# Patient Record
Sex: Male | Born: 1960 | Race: Black or African American | Hispanic: No | Marital: Single | Smoking: Current every day smoker
Health system: Southern US, Community
[De-identification: ages and names within clinical notes are randomized; demographics above are authoritative.]

## PROBLEM LIST (undated history)

## (undated) DIAGNOSIS — G4730 Sleep apnea, unspecified: Secondary | ICD-10-CM

## (undated) DIAGNOSIS — R0601 Orthopnea: Secondary | ICD-10-CM

## (undated) DIAGNOSIS — IMO0001 Reserved for inherently not codable concepts without codable children: Secondary | ICD-10-CM

## (undated) DIAGNOSIS — R609 Edema, unspecified: Secondary | ICD-10-CM

## (undated) DIAGNOSIS — R05 Cough: Secondary | ICD-10-CM

## (undated) DIAGNOSIS — R062 Wheezing: Secondary | ICD-10-CM

## (undated) DIAGNOSIS — M1990 Unspecified osteoarthritis, unspecified site: Secondary | ICD-10-CM

## (undated) DIAGNOSIS — Z972 Presence of dental prosthetic device (complete) (partial): Secondary | ICD-10-CM

## (undated) DIAGNOSIS — M4322 Fusion of spine, cervical region: Secondary | ICD-10-CM

## (undated) DIAGNOSIS — G510 Bell's palsy: Secondary | ICD-10-CM

## (undated) DIAGNOSIS — H9190 Unspecified hearing loss, unspecified ear: Secondary | ICD-10-CM

## (undated) DIAGNOSIS — I10 Essential (primary) hypertension: Secondary | ICD-10-CM

## (undated) DIAGNOSIS — E119 Type 2 diabetes mellitus without complications: Secondary | ICD-10-CM

## (undated) DIAGNOSIS — N400 Enlarged prostate without lower urinary tract symptoms: Secondary | ICD-10-CM

## (undated) DIAGNOSIS — I639 Cerebral infarction, unspecified: Secondary | ICD-10-CM

## (undated) DIAGNOSIS — J45909 Unspecified asthma, uncomplicated: Secondary | ICD-10-CM

## (undated) DIAGNOSIS — R059 Cough, unspecified: Secondary | ICD-10-CM

## (undated) HISTORY — PX: BACK SURGERY: SHX140

## (undated) HISTORY — PX: PILONIDAL CYST EXCISION: SHX744

## (undated) HISTORY — PX: NECK MASS EXCISION: SHX2079

## (undated) HISTORY — PX: COLONOSCOPY: SHX174

---

## 2010-12-10 DIAGNOSIS — R079 Chest pain, unspecified: Secondary | ICD-10-CM | POA: Insufficient documentation

## 2013-07-14 ENCOUNTER — Ambulatory Visit: Payer: Self-pay | Admitting: Family Medicine

## 2014-05-23 DIAGNOSIS — N529 Male erectile dysfunction, unspecified: Secondary | ICD-10-CM | POA: Insufficient documentation

## 2014-05-23 DIAGNOSIS — N138 Other obstructive and reflux uropathy: Secondary | ICD-10-CM | POA: Insufficient documentation

## 2014-05-23 DIAGNOSIS — R972 Elevated prostate specific antigen [PSA]: Secondary | ICD-10-CM | POA: Insufficient documentation

## 2015-11-29 ENCOUNTER — Emergency Department: Payer: Medicaid Other

## 2015-11-29 ENCOUNTER — Encounter: Payer: Self-pay | Admitting: Emergency Medicine

## 2015-11-29 ENCOUNTER — Emergency Department
Admission: EM | Admit: 2015-11-29 | Discharge: 2015-11-29 | Disposition: A | Discharge status: Home / Self Care | Payer: Medicaid Other | Attending: Emergency Medicine | Admitting: Emergency Medicine

## 2015-11-29 DIAGNOSIS — Y9389 Activity, other specified: Secondary | ICD-10-CM | POA: Insufficient documentation

## 2015-11-29 DIAGNOSIS — Y999 Unspecified external cause status: Secondary | ICD-10-CM | POA: Diagnosis not present

## 2015-11-29 DIAGNOSIS — E119 Type 2 diabetes mellitus without complications: Secondary | ICD-10-CM | POA: Insufficient documentation

## 2015-11-29 DIAGNOSIS — T07XXXA Unspecified multiple injuries, initial encounter: Secondary | ICD-10-CM

## 2015-11-29 DIAGNOSIS — S0990XA Unspecified injury of head, initial encounter: Secondary | ICD-10-CM | POA: Diagnosis present

## 2015-11-29 DIAGNOSIS — F17210 Nicotine dependence, cigarettes, uncomplicated: Secondary | ICD-10-CM | POA: Diagnosis not present

## 2015-11-29 DIAGNOSIS — T148 Other injury of unspecified body region: Secondary | ICD-10-CM | POA: Insufficient documentation

## 2015-11-29 DIAGNOSIS — Y929 Unspecified place or not applicable: Secondary | ICD-10-CM | POA: Insufficient documentation

## 2015-11-29 DIAGNOSIS — I10 Essential (primary) hypertension: Secondary | ICD-10-CM | POA: Insufficient documentation

## 2015-11-29 DIAGNOSIS — W07XXXA Fall from chair, initial encounter: Secondary | ICD-10-CM | POA: Insufficient documentation

## 2015-11-29 HISTORY — DX: Type 2 diabetes mellitus without complications: E11.9

## 2015-11-29 HISTORY — DX: Essential (primary) hypertension: I10

## 2015-11-29 LAB — CBC WITH DIFFERENTIAL/PLATELET
BASOS ABS: 0 10*3/uL (ref 0–0.1)
Basophils Relative: 1 %
Eosinophils Absolute: 0.2 10*3/uL (ref 0–0.7)
Eosinophils Relative: 4 %
HEMATOCRIT: 38.4 % — AB (ref 40.0–52.0)
HEMOGLOBIN: 13 g/dL (ref 13.0–18.0)
LYMPHS PCT: 36 %
Lymphs Abs: 2 10*3/uL (ref 1.0–3.6)
MCH: 29.9 pg (ref 26.0–34.0)
MCHC: 33.8 g/dL (ref 32.0–36.0)
MCV: 88.5 fL (ref 80.0–100.0)
MONO ABS: 0.8 10*3/uL (ref 0.2–1.0)
Monocytes Relative: 14 %
NEUTROS ABS: 2.6 10*3/uL (ref 1.4–6.5)
NEUTROS PCT: 45 %
Platelets: 198 10*3/uL (ref 150–440)
RBC: 4.34 MIL/uL — ABNORMAL LOW (ref 4.40–5.90)
RDW: 14.4 % (ref 11.5–14.5)
WBC: 5.6 10*3/uL (ref 3.8–10.6)

## 2015-11-29 LAB — COMPREHENSIVE METABOLIC PANEL
ALT: 28 U/L (ref 17–63)
AST: 24 U/L (ref 15–41)
Albumin: 3.8 g/dL (ref 3.5–5.0)
Alkaline Phosphatase: 59 U/L (ref 38–126)
Anion gap: 6 (ref 5–15)
BILIRUBIN TOTAL: 0.6 mg/dL (ref 0.3–1.2)
BUN: 11 mg/dL (ref 6–20)
CO2: 27 mmol/L (ref 22–32)
CREATININE: 1.08 mg/dL (ref 0.61–1.24)
Calcium: 9.7 mg/dL (ref 8.9–10.3)
Chloride: 105 mmol/L (ref 101–111)
GFR calc Af Amer: >60 mL/min (ref >60)
Glucose, Bld: 79 mg/dL (ref 65–99)
Potassium: 4.7 mmol/L (ref 3.5–5.1)
Sodium: 138 mmol/L (ref 135–145)
TOTAL PROTEIN: 7.5 g/dL (ref 6.5–8.1)

## 2015-11-29 LAB — URINALYSIS COMPLETE WITH MICROSCOPIC (ARMC ONLY)
BILIRUBIN URINE: NEGATIVE
Bacteria, UA: NONE SEEN
GLUCOSE, UA: NEGATIVE mg/dL
Hgb urine dipstick: NEGATIVE
Ketones, ur: NEGATIVE mg/dL
Leukocytes, UA: NEGATIVE
NITRITE: NEGATIVE
Protein, ur: NEGATIVE mg/dL
RBC / HPF: NONE SEEN RBC/hpf (ref 0–5)
SPECIFIC GRAVITY, URINE: 1.014 (ref 1.005–1.030)
Squamous Epithelial / LPF: NONE SEEN
pH: 6 (ref 5.0–8.0)

## 2015-11-29 LAB — LIPASE, BLOOD: Lipase: 19 U/L (ref 11–51)

## 2015-11-29 MED ORDER — OXYCODONE-ACETAMINOPHEN 5-325 MG PO TABS
1.0000 | ORAL_TABLET | Freq: Once | ORAL | Status: AC
  Administered 2015-11-29: 1 via ORAL

## 2015-11-29 MED ORDER — OXYCODONE-ACETAMINOPHEN 5-325 MG PO TABS: 1.0000 | ORAL_TABLET | Freq: Four times a day (QID) | ORAL | Status: DC | PRN

## 2015-11-29 MED ORDER — OXYCODONE-ACETAMINOPHEN 5-325 MG PO TABS
ORAL_TABLET | ORAL | Status: AC
  Administered 2015-11-29: 1 via ORAL
  Filled 2015-11-29: qty 1

## 2015-11-29 NOTE — ED Provider Notes (Signed)
[facility], [facility] Emergency Department Provider Note   ____________________________________________  Time seen: Approximately 6:20 PM  I have reviewed the triage vital signs and the nursing notes.   HISTORY  Chief Complaint Fall and Chest Pain    HPI Juan Harper is a 55 y.o. male who reports this morning about 10 or 11:00 he was sitting in a chair to chair broke and fell apart and he fell down and hit his head on the refrigerator. She complains of a headache she reports she has chronic neck and back pain and patient nerves. This is all somewhat worse. He also complains of pain in the right side of his chest since the fall hurts to push on his chest. He also complains of pain in the right upper quadrant of his abdomen since the fall. He has a history of hypertension diabetes as well. He is not short of breath does not have any chest tightness only pain in the ribs on the right side where he fell and hit his ribs. His low back pain is slightly worse than usual as well.He does not report any new numbness or weakness in his legs and incontinence or any other complaints   Past Medical History  Diagnosis Date  . Diabetes mellitus without complication (HCC)   . Hypertension     There are no active problems to display for this patient.   History reviewed. No pertinent past surgical history.  Current Outpatient Rx  Name  Route  Sig  Dispense  Refill  . oxyCODONE-acetaminophen (ROXICET) 5-325 MG tablet   Oral   Take 1 tablet by mouth every 6 (six) hours as needed.   20 tablet   0     Allergies Review of patient's allergies indicates no known allergies.  No family history on file.  Social History Social History  Substance Use Topics  . Smoking status: Current Every Day Smoker -- 0.50 packs/day    Types: Cigarettes  . Smokeless tobacco: None  . Alcohol Use: No    Review of Systems Constitutional: No fever/chills Eyes: No visual changes. ENT: No  sore throat. See history of present illness Respiratory: Denies shortness of breath. Gastrointestinal: No abdominal pain.  No nausea, no vomiting.  No diarrhea.  No constipation. Genitourinary: Negative for dysuria. Musculoskeletal: See history of present illness Skin: Negative for rash. Neurological: Negative for headaches, focal weakness or numbness.  10-point ROS otherwise negative.  ____________________________________________   PHYSICAL EXAM:  VITAL SIGNS: ED Triage Vitals  Enc Vitals Group     BP 11/29/15 1745 146/73 mmHg     Pulse Rate 11/29/15 1745 97     Resp 11/29/15 1745 18     Temp 11/29/15 1745 98.6 F (37 C)     Temp src --      SpO2 11/29/15 1745 97 %     Weight 11/29/15 1745 230 lb (104.327 kg)     Height 11/29/15 1745 5\' 11"  (1.803 m)     Head Cir --      Peak Flow --      Pain Score 11/29/15 1746 9     Pain Loc --      Pain Edu? --      Excl. in GC? --     Constitutional: Alert and oriented. Well appearing and in no acute distress. Eyes: Conjunctivae are normal. PERRL. EOMI. Head: Atraumatic. Nose: No congestion/rhinnorhea. Mouth/Throat: Mucous membranes are moist.  Oropharynx non-erythematous. Neck: No stridor.   Cardiovascular: Normal rate,  regular rhythm. Grossly normal heart sounds.  Good peripheral circulation. Respiratory: Normal respiratory effort.  No retractions. Lungs CTAB. There is some mild pain on palpation right side of the chest Gastrointestinal: Soft and nontender except for mild tenderness in the right upper quadrant of the abdomen to palpation there is no bruiing. No distention. No abdominal bruits. No CVA tenderness. Musculoskeletal: No lower extremity tenderness nor edema.  No joint effusions. Patient reports bilateral leg numbness from his diabetes Neurologic:  Normal speech and language. No gross focal neurologic deficits are appreciated. No gait instability. Skin:  Skin is warm, dry and intact. No rash noted. Psychiatric: Mood  and affect are normal. Speech and behavior are normal.  ____________________________________________   LABS (all labs ordered are listed, but only abnormal results are displayed)  Labs Reviewed  CBC WITH DIFFERENTIAL/PLATELET - Abnormal; Notable for the following:    RBC 4.34 (*)    HCT 38.4 (*)    All other components within normal limits  URINALYSIS COMPLETEWITH MICROSCOPIC ([facility] ONLY) - Abnormal; Notable for the following:    Color, Urine YELLOW (*)    APPearance CLEAR (*)    All other components within normal limits  COMPREHENSIVE METABOLIC PANEL  LIPASE, BLOOD   ____________________________________________  EKG  EKG read and interpreted by me shows normal sinus rhythm rate of 93 normal axis no acute ST-T wave changes ____________________________________________  RADIOLOGY  CT head and neck show no acute pathology per radiology Lumbar spine shows no acute pathology per radiology Ultrasound of the abdomen shows no acute pathology per radiology Chest x-ray shows no acute pathology per radiology  ____________________________________________   PROCEDURES    Procedures    ____________________________________________   INITIAL IMPRESSION / ASSESSMENT AND PLAN / ED COURSE  Pertinent labs & imaging results that were available during my care of the patient were reviewed by me and considered in my medical decision making (see chart for details).   ____________________________________________   FINAL CLINICAL IMPRESSION(S) / ED DIAGNOSES  Final diagnoses:  Multiple contusions      NEW MEDICATIONS STARTED DURING THIS VISIT:  Discharge Medication List as of 11/29/2015 10:23 PM    START taking these medications   Details  oxyCODONE-acetaminophen (ROXICET) 5-325 MG tablet Take 1 tablet by mouth every 6 (six) hours as needed., Starting 11/29/2015, Until Fri 11/28/16, Print         Note:  This document was prepared using Dragon voice recognition software and  may include unintentional dictation errors.    Arnaldo NatalPaul F Alyra Patty, MD 11/30/15 [phone]

## 2015-11-29 NOTE — ED Notes (Signed)
Pt presents to ED with reports of falling this morning when the chair he was sitting in broke. Pt states he fell to the floor and hit his head on the refrigerator. Pt denies LOC. Pt presents with reports of pain in multiple areas of his body, mainly on the right side. Pt reports right side chest pain with shortness of breath.

## 2015-11-29 NOTE — ED Notes (Signed)
bllod obtained from right ac.

## 2016-02-01 ENCOUNTER — Encounter: Payer: Self-pay | Admitting: *Deleted

## 2016-02-07 ENCOUNTER — Ambulatory Visit: Admit: 2016-02-07 | Payer: Medicaid Other | Admitting: Ophthalmology

## 2016-02-07 DIAGNOSIS — S2242XA Multiple fractures of ribs, left side, initial encounter for closed fracture: Secondary | ICD-10-CM | POA: Insufficient documentation

## 2016-02-07 HISTORY — DX: Reserved for inherently not codable concepts without codable children: IMO0001

## 2016-02-07 HISTORY — DX: Sleep apnea, unspecified: G47.30

## 2016-02-07 HISTORY — DX: Cough, unspecified: R05.9

## 2016-02-07 HISTORY — DX: Bell's palsy: G51.0

## 2016-02-07 HISTORY — DX: Unspecified osteoarthritis, unspecified site: M19.90

## 2016-02-07 HISTORY — DX: Wheezing: R06.2

## 2016-02-07 HISTORY — DX: Edema, unspecified: R60.9

## 2016-02-07 HISTORY — DX: Orthopnea: R06.01

## 2016-02-07 HISTORY — DX: Cough: R05

## 2016-02-07 SURGERY — PHACOEMULSIFICATION, CATARACT, WITH IOL INSERTION
Anesthesia: Choice | Laterality: Right

## 2016-07-16 DIAGNOSIS — J410 Simple chronic bronchitis: Secondary | ICD-10-CM | POA: Insufficient documentation

## 2016-07-16 DIAGNOSIS — Z720 Tobacco use: Secondary | ICD-10-CM | POA: Insufficient documentation

## 2016-07-22 ENCOUNTER — Encounter: Payer: Self-pay | Admitting: *Deleted

## 2016-07-31 ENCOUNTER — Ambulatory Visit
Admission: RE | Admit: 2016-07-31 | Discharge: 2016-07-31 | Disposition: A | Discharge status: Home / Self Care | Payer: Medicaid Other | Source: Ambulatory Visit | Attending: Ophthalmology | Admitting: Ophthalmology

## 2016-07-31 ENCOUNTER — Ambulatory Visit: Payer: Medicaid Other | Admitting: Registered Nurse

## 2016-07-31 ENCOUNTER — Encounter: Payer: Self-pay | Admitting: *Deleted

## 2016-07-31 ENCOUNTER — Encounter
Admission: RE | Disposition: A | Discharge status: Home / Self Care | Payer: Self-pay | Source: Ambulatory Visit | Attending: Ophthalmology

## 2016-07-31 DIAGNOSIS — G4730 Sleep apnea, unspecified: Secondary | ICD-10-CM | POA: Insufficient documentation

## 2016-07-31 DIAGNOSIS — R0601 Orthopnea: Secondary | ICD-10-CM | POA: Diagnosis not present

## 2016-07-31 DIAGNOSIS — E1136 Type 2 diabetes mellitus with diabetic cataract: Secondary | ICD-10-CM | POA: Diagnosis present

## 2016-07-31 DIAGNOSIS — F17200 Nicotine dependence, unspecified, uncomplicated: Secondary | ICD-10-CM | POA: Insufficient documentation

## 2016-07-31 DIAGNOSIS — M1990 Unspecified osteoarthritis, unspecified site: Secondary | ICD-10-CM | POA: Diagnosis not present

## 2016-07-31 DIAGNOSIS — G709 Myoneural disorder, unspecified: Secondary | ICD-10-CM | POA: Insufficient documentation

## 2016-07-31 DIAGNOSIS — I10 Essential (primary) hypertension: Secondary | ICD-10-CM | POA: Insufficient documentation

## 2016-07-31 HISTORY — PX: CATARACT EXTRACTION W/PHACO: SHX586

## 2016-07-31 HISTORY — DX: Benign prostatic hyperplasia without lower urinary tract symptoms: N40.0

## 2016-07-31 HISTORY — DX: Fusion of spine, cervical region: M43.22

## 2016-07-31 LAB — GLUCOSE, CAPILLARY: Glucose-Capillary: 135 mg/dL — ABNORMAL HIGH (ref 65–99)

## 2016-07-31 SURGERY — PHACOEMULSIFICATION, CATARACT, WITH IOL INSERTION
Anesthesia: Monitor Anesthesia Care | Site: Eye | Laterality: Right | Wound class: Clean

## 2016-07-31 MED ORDER — MOXIFLOXACIN HCL 0.5 % OP SOLN
OPHTHALMIC | Status: AC
  Filled 2016-07-31: qty 3

## 2016-07-31 MED ORDER — BSS IO SOLN
INTRAOCULAR | Status: DC | PRN
  Administered 2016-07-31: 200 mL via INTRAOCULAR

## 2016-07-31 MED ORDER — FENTANYL CITRATE (PF) 100 MCG/2ML IJ SOLN
INTRAMUSCULAR | Status: DC | PRN
  Administered 2016-07-31: 50 ug via INTRAVENOUS

## 2016-07-31 MED ORDER — SODIUM HYALURONATE 10 MG/ML IO SOLN
INTRAOCULAR | Status: DC | PRN
  Administered 2016-07-31: 0.55 mL via INTRAOCULAR

## 2016-07-31 MED ORDER — MIDAZOLAM HCL 2 MG/2ML IJ SOLN
INTRAMUSCULAR | Status: DC | PRN
Start: 2016-07-31 — End: 2016-07-31
  Administered 2016-07-31: 1 mg via INTRAVENOUS

## 2016-07-31 MED ORDER — LIDOCAINE HCL (PF) 4 % IJ SOLN
INTRAOCULAR | Status: DC | PRN
  Administered 2016-07-31: 4 mL via OPHTHALMIC

## 2016-07-31 MED ORDER — MOXIFLOXACIN HCL 0.5 % OP SOLN: 1.0000 [drp] | OPHTHALMIC | Status: DC | PRN

## 2016-07-31 MED ORDER — SODIUM HYALURONATE 10 MG/ML IO SOLN
INTRAOCULAR | Status: AC
  Filled 2016-07-31: qty 0.85

## 2016-07-31 MED ORDER — EPINEPHRINE PF 1 MG/ML IJ SOLN
INTRAMUSCULAR | Status: AC
  Filled 2016-07-31: qty 1

## 2016-07-31 MED ORDER — SODIUM HYALURONATE 23 MG/ML IO SOLN
INTRAOCULAR | Status: DC | PRN
  Administered 2016-07-31: 0.6 mL via INTRAOCULAR

## 2016-07-31 MED ORDER — ONDANSETRON HCL 4 MG/2ML IJ SOLN
INTRAMUSCULAR | Status: DC | PRN
  Administered 2016-07-31: 4 mg via INTRAVENOUS

## 2016-07-31 MED ORDER — MIDAZOLAM HCL 2 MG/2ML IJ SOLN
INTRAMUSCULAR | Status: AC
  Filled 2016-07-31: qty 2

## 2016-07-31 MED ORDER — ARMC OPHTHALMIC DILATING DROPS
OPHTHALMIC | Status: AC
  Administered 2016-07-31: 1 via OPHTHALMIC
  Filled 2016-07-31: qty 0.4

## 2016-07-31 MED ORDER — POVIDONE-IODINE 5 % OP SOLN
OPHTHALMIC | Status: AC
  Filled 2016-07-31: qty 30

## 2016-07-31 MED ORDER — FENTANYL CITRATE (PF) 100 MCG/2ML IJ SOLN
INTRAMUSCULAR | Status: AC
  Filled 2016-07-31: qty 2

## 2016-07-31 MED ORDER — SODIUM CHLORIDE 0.9 % IV SOLN
INTRAVENOUS | Status: DC
  Administered 2016-07-31: 08:00:00 via INTRAVENOUS

## 2016-07-31 MED ORDER — ARMC OPHTHALMIC DILATING DROPS
1.0000 "application " | OPHTHALMIC | Status: AC
  Administered 2016-07-31 (×3): 1 via OPHTHALMIC

## 2016-07-31 MED ORDER — SODIUM HYALURONATE 23 MG/ML IO SOLN
INTRAOCULAR | Status: AC
  Filled 2016-07-31: qty 0.6

## 2016-07-31 SURGICAL SUPPLY — 20 items
CANNULA ANT/CHMB 27GA (MISCELLANEOUS) ×6 IMPLANT
CUP MEDICINE 2OZ PLAST GRAD ST (MISCELLANEOUS) ×3 IMPLANT
DISSECTOR HYDRO NUCLEUS 50X22 (MISCELLANEOUS) ×3 IMPLANT
GLOVE BIO SURGEON STRL SZ8 (GLOVE) ×3 IMPLANT
GLOVE BIOGEL M 6.5 STRL (GLOVE) ×3 IMPLANT
GLOVE SURG LX 7.5 STRW (GLOVE) ×2
GLOVE SURG LX STRL 7.5 STRW (GLOVE) ×1 IMPLANT
GOWN STRL REUS W/ TWL LRG LVL3 (GOWN DISPOSABLE) ×2 IMPLANT
GOWN STRL REUS W/TWL LRG LVL3 (GOWN DISPOSABLE) ×4
LENS IOL TECNIS ITEC 20.0 (Intraocular Lens) ×3 IMPLANT
PACK CATARACT (MISCELLANEOUS) ×3 IMPLANT
PACK CATARACT KING (MISCELLANEOUS) ×3 IMPLANT
PACK EYE AFTER SURG (MISCELLANEOUS) ×3 IMPLANT
SOL BSS BAG (MISCELLANEOUS) ×3
SOLUTION BSS BAG (MISCELLANEOUS) ×1 IMPLANT
SYR 3ML LL SCALE MARK (SYRINGE) ×6 IMPLANT
SYR 5ML LL (SYRINGE) ×3 IMPLANT
SYR TB 1ML 27GX1/2 LL (SYRINGE) ×3 IMPLANT
WATER STERILE IRR 250ML POUR (IV SOLUTION) ×3 IMPLANT
WIPE NON LINTING 3.25X3.25 (MISCELLANEOUS) ×3 IMPLANT

## 2016-07-31 NOTE — Transfer of Care (Signed)
Immediate Anesthesia Transfer of Care Note  Patient: [name]  Procedure(s) Performed: Procedure(s) with comments: CATARACT EXTRACTION PHACO AND INTRAOCULAR LENS PLACEMENT (IOC) (Right) - Lot # [id number] H US:00:26.9 AP%:6.8% CDE: 1.83  Patient Location: PHASE II  Anesthesia Type:MAC  Level of Consciousness: Awake, Alert, Oriented  Airway & Oxygen Therapy: Patient Spontanous Breathing and Patient on room air   Post-op Assessment: Report given to RN and Post -op Vital signs reviewed and stable  Post vital signs: Reviewed and stable  Last Vitals:  Vitals:   07/31/16 0944 07/31/16 0945  BP: (!) 142/80 (!) 142/80  Pulse: 72 74  Resp:  18  Temp: 36.7 C 27.7 C    Complications: No apparent anesthesia complications

## 2016-07-31 NOTE — Anesthesia Postprocedure Evaluation (Signed)
Anesthesia Post Note  Patient: Juan Harper  Procedure(s) Performed: Procedure(s) (LRB): CATARACT EXTRACTION PHACO AND INTRAOCULAR LENS PLACEMENT (IOC) (Right)  Patient location during evaluation: PACU Anesthesia Type: MAC Level of consciousness: awake and alert Pain management: pain level controlled Vital Signs Assessment: post-procedure vital signs reviewed and stable Respiratory status: spontaneous breathing, nonlabored ventilation, respiratory function stable and patient connected to nasal cannula oxygen Cardiovascular status: stable and blood pressure returned to baseline Anesthetic complications: no     Last Vitals:  Vitals:   07/31/16 0944 07/31/16 0945  BP: (!) 142/80 (!) 142/80  Pulse: 72 74  Resp:  18  Temp: 36.7 C 36.7 C    Last Pain:  Vitals:   07/31/16 0944  TempSrc: Oral  PainSc:                  Alison Stalling

## 2016-07-31 NOTE — Anesthesia Preprocedure Evaluation (Signed)
Anesthesia Evaluation  Patient identified by MRN, date of birth, ID band Patient awake    Reviewed: Allergy & Precautions, H&P , NPO status , Patient's Chart, lab work & pertinent test results, reviewed documented beta blocker date and time   Airway Mallampati: III  TM Distance: >3 FB Neck ROM: full    Dental no notable dental hx. (+) Teeth Intact   Pulmonary neg pulmonary ROS, shortness of breath, sleep apnea and Continuous Positive Airway Pressure Ventilation , Current Smoker,    Pulmonary exam normal breath sounds clear to auscultation       Cardiovascular Exercise Tolerance: Good hypertension, + Orthopnea  negative cardio ROS   Rhythm:regular Rate:Normal     Neuro/Psych  Neuromuscular disease negative neurological ROS  negative psych ROS   GI/Hepatic negative GI ROS, Neg liver ROS,   Endo/Other  negative endocrine ROSdiabetes  Renal/GU      Musculoskeletal  (+) Arthritis ,   Abdominal   Peds  Hematology negative hematology ROS (+)   Anesthesia Other Findings   Reproductive/Obstetrics negative OB ROS                             Anesthesia Physical Anesthesia Plan  ASA: III  Anesthesia Plan: MAC   Post-op Pain Management:    Induction:   Airway Management Planned:   Additional Equipment:   Intra-op Plan:   Post-operative Plan:   Informed Consent: I have reviewed the patients History and Physical, chart, labs and discussed the procedure including the risks, benefits and alternatives for the proposed anesthesia with the patient or authorized representative who has indicated his/her understanding and acceptance.     Plan Discussed with: CRNA  Anesthesia Plan Comments:         Anesthesia Quick Evaluation

## 2016-07-31 NOTE — H&P (Signed)
The History and Physical notes are on paper, have been signed, and are to be scanned.   I have examined the patient and there are no changes to the H&P.   Willey BladeBradley Shaketta Rill 07/31/2016 9:05 AM

## 2016-07-31 NOTE — Discharge Instructions (Signed)
Eye Surgery Discharge Instructions  Expect mild scratchy sensation or mild soreness. DO NOT RUB YOUR EYE!  The day of surgery:  Minimal physical activity, but bed rest is not required  No reading, computer work, or close hand work  No bending, lifting, or straining.  May watch TV  For 24 hours:  No driving, legal decisions, or alcoholic beverages  Safety precautions  Eat anything you prefer: It is better to start with liquids, then soup then solid foods.  _____ Eye patch should be worn until postoperative exam tomorrow.  ____ Solar shield eyeglasses should be worn for comfort in the sunlight/patch while sleeping  Resume all regular medications including aspirin or Coumadin if these were discontinued prior to surgery. You may shower, bathe, shave, or wash your hair. Tylenol may be taken for mild discomfort.  Call your doctor if you experience significant pain, nausea, or vomiting, fever > 101 or other signs of infection. [phone] or [phone] Specific instructions:  Follow-up Information    Willey BladeBradley King, MD Follow up.   Specialty:  Ophthalmology Why:  March 9 at 10:00am Contact information: [address] [place] [address] [phone]

## 2016-07-31 NOTE — Anesthesia Post-op Follow-up Note (Cosign Needed)
Anesthesia QCDR form completed.        

## 2016-07-31 NOTE — Op Note (Signed)
OPERATIVE NOTE  Juan RouseHenry E Kisling [id number] 07/31/2016   PREOPERATIVE DIAGNOSIS:  Nuclear sclerotic cataract right eye.  H25.11   POSTOPERATIVE DIAGNOSIS:    Nuclear sclerotic cataract right eye.     PROCEDURE:  Phacoemusification with posterior chamber intraocular lens placement of the right eye   LENS:   Implant Name Type Inv. Item Serial No. Manufacturer Lot No. LRB No. Used  LENS IOL DIOP 20.0 - [id number] Intraocular Lens LENS IOL DIOP 20.0 [id number] AMO   Right 1       PCB00 +20.0   ULTRASOUND TIME: 0 minutes 26.9 seconds.  CDE 1.83   SURGEON:  Willey BladeBradley Gedalya Jim, MD, MPH  ANESTHESIOLOGIST: Anesthesiologist: Yevette EdwardsJames G Adams, MD CRNA: Stormy FabianLinda Curtis, CRNA   ANESTHESIA:  Topical with tetracaine drops augmented with 1% preservative-free intracameral lidocaine.  ESTIMATED BLOOD LOSS: less than 1 mL.   COMPLICATIONS:  None.   DESCRIPTION OF PROCEDURE:  The patient was identified in the holding room and transported to the operating room and placed in the supine position under the operating microscope.  The right eye was identified as the operative eye and it was prepped and draped in the usual sterile ophthalmic fashion.   A 1.0 millimeter clear-corneal paracentesis was made at the 10:30 position. 0.5 ml of preservative-free 1% lidocaine with epinephrine was injected into the anterior chamber.  The anterior chamber was filled with Healon 5 viscoelastic.  A 2.4 millimeter keratome was used to make a near-clear corneal incision at the 8:00 position.  A curvilinear capsulorrhexis was made with a cystotome and capsulorrhexis forceps.  Balanced salt solution was used to hydrodissect and hydrodelineate the nucleus.   Phacoemulsification was then used in stop and chop fashion to remove the lens nucleus and epinucleus.  The remaining cortex was then removed using the irrigation and aspiration handpiece. Healon was then placed into the capsular bag to distend it for lens placement.  A lens was then  injected into the capsular bag.  The remaining viscoelastic was aspirated.   Wounds were hydrated with balanced salt solution.  The anterior chamber was inflated to a physiologic pressure with balanced salt solution.   Intracameral vigamox 0.1 mL undiluted was injected into the eye and a drop placed onto the ocular surface.  No wound leaks were noted.  The patient was taken to the recovery room in stable condition without complications of anesthesia or surgery  Willey BladeBradley Keval Nam 07/31/2016, 9:41 AM

## 2016-07-31 NOTE — Anesthesia Procedure Notes (Signed)
Procedure Name: MAC Date/Time: 07/31/2016 9:10 AM Performed by: Doreen Salvage Pre-anesthesia Checklist: Patient identified, Emergency Drugs available, Suction available and Patient being monitored Patient Re-evaluated:Patient Re-evaluated prior to inductionOxygen Delivery Method: Nasal cannula

## 2016-09-05 ENCOUNTER — Emergency Department
Admission: EM | Admit: 2016-09-05 | Discharge: 2016-09-05 | Disposition: A | Discharge status: Home / Self Care | Payer: Medicaid Other | Attending: Student in an Organized Health Care Education/Training Program | Admitting: Student in an Organized Health Care Education/Training Program

## 2016-09-05 ENCOUNTER — Encounter: Payer: Self-pay | Admitting: Emergency Medicine

## 2016-09-05 DIAGNOSIS — E860 Dehydration: Secondary | ICD-10-CM | POA: Diagnosis not present

## 2016-09-05 DIAGNOSIS — F17210 Nicotine dependence, cigarettes, uncomplicated: Secondary | ICD-10-CM | POA: Insufficient documentation

## 2016-09-05 DIAGNOSIS — Z7982 Long term (current) use of aspirin: Secondary | ICD-10-CM | POA: Insufficient documentation

## 2016-09-05 DIAGNOSIS — I10 Essential (primary) hypertension: Secondary | ICD-10-CM | POA: Diagnosis not present

## 2016-09-05 DIAGNOSIS — E1165 Type 2 diabetes mellitus with hyperglycemia: Secondary | ICD-10-CM | POA: Insufficient documentation

## 2016-09-05 DIAGNOSIS — Z7984 Long term (current) use of oral hypoglycemic drugs: Secondary | ICD-10-CM | POA: Insufficient documentation

## 2016-09-05 DIAGNOSIS — Z79899 Other long term (current) drug therapy: Secondary | ICD-10-CM | POA: Diagnosis not present

## 2016-09-05 DIAGNOSIS — R739 Hyperglycemia, unspecified: Secondary | ICD-10-CM

## 2016-09-05 LAB — CBC
HCT: 47.6 % (ref 40.0–52.0)
Hemoglobin: 16 g/dL (ref 13.0–18.0)
MCH: 29.2 pg (ref 26.0–34.0)
MCHC: 33.6 g/dL (ref 32.0–36.0)
MCV: 86.8 fL (ref 80.0–100.0)
PLATELETS: 272 10*3/uL (ref 150–440)
RBC: 5.49 MIL/uL (ref 4.40–5.90)
RDW: 13.7 % (ref 11.5–14.5)
WBC: 7.5 10*3/uL (ref 3.8–10.6)

## 2016-09-05 LAB — URINALYSIS, COMPLETE (UACMP) WITH MICROSCOPIC
BILIRUBIN URINE: NEGATIVE
Bacteria, UA: NONE SEEN
HGB URINE DIPSTICK: NEGATIVE
Ketones, ur: NEGATIVE mg/dL
Leukocytes, UA: NEGATIVE
NITRITE: NEGATIVE
PH: 6 (ref 5.0–8.0)
Protein, ur: NEGATIVE mg/dL
SQUAMOUS EPITHELIAL / LPF: NONE SEEN
Specific Gravity, Urine: 1.027 (ref 1.005–1.030)

## 2016-09-05 LAB — GLUCOSE, CAPILLARY
GLUCOSE-CAPILLARY: 574 mg/dL — AB (ref 65–99)
GLUCOSE-CAPILLARY: 296 mg/dL — AB (ref 65–99)

## 2016-09-05 LAB — BASIC METABOLIC PANEL
Anion gap: 10 (ref 5–15)
BUN: 29 mg/dL — ABNORMAL HIGH (ref 6–20)
CHLORIDE: 88 mmol/L — AB (ref 101–111)
CO2: 25 mmol/L (ref 22–32)
CREATININE: 1.62 mg/dL — AB (ref 0.61–1.24)
Calcium: 9.5 mg/dL (ref 8.9–10.3)
GFR, EST AFRICAN AMERICAN: 53 mL/min — AB (ref >60)
GFR, EST NON AFRICAN AMERICAN: 46 mL/min — AB (ref >60)
Glucose, Bld: 636 mg/dL (ref 65–99)
POTASSIUM: 4.4 mmol/L (ref 3.5–5.1)
SODIUM: 123 mmol/L — AB (ref 135–145)

## 2016-09-05 LAB — OSMOLALITY: OSMOLALITY: 296 mosm/kg — AB (ref 275–295)

## 2016-09-05 MED ORDER — SODIUM CHLORIDE 0.9 % IV BOLUS (SEPSIS)
1000.0000 mL | Freq: Once | INTRAVENOUS | Status: AC
  Administered 2016-09-05: 1000 mL via INTRAVENOUS

## 2016-09-05 MED ORDER — METFORMIN HCL 500 MG PO TABS
1000.0000 mg | ORAL_TABLET | Freq: Once | ORAL | Status: AC
  Administered 2016-09-05: 1000 mg via ORAL
  Filled 2016-09-05: qty 2

## 2016-09-05 MED ORDER — INSULIN ASPART 100 UNIT/ML ~~LOC~~ SOLN: 5.0000 [IU] | Freq: Once | SUBCUTANEOUS | Status: DC

## 2016-09-05 MED ORDER — GLIPIZIDE 5 MG PO TABS
5.0000 mg | ORAL_TABLET | Freq: Every day | ORAL | Status: DC
  Filled 2016-09-05: qty 1

## 2016-09-05 MED ORDER — GLIPIZIDE 5 MG PO TABS: 5.0000 mg | ORAL_TABLET | Freq: Two times a day (BID) | ORAL | 0 refills | Status: DC

## 2016-09-05 MED ORDER — INSULIN ASPART 100 UNIT/ML ~~LOC~~ SOLN
5.0000 [IU] | Freq: Once | SUBCUTANEOUS | Status: AC
  Administered 2016-09-05: 5 [IU] via INTRAVENOUS
  Filled 2016-09-05: qty 5

## 2016-09-05 MED ORDER — METFORMIN HCL 500 MG PO TABS: 1000.0000 mg | ORAL_TABLET | Freq: Two times a day (BID) | ORAL | 0 refills | Status: DC

## 2016-09-05 MED ORDER — SODIUM CHLORIDE 0.9 % IV BOLUS (SEPSIS): 1000.0000 mL | Freq: Once | INTRAVENOUS | Status: DC

## 2016-09-05 NOTE — ED Notes (Signed)
Called in patient's prescription to [facility] in [place], spoke with [place].

## 2016-09-05 NOTE — ED Notes (Signed)
Patient reports he has no money on him, but will be able to go pick up his prescriptions in the morning. MD made aware and states that patient has been given nightly dose of medications and will be okay to begin prescriptions in the morning. Patient verbalized understanding that he will need to begin medication again in the morning.

## 2016-09-05 NOTE — ED Triage Notes (Signed)
Patient from home via [facility]. Reports he has been out of his metformin for several weeks. Reports he has no family to take him to pick up his medication.  States yesterday his CBG was 550, today his monitor read HIGH. Patient reports he is feeling weaker than normal and has some blurred vision. Also complaining of excessive thirst. Patient is A&O x4.

## 2016-09-05 NOTE — ED Provider Notes (Signed)
[facility] Emergency Department Provider Note    First MD Initiated Contact with Patient 09/05/16 1405     (approximate)  I have reviewed the triage vital signs and the nursing notes.   HISTORY  Chief Complaint Hyperglycemia    HPI Juan Harper is a 56 y.o. male history of type 2 diabetes presents with increasing blood glucose generalized weakness, increased thirst and polyuria for the past several weeks. Patient ran out of his metformin several weeks ago.Denies any fevers.  No nausea or vomiting. Patient states that he's not been able to get his metformin as he does not have any transportation to the pharmacy.  States he has chronic back pain unchanged from previous. States [place] has [place] well [place]   Past Medical History:  Diagnosis Date  . Arthritis    NECK/ RIGHT KNEE  . Bell's palsy   . BPH (benign prostatic hyperplasia)   . Cough    chronic  . Diabetes mellitus without complication (HCC)   . Edema    legs/feet  . 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 Hypertension   . Orthopnea   . Shortness of breath dyspnea   . Sleep apnea    cpap  . Wheezing    No family history on file. Past Surgical History:  Procedure Laterality Date  . BACK SURGERY    . CATARACT EXTRACTION W/PHACO Right 07/31/2016   Procedure: CATARACT EXTRACTION PHACO AND INTRAOCULAR LENS PLACEMENT (IOC);  Surgeon: Nevada Crane, MD;  Location: [facility] ORS;  Service: Ophthalmology;  Laterality: Right;  Lot # [id number] H US:00:26.9 AP%:6.8% CDE: 1.83  . COLONOSCOPY    . NECK MASS EXCISION    . PILONIDAL CYST EXCISION     There are no active problems to display for this patient.     Prior to Admission medications   Medication Sig Start Date End Date Taking? Authorizing Provider  acetaminophen (TYLENOL) 325 MG tablet Take 650 mg by mouth 3 (three) times daily.   Yes Historical Provider, MD  albuterol (PROVENTIL  HFA;VENTOLIN HFA) 108 (90 Base) MCG/ACT inhaler Inhale 2 puffs into the lungs every 6 (six) hours as needed for wheezing or shortness of breath.   Yes Historical Provider, MD  aspirin EC 81 MG tablet Take 81 mg by mouth daily.   Yes Historical Provider, MD  gabapentin (NEURONTIN) 300 MG capsule Take 600 mg by mouth 2 (two) times daily.    Yes Historical Provider, MD  ipratropium (ATROVENT HFA) 17 MCG/ACT inhaler Inhale into the lungs 2 (two) times daily.   Yes Historical Provider, MD  lisinopril-hydrochlorothiazide (PRINZIDE,ZESTORETIC) 20-25 MG tablet Take 1 tablet by mouth daily.   Yes Historical Provider, MD  loratadine (CLARITIN) 10 MG tablet Take 10 mg by mouth daily.   Yes Historical Provider, MD  lovastatin (MEVACOR) 20 MG tablet Take 20 mg by mouth at bedtime.   Yes Historical Provider, MD  oxycodone (OXY-IR) 5 MG capsule Take 5 mg by mouth every 6 (six) hours as needed.   Yes Historical Provider, MD  sitaGLIPtin (JANUVIA) 100 MG tablet Take 100 mg by mouth daily.   Yes Historical Provider, MD  tamsulosin (FLOMAX) 0.4 MG CAPS capsule Take 0.4 mg by mouth.   Yes Historical Provider, MD  glipiZIDE (GLUCOTROL) 5 MG tablet Take 1 tablet (5 mg total) by mouth 2 (two) times daily before a meal. 09/05/16   Willy Eddy, MD  metFORMIN (GLUCOPHAGE) 500 MG tablet Take 2 tablets (1,000 mg total) by mouth 2 (two) times daily. 09/05/16   Willy Eddy, MD    Allergies Patient has no known allergies.    Social History Social History  Substance Use Topics  . Smoking status: Current Every Day Smoker    Packs/day: 0.50    Types: Cigarettes  . Smokeless tobacco: Never Used  . Alcohol use No    Review of Systems Patient denies headaches, rhinorrhea, blurry vision, numbness, shortness of breath, chest pain, edema, cough, abdominal pain, nausea, vomiting, diarrhea, dysuria, fevers, rashes or hallucinations unless otherwise stated above in  HPI. ____________________________________________   PHYSICAL EXAM:  VITAL SIGNS: Vitals:   09/05/16 1530 09/05/16 1600  BP: 113/79 (!) 125/93  Pulse: 98 97  Resp: (!) 29 19  Temp:      Constitutional: Alert and oriented. Well appearing and in no acute distress. Eyes: Conjunctivae are normal. PERRL. EOMI. Head: Atraumatic. Nose: No congestion/rhinnorhea. Mouth/Throat: Mucous membranes are moist.  Oropharynx non-erythematous. Neck: No stridor. Painless ROM. No cervical spine tenderness to palpation Hematological/Lymphatic/Immunilogical: No cervical lymphadenopathy. Cardiovascular: Normal rate, regular rhythm. Grossly normal heart sounds.  Good peripheral circulation. Respiratory: Normal respiratory effort.  No retractions. Lungs CTAB. Gastrointestinal: Soft and nontender. No distention. No abdominal bruits. No CVA tenderness. Genitourinary:  Musculoskeletal: No lower extremity tenderness nor edema.  No joint effusions. Neurologic:  Normal speech and language. No gross focal neurologic deficits are appreciated. No gait instability. Skin:  Skin is warm, dry and intact. No rash noted. Psychiatric: Mood and affect are normal. Speech and behavior are normal.  ____________________________________________   LABS (all labs ordered are listed, but only abnormal results are displayed)  Results for orders placed or performed during the hospital encounter of 09/05/16 (from the past 24 hour(s))  Basic metabolic panel     Status: Abnormal   Collection Time: 09/05/16  2:06 PM  Result Value Ref Range   Sodium 123 (L) 135 - 145 mmol/L   Potassium 4.4 3.5 - 5.1 mmol/L   Chloride 88 (L) 101 - 111 mmol/L   CO2 25 22 - 32 mmol/L   Glucose, Bld 636 (HH) 65 - 99 mg/dL   BUN 29 (H) 6 - 20 mg/dL   Creatinine, Ser 1.61 (H) 0.61 - 1.24 mg/dL   Calcium 9.5 8.9 - 09.6 mg/dL   GFR calc non Af Amer 46 (L) >60 mL/min   GFR calc Af Amer 53 (L) >60 mL/min   Anion gap 10 5 - 15  CBC     Status: None    Collection Time: 09/05/16  2:06 PM  Result Value Ref Range   WBC 7.5 3.8 - 10.6 K/uL   RBC 5.49 4.40 - 5.90 MIL/uL   Hemoglobin 16.0 13.0 - 18.0 g/dL   HCT 04.5 40.9 - 81.1 %   MCV 86.8 80.0 - 100.0 fL   MCH 29.2 26.0 - 34.0 pg   MCHC 33.6 32.0 - 36.0 g/dL   RDW 91.4 78.2 - 95.6 %   Platelets 272 150 - 440 K/uL  Osmolality     Status: Abnormal   Collection Time: 09/05/16  2:06 PM  Result Value Ref Range   Osmolality 296 (H) 275 - 295 mOsm/kg  Glucose, capillary     Status: Abnormal   Collection Time: 09/05/16  2:09 PM  Result Value Ref Range   Glucose-Capillary 574 (HH) 65 - 99 mg/dL   Comment 1 Call MD NNP PA CNM   Urinalysis, Complete  w Microscopic     Status: Abnormal   Collection Time: 09/05/16  3:11 PM  Result Value Ref Range   Color, Urine STRAW (A) YELLOW   APPearance CLEAR (A) CLEAR   Specific Gravity, Urine 1.027 1.005 - 1.030   pH 6.0 5.0 - 8.0   Glucose, UA >=500 (A) NEGATIVE mg/dL   Hgb urine dipstick NEGATIVE NEGATIVE   Bilirubin Urine NEGATIVE NEGATIVE   Ketones, ur NEGATIVE NEGATIVE mg/dL   Protein, ur NEGATIVE NEGATIVE mg/dL   Nitrite NEGATIVE NEGATIVE   Leukocytes, UA NEGATIVE NEGATIVE   RBC / HPF 0-5 0 - 5 RBC/hpf   WBC, UA 0-5 0 - 5 WBC/hpf   Bacteria, UA NONE SEEN NONE SEEN   Squamous Epithelial / LPF NONE SEEN NONE SEEN   ____________________________________________  ____________________________________________  RADIOLOGY   ____________________________________________   PROCEDURES  Procedure(s) performed:  Procedures    Critical Care performed: no ____________________________________________   INITIAL IMPRESSION / ASSESSMENT AND PLAN / ED COURSE  Pertinent labs & imaging results that were available during my care of the patient were reviewed by me and considered in my medical decision making (see chart for details).  DDX: hyperglycemia, hhns, dka, dehydration  Juan Harper is a 56 y.o. who presents to the ED with *Elevated blood  glucose not taking his home medications for the past several weeks.  Patient is AFVSS in ED. Exam as above. Given current presentation have considered the above differential. We'll provide fluids. Patient is on NSAIDs insulin therefore less likely DKA but we'll check blood work to evaluate for any evidence of significant acidosis. Patient without any sort of encephalopathy or signs or symptoms to suggest HHnS but his blood sugar is elevated enough that we will evaluate for significant dehydration and hyper osmolality.  The patient will be placed on continuous pulse oximetry and telemetry for monitoring.  Laboratory evaluation will be sent to evaluate for the above complaints.     Clinical Course as of Sep 05 1709  Fri Sep 05, 2016  1633 Nitrite: NEGATIVE [PR]  1647 Blood sugar is declining. Serum also laterality is only 1 point above the upper limit of normal. This is clinically not consistent with age H and S however will give additional dose of insulin and additional fluids to properly hydrate patient.  [PR]    Clinical Course User Index [PR] Willy Eddy, MD   ----------------------------------------- 5:12 PM on 09/05/2016 -----------------------------------------  Repeat glucose shows sugar down to the 200s. Patient is tolerating oral hydration. He remains hemodynamic stable. He is well-appearing. I have arranged for patient to get a ride to pharmacy for refill of his metformin including a ride. Do feel patient is stable for close outpatient follow-up with his PCP.  Have discussed with the patient and available family all diagnostics and treatments performed thus far and all questions were answered to the best of my ability. The patient demonstrates understanding and agreement with plan.   ____________________________________________   FINAL CLINICAL IMPRESSION(S) / ED DIAGNOSES  Final diagnoses:  Hyperglycemia  Dehydration      NEW MEDICATIONS STARTED DURING THIS  VISIT:  Current Discharge Medication List       Note:  This document was prepared using Dragon voice recognition software and may include unintentional dictation errors.    Willy Eddy, MD 09/05/16 [id number]

## 2016-09-05 NOTE — Care Management Note (Signed)
Case Management Note  Patient Details  Name: Juan Harper MRN: [id number] Date of Birth: 07/15/1960  Subjective/Objective:  Asked by RN for resource for medications. Patient has Medicaid and shoild be able to set up home delivery through [facility].  It appears the patient sees all his providers at [facility]. This should make it easy for them to contact regarding refills. Home delivery is set up between 12 and 3 pm Monday through Friday.                Action/Plan:   Expected Discharge Date:                  Expected Discharge Plan:     In-House Referral:     Discharge planning Services     Post Acute Care Choice:    Choice offered to:     DME Arranged:    DME Agency:     HH Arranged:    HH Agency:     Status of Service:     If discussed at [employer] of Stay Meetings, dates discussed:    Additional Comments:  Berna Bue, RN 09/05/2016, 3:45 PM

## 2016-09-07 DIAGNOSIS — M5000 Cervical disc disorder with myelopathy, unspecified cervical region: Secondary | ICD-10-CM | POA: Insufficient documentation

## 2016-09-19 ENCOUNTER — Inpatient Hospital Stay
Admission: EM | Admit: 2016-09-19 | Discharge: 2016-09-21 | DRG: 684 | Disposition: A | Discharge status: Home Health | Payer: Medicaid Other | Attending: Specialist | Admitting: Specialist

## 2016-09-19 DIAGNOSIS — I129 Hypertensive chronic kidney disease with stage 1 through stage 4 chronic kidney disease, or unspecified chronic kidney disease: Secondary | ICD-10-CM | POA: Diagnosis present

## 2016-09-19 DIAGNOSIS — M542 Cervicalgia: Secondary | ICD-10-CM | POA: Diagnosis present

## 2016-09-19 DIAGNOSIS — N179 Acute kidney failure, unspecified: Secondary | ICD-10-CM | POA: Diagnosis present

## 2016-09-19 DIAGNOSIS — E860 Dehydration: Secondary | ICD-10-CM | POA: Diagnosis present

## 2016-09-19 DIAGNOSIS — R55 Syncope and collapse: Secondary | ICD-10-CM | POA: Diagnosis present

## 2016-09-19 DIAGNOSIS — G4733 Obstructive sleep apnea (adult) (pediatric): Secondary | ICD-10-CM | POA: Diagnosis present

## 2016-09-19 DIAGNOSIS — Z9989 Dependence on other enabling machines and devices: Secondary | ICD-10-CM

## 2016-09-19 DIAGNOSIS — F17210 Nicotine dependence, cigarettes, uncomplicated: Secondary | ICD-10-CM | POA: Diagnosis present

## 2016-09-19 DIAGNOSIS — E785 Hyperlipidemia, unspecified: Secondary | ICD-10-CM | POA: Diagnosis present

## 2016-09-19 DIAGNOSIS — Z79899 Other long term (current) drug therapy: Secondary | ICD-10-CM | POA: Diagnosis not present

## 2016-09-19 DIAGNOSIS — M4692 Unspecified inflammatory spondylopathy, cervical region: Secondary | ICD-10-CM | POA: Diagnosis present

## 2016-09-19 DIAGNOSIS — N400 Enlarged prostate without lower urinary tract symptoms: Secondary | ICD-10-CM | POA: Diagnosis present

## 2016-09-19 DIAGNOSIS — N189 Chronic kidney disease, unspecified: Secondary | ICD-10-CM | POA: Diagnosis present

## 2016-09-19 DIAGNOSIS — Z7982 Long term (current) use of aspirin: Secondary | ICD-10-CM

## 2016-09-19 DIAGNOSIS — I959 Hypotension, unspecified: Secondary | ICD-10-CM

## 2016-09-19 DIAGNOSIS — Z981 Arthrodesis status: Secondary | ICD-10-CM | POA: Diagnosis not present

## 2016-09-19 DIAGNOSIS — Z7984 Long term (current) use of oral hypoglycemic drugs: Secondary | ICD-10-CM

## 2016-09-19 DIAGNOSIS — M1711 Unilateral primary osteoarthritis, right knee: Secondary | ICD-10-CM | POA: Diagnosis present

## 2016-09-19 DIAGNOSIS — E1122 Type 2 diabetes mellitus with diabetic chronic kidney disease: Secondary | ICD-10-CM | POA: Diagnosis present

## 2016-09-19 DIAGNOSIS — R262 Difficulty in walking, not elsewhere classified: Secondary | ICD-10-CM | POA: Diagnosis present

## 2016-09-19 DIAGNOSIS — E119 Type 2 diabetes mellitus without complications: Secondary | ICD-10-CM

## 2016-09-19 DIAGNOSIS — R531 Weakness: Secondary | ICD-10-CM

## 2016-09-19 LAB — URINE DRUG SCREEN, QUALITATIVE (ARMC ONLY)
Amphetamines, Ur Screen: NOT DETECTED
BENZODIAZEPINE, UR SCRN: NOT DETECTED
Barbiturates, Ur Screen: NOT DETECTED
Cannabinoid 50 Ng, Ur ~~LOC~~: NOT DETECTED
Cocaine Metabolite,Ur ~~LOC~~: POSITIVE — AB
MDMA (ECSTASY) UR SCREEN: NOT DETECTED
Methadone Scn, Ur: NOT DETECTED
Opiate, Ur Screen: NOT DETECTED
PHENCYCLIDINE (PCP) UR S: NOT DETECTED
Tricyclic, Ur Screen: NOT DETECTED

## 2016-09-19 LAB — BASIC METABOLIC PANEL
Anion gap: 10 (ref 5–15)
BUN: 34 mg/dL — ABNORMAL HIGH (ref 6–20)
CHLORIDE: 96 mmol/L — AB (ref 101–111)
CO2: 27 mmol/L (ref 22–32)
CREATININE: 2.72 mg/dL — AB (ref 0.61–1.24)
Calcium: 9.6 mg/dL (ref 8.9–10.3)
GFR calc non Af Amer: 24 mL/min — ABNORMAL LOW (ref >60)
GFR, EST AFRICAN AMERICAN: 28 mL/min — AB (ref >60)
Glucose, Bld: 174 mg/dL — ABNORMAL HIGH (ref 65–99)
POTASSIUM: 4.3 mmol/L (ref 3.5–5.1)
SODIUM: 133 mmol/L — AB (ref 135–145)

## 2016-09-19 LAB — URINALYSIS, COMPLETE (UACMP) WITH MICROSCOPIC
BILIRUBIN URINE: NEGATIVE
Bacteria, UA: NONE SEEN
HGB URINE DIPSTICK: NEGATIVE
Ketones, ur: NEGATIVE mg/dL
LEUKOCYTES UA: NEGATIVE
NITRITE: NEGATIVE
PH: 5 (ref 5.0–8.0)
Protein, ur: NEGATIVE mg/dL
SPECIFIC GRAVITY, URINE: 1.024 (ref 1.005–1.030)
SQUAMOUS EPITHELIAL / LPF: NONE SEEN

## 2016-09-19 LAB — CBC
HEMATOCRIT: 39.2 % — AB (ref 40.0–52.0)
Hemoglobin: 13 g/dL (ref 13.0–18.0)
MCH: 28.8 pg (ref 26.0–34.0)
MCHC: 33 g/dL (ref 32.0–36.0)
MCV: 87.2 fL (ref 80.0–100.0)
PLATELETS: 271 10*3/uL (ref 150–440)
RBC: 4.5 MIL/uL (ref 4.40–5.90)
RDW: 13.8 % (ref 11.5–14.5)
WBC: 8 10*3/uL (ref 3.8–10.6)

## 2016-09-19 LAB — TROPONIN I

## 2016-09-19 MED ORDER — GABAPENTIN 300 MG PO CAPS
600.0000 mg | ORAL_CAPSULE | Freq: Three times a day (TID) | ORAL | Status: DC
  Administered 2016-09-20 – 2016-09-21 (×5): 600 mg via ORAL
  Filled 2016-09-19 (×5): qty 2

## 2016-09-19 MED ORDER — ENOXAPARIN SODIUM 30 MG/0.3ML ~~LOC~~ SOLN
30.0000 mg | Freq: Two times a day (BID) | SUBCUTANEOUS | Status: DC
  Administered 2016-09-20 – 2016-09-21 (×4): 30 mg via SUBCUTANEOUS
  Filled 2016-09-19 (×4): qty 0.3

## 2016-09-19 MED ORDER — LORATADINE 10 MG PO TABS
10.0000 mg | ORAL_TABLET | Freq: Every day | ORAL | Status: DC
  Administered 2016-09-20 – 2016-09-21 (×2): 10 mg via ORAL
  Filled 2016-09-19 (×2): qty 1

## 2016-09-19 MED ORDER — SODIUM CHLORIDE 0.9 % IV BOLUS (SEPSIS)
1000.0000 mL | Freq: Once | INTRAVENOUS | Status: AC
  Administered 2016-09-19: 1000 mL via INTRAVENOUS

## 2016-09-19 MED ORDER — TAMSULOSIN HCL 0.4 MG PO CAPS
0.4000 mg | ORAL_CAPSULE | Freq: Every day | ORAL | Status: DC
  Administered 2016-09-20 – 2016-09-21 (×2): 0.4 mg via ORAL
  Filled 2016-09-19 (×2): qty 1

## 2016-09-19 MED ORDER — NALOXONE HCL 2 MG/2ML IJ SOSY
0.4000 mg | PREFILLED_SYRINGE | Freq: Once | INTRAMUSCULAR | Status: AC
  Administered 2016-09-19: 0.4 mg via INTRAVENOUS

## 2016-09-19 MED ORDER — PRAVASTATIN SODIUM 20 MG PO TABS
20.0000 mg | ORAL_TABLET | Freq: Every day | ORAL | Status: DC
  Administered 2016-09-20: 20 mg via ORAL
  Filled 2016-09-19: qty 1

## 2016-09-19 MED ORDER — SODIUM CHLORIDE 0.9% FLUSH
3.0000 mL | Freq: Two times a day (BID) | INTRAVENOUS | Status: DC
  Administered 2016-09-20 – 2016-09-21 (×3): 3 mL via INTRAVENOUS

## 2016-09-19 MED ORDER — OXYCODONE HCL 5 MG PO TABS
5.0000 mg | ORAL_TABLET | Freq: Four times a day (QID) | ORAL | Status: DC | PRN
  Administered 2016-09-20 – 2016-09-21 (×4): 5 mg via ORAL
  Filled 2016-09-19 (×4): qty 1

## 2016-09-19 MED ORDER — ASPIRIN EC 81 MG PO TBEC
81.0000 mg | DELAYED_RELEASE_TABLET | Freq: Every day | ORAL | Status: DC
  Administered 2016-09-20 – 2016-09-21 (×2): 81 mg via ORAL
  Filled 2016-09-19 (×2): qty 1

## 2016-09-19 MED ORDER — ALBUTEROL SULFATE (2.5 MG/3ML) 0.083% IN NEBU: 2.5000 mg | INHALATION_SOLUTION | Freq: Four times a day (QID) | RESPIRATORY_TRACT | Status: DC | PRN

## 2016-09-19 MED ORDER — IPRATROPIUM BROMIDE HFA 17 MCG/ACT IN AERS: 2.0000 | INHALATION_SPRAY | Freq: Four times a day (QID) | RESPIRATORY_TRACT | Status: DC | PRN

## 2016-09-19 MED ORDER — GLIPIZIDE 5 MG PO TABS
5.0000 mg | ORAL_TABLET | Freq: Two times a day (BID) | ORAL | Status: DC
  Administered 2016-09-20 – 2016-09-21 (×3): 5 mg via ORAL
  Filled 2016-09-19 (×4): qty 1

## 2016-09-19 MED ORDER — SODIUM CHLORIDE 0.9 % IV SOLN
INTRAVENOUS | Status: AC
  Administered 2016-09-20 (×2): via INTRAVENOUS

## 2016-09-19 MED ORDER — NALOXONE HCL 2 MG/2ML IJ SOSY
PREFILLED_SYRINGE | INTRAMUSCULAR | Status: AC
  Administered 2016-09-19: 0.4 mg via INTRAVENOUS
  Filled 2016-09-19: qty 2

## 2016-09-19 MED ORDER — ONDANSETRON HCL 4 MG PO TABS: 4.0000 mg | ORAL_TABLET | Freq: Four times a day (QID) | ORAL | Status: DC | PRN

## 2016-09-19 MED ORDER — ACETAMINOPHEN 325 MG PO TABS
650.0000 mg | ORAL_TABLET | Freq: Three times a day (TID) | ORAL | Status: DC
  Administered 2016-09-20 – 2016-09-21 (×5): 650 mg via ORAL
  Filled 2016-09-19 (×5): qty 2

## 2016-09-19 MED ORDER — TIOTROPIUM BROMIDE MONOHYDRATE 18 MCG IN CAPS
18.0000 ug | ORAL_CAPSULE | Freq: Every day | RESPIRATORY_TRACT | Status: DC
  Administered 2016-09-20 – 2016-09-21 (×2): 18 ug via RESPIRATORY_TRACT
  Filled 2016-09-19: qty 5

## 2016-09-19 MED ORDER — ONDANSETRON HCL 4 MG/2ML IJ SOLN: 4.0000 mg | Freq: Four times a day (QID) | INTRAMUSCULAR | Status: DC | PRN

## 2016-09-19 NOTE — ED Provider Notes (Signed)
[facility] Emergency Department Provider Note  Time seen: 4:32 PM  I have reviewed the triage vital signs and the nursing notes.   HISTORY  Chief Complaint Loss of Consciousness    HPI Juan Harper is a 56 y.o. male with a past medical history of a recent neck operation 2 weeks ago, diabetes, hypertension, presents to the emergency department with near syncope. According to the patient he has not had any of his home medications for the past 2 weeks but received them in the mail yesterday. He has been taking them since last night. Today he states he has felt extremely weak with difficulty ambulating. States he had 3 near syncopal episodes today where he felt lightheadeddenies any chest pain, shortness of breath, nausea, vomiting, diarrhea, abdominal pain, fever.  Past Medical History:  Diagnosis Date  . Arthritis    NECK/ RIGHT KNEE  . Bell's palsy   . BPH (benign prostatic hyperplasia)   . Cough    chronic  . Diabetes mellitus without complication (HCC)   . Edema    legs/feet  . 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 Hypertension   . Orthopnea   . Shortness of breath dyspnea   . Sleep apnea    cpap  . Wheezing     There are no active problems to display for this patient.   Past Surgical History:  Procedure Laterality Date  . BACK SURGERY    . CATARACT EXTRACTION W/PHACO Right 07/31/2016   Procedure: CATARACT EXTRACTION PHACO AND INTRAOCULAR LENS PLACEMENT (IOC);  Surgeon: Nevada Crane, MD;  Location: [facility] ORS;  Service: Ophthalmology;  Laterality: Right;  Lot # [id number] H US:00:26.9 AP%:6.8% CDE: 1.83  . COLONOSCOPY    . NECK MASS EXCISION    . PILONIDAL CYST EXCISION      Prior to Admission medications   Medication Sig Start Date End Date Taking? Authorizing Provider  acetaminophen (TYLENOL) 325 MG tablet Take 650 mg by mouth 3 (three) times daily.    Historical Provider, MD  albuterol (PROVENTIL HFA;VENTOLIN HFA)  108 (90 Base) MCG/ACT inhaler Inhale 2 puffs into the lungs every 6 (six) hours as needed for wheezing or shortness of breath.    Historical Provider, MD  aspirin EC 81 MG tablet Take 81 mg by mouth daily.    Historical Provider, MD  gabapentin (NEURONTIN) 300 MG capsule Take 600 mg by mouth 2 (two) times daily.     Historical Provider, MD  glipiZIDE (GLUCOTROL) 5 MG tablet Take 1 tablet (5 mg total) by mouth 2 (two) times daily before a meal. 09/05/16   Willy Eddy, MD  ipratropium (ATROVENT HFA) 17 MCG/ACT inhaler Inhale into the lungs 2 (two) times daily.    Historical Provider, MD  lisinopril-hydrochlorothiazide (PRINZIDE,ZESTORETIC) 20-25 MG tablet Take 1 tablet by mouth daily.    Historical Provider, MD  loratadine (CLARITIN) 10 MG tablet Take 10 mg by mouth daily.    Historical Provider, MD  lovastatin (MEVACOR) 20 MG tablet Take 20 mg by mouth at bedtime.    Historical Provider, MD  metFORMIN (GLUCOPHAGE) 500 MG tablet Take 2 tablets (1,000 mg total) by mouth 2 (two) times daily. 09/05/16   Willy Eddy, MD  oxycodone (OXY-IR) 5 MG capsule Take 5 mg by mouth every 6 (six) hours as needed.    Historical Provider, MD  sitaGLIPtin (JANUVIA) 100 MG tablet Take 100 mg by mouth daily.    Historical Provider, MD  tamsulosin (FLOMAX) 0.4 MG CAPS capsule Take 0.4 mg by mouth.    Historical Provider, MD    No Known Allergies  No family history on file.  Social History Social History  Substance Use Topics  . Smoking status: Current Every Day Smoker    Packs/day: 0.50    Types: Cigarettes  . Smokeless tobacco: Never Used  . Alcohol use No    Review of Systems Constitutional: Negative for fever. Eyes: Negative for visual changes. ENT: Negative for congestion Cardiovascular: Negative for chest pain. Respiratory: Negative for shortness of breath. Gastrointestinal: Negative for abdominal pain Genitourinary: Negative for dysuria. Musculoskeletal: Negative for back pain. Skin:  Negative for rash. Neurological: Negative for headache All other ROS negative  ____________________________________________   PHYSICAL EXAM:  VITAL SIGNS: ED Triage Vitals  Enc Vitals Group     BP 09/19/16 1623 (!) 90/58     Pulse Rate 09/19/16 1623 (!) 101     Resp 09/19/16 1623 18     Temp 09/19/16 1623 98.4 F (36.9 C)     Temp Source 09/19/16 1623 Oral     SpO2 09/19/16 1623 99 %     Weight 09/19/16 1624 242 lb (109.8 kg)     Height 09/19/16 1624  (1.803 m)     Head Circumference --      Peak Flow --      Pain Score 09/19/16 1623 0     Pain Loc --      Pain Edu? --      Excl. in GC? --     Constitutional: Alert and oriented. Patient appears fatigue/week. Eyes: Normal exam, 2 mm PERRL. ENT   Head: Normocephalic and atraumatic.   Nose: No congestion/rhinnorhea.   Mouth/Throat: Mucous membranes are moist. Cardiovascular: Normal rate, regular rhythm. No murmur Respiratory: Normal respiratory effort without tachypnea nor retractions. Breath sounds are clear Gastrointestinal: Soft and nontender. No distention. Musculoskeletal: Nontender with normal range of motion in all extremities.  Neurologic:  Normal speech and language. No gross focal neurologic deficits  Skin:  Skin is warm, dry and intact.  Psychiatric: Mood and affect are normal.  ____________________________________________    EKG  EKG reviewed and interpreted by myself shows what appears to be a normal sinus rhythm at 102 bpm, narrow QRS, normal axis, normal intervals, nonspecific ST changes.  ____________________________________________   INITIAL IMPRESSION / ASSESSMENT AND PLAN / ED COURSE  Pertinent labs & imaging results that were available during my care of the patient were reviewed by me and considered in my medical decision making (see chart for details).  Patient presents to the emergency department for generalized weakness, 3 near-syncopal episodes today. Upon arrival the  patient is noted to be hypotensive in the 90s. Patient receiving IV fluids but remains hypotensive in the 90s. On the patient's lab work is noted to have acute on chronic renal insufficiency. I highly suspect the patient's hypotension and weakness could be a result of him taking off his medications last night and today that he has not taken for the past 2 weeks including pain medication and blood pressure medication. Patient given half a milligram of Narcan in the emergency department with no response in his blood pressure. We ambulated the patient in the room, patient is very unsteady but does not appear to have any focal deficits, just generalized weakness. Given the patient's continued weakness with hypotension we will admit.  ____________________________________________   FINAL CLINICAL IMPRESSION(S) / ED DIAGNOSES  Acute on real insufficiency. Hypotension  Weakness   Minna Antis, MD 09/19/16 2007

## 2016-09-19 NOTE — H&P (Signed)
[facility] - [place] at [facility]   PATIENT NAME: Juan Harper    MR#:  [id number]  DATE OF BIRTH:  1961/05/11  DATE OF ADMISSION:  09/19/2016  PRIMARY CARE PHYSICIAN: Pcp Not In System   REQUESTING/REFERRING PHYSICIAN: Minna Antis, MD  CHIEF COMPLAINT:   Dizziness HISTORY OF PRESENT ILLNESS:  Juan Harper  is a 56 y.o. male with a known history of Diabetes mellitus, hypertension, chronic kidney disease and other medical problems just had cervical fusion on April 16 is presenting to the ED with a chief complaint of dizziness. Patient almost passed out but denies any chest pain or shortness of breath. Patient was feeling extremely weak and having difficulty with ambulation. Patient ran out of all his medications to 3 weeks ago and resumed all of them 2 days ago after receiving them via mail. Patient is wearing cervical collar. Denies any complaints during my examination while resting.  PAST MEDICAL HISTORY:   Past Medical History:  Diagnosis Date  . Arthritis    NECK/ RIGHT KNEE  . Bell's palsy   . BPH (benign prostatic hyperplasia)   . Cough    chronic  . Diabetes mellitus without complication (HCC)   . Edema    legs/feet  . 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 Hypertension   . Orthopnea   . Shortness of breath dyspnea   . Sleep apnea    cpap  . Wheezing     PAST SURGICAL HISTOIRY:   Past Surgical History:  Procedure Laterality Date  . BACK SURGERY    . CATARACT EXTRACTION W/PHACO Right 07/31/2016   Procedure: CATARACT EXTRACTION PHACO AND INTRAOCULAR LENS PLACEMENT (IOC);  Surgeon: Nevada Crane, MD;  Location: [facility] ORS;  Service: Ophthalmology;  Laterality: Right;  Lot # [id number] H US:00:26.9 AP%:6.8% CDE: 1.83  . COLONOSCOPY    . NECK MASS EXCISION    . PILONIDAL CYST EXCISION      SOCIAL HISTORY:   Social History  Substance Use Topics  . Smoking status: Current Every Day Smoker    Packs/day: 0.50     Types: Cigarettes  . Smokeless tobacco: Never Used  . Alcohol use No    FAMILY HISTORY:  No family history on file.  DRUG ALLERGIES:  No Known Allergies  REVIEW OF SYSTEMS:  CONSTITUTIONAL: No fever, fatigue . Reporting generalized weakness.  EYES: No blurred or double vision.  EARS, NOSE, AND THROAT: No tinnitus or ear pain.  RESPIRATORY: No cough, shortness of breath, wheezing or hemoptysis.  CARDIOVASCULAR: No chest pain, orthopnea, edema.  GASTROINTESTINAL: No nausea, vomiting, diarrhea or abdominal pain.  GENITOURINARY: No dysuria, hematuria.  ENDOCRINE: No polyuria, nocturia,  HEMATOLOGY: No anemia, easy bruising or bleeding SKIN: No rash or lesion. MUSCULOSKELETAL: No joint pain or arthritis.   NEUROLOGIC: No tingling, numbness, weakness. Reporting dizziness  PSYCHIATRY: No anxiety or depression.   MEDICATIONS AT HOME:   Prior to Admission medications   Medication Sig Start Date End Date Taking? Authorizing Provider  acetaminophen (TYLENOL) 325 MG tablet Take 650 mg by mouth 3 (three) times daily.   Yes Historical Provider, MD  albuterol (PROVENTIL HFA;VENTOLIN HFA) 108 (90 Base) MCG/ACT inhaler Inhale 2 puffs into the lungs every 6 (six) hours as needed for wheezing or shortness of breath.   Yes Historical Provider, MD  aspirin EC 81 MG tablet Take 81 mg by mouth daily.   Yes Historical Provider, MD  gabapentin (NEURONTIN) 300 MG capsule  Take 600 mg by mouth 3 (three) times daily.    Yes Historical Provider, MD  glipiZIDE (GLUCOTROL) 5 MG tablet Take 1 tablet (5 mg total) by mouth 2 (two) times daily before a meal. 09/05/16  Yes Willy Eddy, MD  ipratropium (ATROVENT HFA) 17 MCG/ACT inhaler Inhale into the lungs 2 (two) times daily.   Yes Historical Provider, MD  lisinopril-hydrochlorothiazide (PRINZIDE,ZESTORETIC) 20-25 MG tablet Take 1 tablet by mouth daily.   Yes Historical Provider, MD  loratadine (CLARITIN) 10 MG tablet Take 10 mg by mouth daily.   Yes Historical  Provider, MD  lovastatin (MEVACOR) 20 MG tablet Take 20 mg by mouth at bedtime.   Yes Historical Provider, MD  metFORMIN (GLUCOPHAGE) 500 MG tablet Take 2 tablets (1,000 mg total) by mouth 2 (two) times daily. 09/05/16  Yes Willy Eddy, MD  oxycodone (OXY-IR) 5 MG capsule Take 5 mg by mouth every 6 (six) hours as needed.   Yes Historical Provider, MD  sitaGLIPtin (JANUVIA) 100 MG tablet Take 100 mg by mouth daily.   Yes Historical Provider, MD  tamsulosin (FLOMAX) 0.4 MG CAPS capsule Take 0.4 mg by mouth.   Yes Historical Provider, MD  tiotropium (SPIRIVA) 18 MCG inhalation capsule Place 18 mcg into inhaler and inhale daily. 07/16/16 07/16/17 Yes Historical Provider, MD      VITAL SIGNS:  Blood pressure 95/62, pulse (!) 102, temperature 98.4 F (36.9 C), temperature source Oral, resp. rate (!) 21, height  (1.803 m), weight 109.8 kg (242 lb), SpO2 95 %.  PHYSICAL EXAMINATION:  GENERAL:  56 y.o.-year-old patient lying in the bed with no acute distress.  EYES: Pupils equal, round, reactive to light and accommodation. No scleral icterus. Extraocular muscles intact.  HEENT: Head atraumatic, normocephalic. Oropharynx and nasopharynx clear.  NECK: Status post cervical surgery having cervical collar Supple, no jugular venous distention.  LUNGS: Normal breath sounds bilaterally, no wheezing, rales,rhonchi or crepitation. No use of accessory muscles of respiration.  CARDIOVASCULAR: S1, S2 normal. No murmurs, rubs, or gallops.  ABDOMEN: Soft, nontender, nondistended. Bowel sounds present. No organomegaly or mass.  EXTREMITIES: No pedal edema, cyanosis, or clubbing.  NEUROLOGIC: Cranial nerves II through XII are intact. Muscle strength 5/5 in all extremities. Sensation intact. Gait not checked.  PSYCHIATRIC: The patient is alert and oriented x 3.  SKIN: No obvious rash, lesion, or ulcer.   LABORATORY PANEL:   CBC  Recent Labs Lab 09/19/16 1623  WBC 8.0  HGB 13.0  HCT 39.2*  PLT 271    ------------------------------------------------------------------------------------------------------------------  Chemistries   Recent Labs Lab 09/19/16 1623  NA 133*  K 4.3  CL 96*  CO2 27  GLUCOSE 174*  BUN 34*  CREATININE 2.72*  CALCIUM 9.6   ------------------------------------------------------------------------------------------------------------------  Cardiac Enzymes  Recent Labs Lab 09/19/16 1623  TROPONINI <0.03   ------------------------------------------------------------------------------------------------------------------  RADIOLOGY:  No results found.  EKG:   Orders placed or performed during the hospital encounter of 09/19/16  . ED EKG  . ED EKG  . EKG 12-Lead  . EKG 12-Lead    IMPRESSION AND PLAN:  Juan Harper  is a 56 y.o. male with a known history of Diabetes mellitus, hypertension, chronic kidney disease and other medical problems just had cervical fusion on April 16 is presenting to the ED with a chief complaint of dizziness. Patient almost passed out but denies any chest pain or shortness of breath    #Near-syncope secondary to dehydration from poor by mouth intake/from recent neck surgery Admit to MedSurg  unit monitor patient on telemetry. Check orthostatics provide IV fluids Will order carotid Dopplers and 2-D echocardiogram. PT evaluation  #Acute kidney injury on chronic kidney disease Provide IV fluids and monitor renal function closely. Avoid nephrotoxins. If no improvement will order renal ultrasound and nephrology consult  #Generalized weakness could be from dehydration IV fluids and PT consult  #Chronic history of diabetes mellitus Sliding scale insulin. Holding metformin and Januvia in view of renal insufficiency. Continue glipizide. Check hemoglobin A1c  #Neck pain from recent neck surgery on April 16. Continue neck collar. Patient just had fusion of cervical spine  #Benign prostatic hypertrophy continue  Flomax    All the records are reviewed and case discussed with ED provider. Management plans discussed with the patient, family and they are in agreement.  CODE STATUS: fc, friend Steward Drone is the healthcare power of attorney  TOTAL TIME TAKING CARE OF THIS PATIENT: .   Note: This dictation was prepared with Dragon dictation along with smaller phrase technology. Any transcriptional errors that result from this process are unintentional.  Ramonita Lab M.D on 09/19/2016 at 9:46 PM  Between 7am to 6pm - Pager - [phone]  After 6pm go to [email or web address] - password [email or web address]  [facility]  Office  [phone]  CC: Primary care physician; Pcp Not In System

## 2016-09-19 NOTE — ED Notes (Addendum)
Pt bib EMS from home w/ c/o syncopal episode x 3 . Pt A/OX4 on arrival, denies new pain or n/v/d. Pt sts that he "doesn't feel right". Pt sts that he was out medication x 2 weeks, received medications yesterday and sts that he took all his medications as prescribed today.  CBG 202 per EMS.  Pt answers all questions, and will open eyes on command.  Denies injury to head, sts he "gets' to floor

## 2016-09-20 LAB — CBC
HEMATOCRIT: 33.2 % — AB (ref 40.0–52.0)
Hemoglobin: 11.3 g/dL — ABNORMAL LOW (ref 13.0–18.0)
MCH: 29.9 pg (ref 26.0–34.0)
MCHC: 34 g/dL (ref 32.0–36.0)
MCV: 87.9 fL (ref 80.0–100.0)
PLATELETS: 249 10*3/uL (ref 150–440)
RBC: 3.78 MIL/uL — AB (ref 4.40–5.90)
RDW: 13.8 % (ref 11.5–14.5)
WBC: 6.2 10*3/uL (ref 3.8–10.6)

## 2016-09-20 LAB — COMPREHENSIVE METABOLIC PANEL
ALT: 16 U/L — ABNORMAL LOW (ref 17–63)
AST: 19 U/L (ref 15–41)
Albumin: 2.7 g/dL — ABNORMAL LOW (ref 3.5–5.0)
Alkaline Phosphatase: 68 U/L (ref 38–126)
Anion gap: 7 (ref 5–15)
BILIRUBIN TOTAL: 0.3 mg/dL (ref 0.3–1.2)
BUN: 35 mg/dL — ABNORMAL HIGH (ref 6–20)
CHLORIDE: 98 mmol/L — AB (ref 101–111)
CO2: 25 mmol/L (ref 22–32)
CREATININE: 2.13 mg/dL — AB (ref 0.61–1.24)
Calcium: 8.4 mg/dL — ABNORMAL LOW (ref 8.9–10.3)
GFR calc Af Amer: 38 mL/min — ABNORMAL LOW (ref >60)
GFR, EST NON AFRICAN AMERICAN: 33 mL/min — AB (ref >60)
Glucose, Bld: 352 mg/dL — ABNORMAL HIGH (ref 65–99)
POTASSIUM: 4.6 mmol/L (ref 3.5–5.1)
Sodium: 130 mmol/L — ABNORMAL LOW (ref 135–145)
TOTAL PROTEIN: 6.6 g/dL (ref 6.5–8.1)

## 2016-09-20 MED ORDER — IPRATROPIUM BROMIDE 0.02 % IN SOLN: 0.5000 mg | Freq: Four times a day (QID) | RESPIRATORY_TRACT | Status: DC | PRN

## 2016-09-20 NOTE — Plan of Care (Signed)
Patient 's pain reports to be 10/10. Patient requesting pain meds, but informed they are not due till 1545.  Dr. Informed that patient wants a higher dosage or more often, since was receiving  q4 at prior facility.  Due to patient receiving Narcan yesterday, Dr. does not feel comfortable changing pain meds. Patient informed.

## 2016-09-20 NOTE — Progress Notes (Signed)
[facility] - [place] at [facility]   PATIENT NAME: Juan Harper    MR#:  [id number]  DATE OF BIRTH:  01-Feb-1961  SUBJECTIVE:   Patient here with a presyncopal episode and noted to be in acute on chronic renal failure. Patient is very sleepy this morning when seen. Denies any complaints presently.  REVIEW OF SYSTEMS:    Review of Systems  Constitutional: Negative for chills and fever.  HENT: Negative for congestion and tinnitus.   Eyes: Negative for blurred vision and double vision.  Respiratory: Negative for cough, shortness of breath and wheezing.   Cardiovascular: Negative for chest pain, orthopnea and PND.  Gastrointestinal: Negative for abdominal pain, diarrhea, nausea and vomiting.  Genitourinary: Negative for dysuria and hematuria.  Musculoskeletal: Positive for neck pain.  Neurological: Negative for dizziness, sensory change and focal weakness.  All other systems reviewed and are negative.   Nutrition: Carb modified Tolerating Diet: Yes Tolerating PT: Await PT eval.   DRUG ALLERGIES:  No Known Allergies  VITALS:  Blood pressure 119/68, pulse 87, temperature 97.8 F (36.6 C), temperature source Oral, resp. rate 20, height  (1.803 m), weight 109.8 kg (242 lb), SpO2 100 %.  PHYSICAL EXAMINATION:   Physical Exam  GENERAL:  56 y.o.-year-old patient lying in bed in no acute distress.  EYES: Pupils equal, round, reactive to light and accommodation. No scleral icterus. Extraocular muscles intact.  HEENT: Head atraumatic, normocephalic. Oropharynx and nasopharynx clear. + cervical collar in place.  NECK:  Supple, no jugular venous distention. No thyroid enlargement, no tenderness.  LUNGS: Normal breath sounds bilaterally, no wheezing, rales, rhonchi. No use of accessory muscles of respiration.  CARDIOVASCULAR: S1, S2 normal. No murmurs, rubs, or gallops.  ABDOMEN: Soft, nontender, nondistended. Bowel sounds present. No organomegaly or mass.   EXTREMITIES: No cyanosis, clubbing or edema b/l.    NEUROLOGIC: Cranial nerves II through XII are intact. No focal Motor or sensory deficits b/l.   PSYCHIATRIC: The patient is alert and oriented x 3.  SKIN: No obvious rash, lesion, or ulcer.    LABORATORY PANEL:   CBC  Recent Labs Lab 09/20/16 0504  WBC 6.2  HGB 11.3*  HCT 33.2*  PLT 249   ------------------------------------------------------------------------------------------------------------------  Chemistries   Recent Labs Lab 09/20/16 0504  NA 130*  K 4.6  CL 98*  CO2 25  GLUCOSE 352*  BUN 35*  CREATININE 2.13*  CALCIUM 8.4*  AST 19  ALT 16*  ALKPHOS 68  BILITOT 0.3   ------------------------------------------------------------------------------------------------------------------  Cardiac Enzymes  Recent Labs Lab 09/19/16 1623  TROPONINI <0.03   ------------------------------------------------------------------------------------------------------------------  RADIOLOGY:  No results found.   ASSESSMENT AND PLAN:   56 year old male with past medical history of obstructive sleep apnea, hypertension, recent cervical spine fusion, diabetes, BPH who presents to the hospital with a presyncopal episode.  1. Presyncope-secondary to dehydration and poor by mouth intake. -Continue gentle IV fluids, await orthostatic vital signs.  2. Acute on chronic renal failure - due to dehydration and poor PO intake.  - improving with IV fluid hydration and will follow BUN/Cr.  - Renal dose meds avoid nephrotoxins.   3. s/p Cervical Spinal fusion - cont. Neck Collar.  - cont. Pain control with Oxycodone.   4. Hyperlipidemia - cont. Pravachol.   5. DM - cont. Glipizide.    6. BPH - cont. Flomax.    All the records are reviewed and case discussed with Care Management/Social Worker. Management plans discussed with the patient, family and they  are in agreement.  CODE STATUS: Full code  DVT Prophylaxis:  Lovenox  TOTAL TIME TAKING CARE OF THIS PATIENT: 30 minutes.   POSSIBLE D/C IN 1-2 DAYS, DEPENDING ON CLINICAL CONDITION.   Houston Siren M.D on 09/20/2016 at 2:14 PM  Between 7am to 6pm - Pager - [phone]  After 6pm go to [email or web address] - Social research officer, government  [facility] [place] [facility]  Office  [phone]  CC: Primary care physician; Pcp Not In System

## 2016-09-20 NOTE — Progress Notes (Signed)
Patient is on VTE prophylaxis w/ Lovenox. Patient's TBW > 125% of IBW -- Will increase dose by 25%   Will initiate lovenox 30 mg subcutaneous twice daily for VTE prophylaxis.  Thomasene Ripple, PharmD, BCPS Clinical Pharmacist 09/20/2016

## 2016-09-21 LAB — BASIC METABOLIC PANEL
Anion gap: 6 (ref 5–15)
BUN: 22 mg/dL — AB (ref 6–20)
CALCIUM: 8.6 mg/dL — AB (ref 8.9–10.3)
CO2: 25 mmol/L (ref 22–32)
Chloride: 100 mmol/L — ABNORMAL LOW (ref 101–111)
Creatinine, Ser: 1.13 mg/dL (ref 0.61–1.24)
GFR calc Af Amer: >60 mL/min (ref >60)
Glucose, Bld: 307 mg/dL — ABNORMAL HIGH (ref 65–99)
POTASSIUM: 4.7 mmol/L (ref 3.5–5.1)
SODIUM: 131 mmol/L — AB (ref 135–145)

## 2016-09-21 LAB — GLUCOSE, CAPILLARY: Glucose-Capillary: 303 mg/dL — ABNORMAL HIGH (ref 65–99)

## 2016-09-21 LAB — HIV ANTIBODY (ROUTINE TESTING W REFLEX): HIV SCREEN 4TH GENERATION: NONREACTIVE

## 2016-09-21 MED ORDER — PNEUMOCOCCAL VAC POLYVALENT 25 MCG/0.5ML IJ INJ
0.5000 mL | INJECTION | INTRAMUSCULAR | Status: DC
Start: 2016-09-22 — End: 2016-09-21
  Filled 2016-09-21: qty 0.5

## 2016-09-21 NOTE — Discharge Instructions (Signed)
Follow all MD discharge instructions. Take all medications as prescribed. Keep all follow up appointments. If your symptoms return, call your doctor. If you experience any new symptoms that are of concern to you or that are bothersome to you, call your doctor. For all questions and/or concerns, call your doctor. ° ° If you have a medical emergency, call 911 ° ° ° °

## 2016-09-21 NOTE — Progress Notes (Signed)
[facility] - [place] at [facility]   PATIENT NAME: Juan Harper    MR#:  [id number]  DATE OF BIRTH:  1960/09/05  SUBJECTIVE:   Patient here with a presyncopal episode and noted to be in acute on chronic renal failure. Cr. Back to baseline today.  No other complaints.  Await PT eval.   REVIEW OF SYSTEMS:    Review of Systems  Constitutional: Negative for chills and fever.  HENT: Negative for congestion and tinnitus.   Eyes: Negative for blurred vision and double vision.  Respiratory: Negative for cough, shortness of breath and wheezing.   Cardiovascular: Negative for chest pain, orthopnea and PND.  Gastrointestinal: Negative for abdominal pain, diarrhea, nausea and vomiting.  Genitourinary: Negative for dysuria and hematuria.  Musculoskeletal: Positive for neck pain.  Neurological: Negative for dizziness, sensory change and focal weakness.  All other systems reviewed and are negative.   Nutrition: Carb modified Tolerating Diet: Yes Tolerating PT: Await PT eval.   DRUG ALLERGIES:  No Known Allergies  VITALS:  Blood pressure 122/78, pulse 85, temperature 98.6 F (37 C), temperature source Oral, resp. rate 18, height  (1.803 m), weight 109.8 kg (242 lb), SpO2 99 %.  PHYSICAL EXAMINATION:   Physical Exam  GENERAL:  56 y.o.-year-old patient lying in bed in no acute distress.  EYES: Pupils equal, round, reactive to light and accommodation. No scleral icterus. Extraocular muscles intact.  HEENT: Head atraumatic, normocephalic. Oropharynx and nasopharynx clear. + cervical collar in place.  NECK:  Supple, no jugular venous distention. No thyroid enlargement, no tenderness.  LUNGS: Normal breath sounds bilaterally, no wheezing, rales, rhonchi. No use of accessory muscles of respiration.  CARDIOVASCULAR: S1, S2 normal. No murmurs, rubs, or gallops.  ABDOMEN: Soft, nontender, nondistended. Bowel sounds present. No organomegaly or mass.  EXTREMITIES: No  cyanosis, clubbing or edema b/l.    NEUROLOGIC: Cranial nerves II through XII are intact. No focal Motor or sensory deficits b/l. Globally weak.    PSYCHIATRIC: The patient is alert and oriented x 3.  SKIN: No obvious rash, lesion, or ulcer.    LABORATORY PANEL:   CBC  Recent Labs Lab 09/20/16 0504  WBC 6.2  HGB 11.3*  HCT 33.2*  PLT 249   ------------------------------------------------------------------------------------------------------------------  Chemistries   Recent Labs Lab 09/20/16 0504 09/21/16 0347  NA 130* 131*  K 4.6 4.7  CL 98* 100*  CO2 25 25  GLUCOSE 352* 307*  BUN 35* 22*  CREATININE 2.13* 1.13  CALCIUM 8.4* 8.6*  AST 19  --   ALT 16*  --   ALKPHOS 68  --   BILITOT 0.3  --    ------------------------------------------------------------------------------------------------------------------  Cardiac Enzymes  Recent Labs Lab 09/19/16 1623  TROPONINI <0.03   ------------------------------------------------------------------------------------------------------------------  RADIOLOGY:  No results found.   ASSESSMENT AND PLAN:   56 year old male with past medical history of obstructive sleep apnea, hypertension, recent cervical spine fusion, diabetes, BPH who presents to the hospital with a presyncopal episode.  1. Presyncope-secondary to dehydration and poor by mouth intake. - improved w/ IV fluids.  No further episodes and await PT eval.  - Orthostatics are (-) now.   2. Acute on chronic renal failure - due to dehydration and poor PO intake.  - resolved w/ IV fluid hydration.  - Renal dose meds avoid nephrotoxins.   3. s/p Cervical Spinal fusion - cont. Neck Collar.  - cont. Pain control with Oxycodone.  Await Pt eval.   4. Hyperlipidemia - cont. Pravachol.  5. DM - cont. Glipizide.    6. BPH - cont. Flomax.   Possible d/c home later today pending PT eval.   All the records are reviewed and case discussed with Care  Management/Social Worker. Management plans discussed with the patient, family and they are in agreement.  CODE STATUS: Full code  DVT Prophylaxis: Lovenox  TOTAL TIME TAKING CARE OF THIS PATIENT: 25 minutes.   POSSIBLE D/C IN 1-2 DAYS, DEPENDING ON CLINICAL CONDITION.   Houston Siren M.D on 09/21/2016 at 12:49 PM  Between 7am to 6pm - Pager - [phone]  After 6pm go to [email or web address] - Social research officer, government  [facility] [place] [facility]  Office  [phone]  CC: Primary care physician; Pcp Not In System

## 2016-09-21 NOTE — Care Management Note (Signed)
Case Management Note  Patient Details  Name: [name] MRN: [id number] Date of Birth: [birth year]  Subjective/Objective:      Discharge planning was discussed with Mr Silus Lanzo who is in a neck brace post op cervical spine surgery. A referral for HH-PT was called to Shaune Leeks, [facility] Liaison for [facility]. Vaughan Basta will review the diagnoses to see whether Mr Rosenfield meets Medicaid diagnosis for HH-PT.               Action/Plan:   Expected Discharge Date:  09/21/16               Expected Discharge Plan:     In-House Referral:     Discharge planning Services     Post Acute Care Choice:    Choice offered to:     DME Arranged:    DME Agency:     HH Arranged:    HH Agency:     Status of Service:     If discussed at [employer], dates discussed:    Additional Comments:  Willmer Fellers A, RN 09/21/2016, 1:48 PM

## 2016-09-21 NOTE — Progress Notes (Signed)
Pt d/c home; d/c instructions reviewed w/ pt; pt understanding was verbalized; IV removed, catheter in tact, gauze dressing applied; all pt questions answered; pt verbalized that all pt belongings were accounted for; pt left unit via wheelchair accompanied by staff 

## 2016-09-21 NOTE — Evaluation (Signed)
Physical Therapy Evaluation Patient Details Name: Juan Harper MRN: [id number] DOB: 08-21-1960 Today's Date: 09/21/2016   History of Present Illness  Pt is a 56 y.o. male presenting to hospital with 3 near syncopies, dizziness, and hypotension.  Pt admitted to hospital with near syncope secondary to dehydration from poor mouth intake/from recent neck surgery; also acute on chronic kidney disease.  PMH includes DM, htn, Bell's Palsy, vertebral corpectomy (anterior with decompression) 09/08/16.  Clinical Impression  Prior to hospital admission, pt was ambulating with RW (pt transitioned to using RW d/t feeling safer with it d/t reporting multiple falls since recent neck surgery).  Pt lives alone in 1 level apt.  Currently pt is SBA with transfers and CGA with ambulation around nursing loop with RW.  Pt initially had difficulty walking upon first trial with RW (although still CGA) but upon 2nd trial pt with improvement and able to ambulate around nursing loop with RW.  Pt would benefit from skilled PT to address noted impairments and functional limitations.  Recommend pt discharge to home with HHPT when medically appropriate.    Follow Up Recommendations Home health PT    Equipment Recommendations  Rolling walker with 5" wheels (pt already owns RW)    Recommendations for Other Services       Precautions / Restrictions Precautions Precautions: Fall Required Braces or Orthoses: Cervical Brace Cervical Brace: Hard collar;At all times Restrictions Weight Bearing Restrictions: No      Mobility  Bed Mobility Overal bed mobility: Needs Assistance Bed Mobility: Supine to Sit;Sit to Supine     Supine to sit: Supervision;HOB elevated Sit to supine: Supervision;HOB elevated   General bed mobility comments: Use of bed rail.  Pt reports he usually crawls in and out of bed at home and that seems to work well for him since surgery; (not replicated in hospital).  Transfers Overall transfer level:  Needs assistance Equipment used: Rolling walker (2 wheeled) Transfers: Sit to/from Stand Sit to Stand: Min guard;Supervision         General transfer comment: Pt initially CGA with standing d/t increased effort but 2nd attempt pt stood well with RW.  Ambulation/Gait Ambulation/Gait assistance: Min guard Ambulation Distance (Feet):  (30 feet; 190 feet) Assistive device: Rolling walker (2 wheeled)   Gait velocity: mildly decreased   General Gait Details: First 30 feet pt initially with decreased B foot clearance, decreased B step length, and 1 episode of LE minor buckling but pt able to self correct with use of walker.  Pt sat on edge of bed to rest and then able to walk around nursing loop with RW and was steady without loss of balance or buckling and with good step through gait pattern.  Stairs            Wheelchair Mobility    Modified Rankin (Stroke Patients Only)       Balance Overall balance assessment: Needs assistance;History of Falls Sitting-balance support: No upper extremity supported;Feet supported Sitting balance-Leahy Scale: Good Sitting balance - Comments: reaching within BOS   Standing balance support: Bilateral upper extremity supported (on RW) Standing balance-Leahy Scale: Good Standing balance comment: with functional mobility                             Pertinent Vitals/Pain Pain Assessment: 0-10 Pain Score: 9  Pain Location: neck pain Pain Descriptors / Indicators: Sore;Tender Pain Intervention(s): Limited activity within patient's tolerance;Monitored during session;Premedicated before session;Repositioned  Vitals (  HR and O2 on room air) stable and WFL throughout treatment session.    Home Living Family/patient expects to be discharged to:: Private residence Living Arrangements: Alone Available Help at Discharge: Home health Type of Home: Apartment (Handicap apt) Home Access: Level entry     Home Layout: One level Home  Equipment: Walker - 2 wheels;Other (comment) (walking stick)      Prior Function Level of Independence: Needs assistance   Gait / Transfers Assistance Needed: Pt recently using RW d/t reporting multiple falls since surgery and feeling safer with RW.  ADL's / Homemaking Assistance Needed: Has aide that comes to his home 01-1129/noon that assists with cooking, cleaning, bathing, and dressing.        Hand Dominance        Extremity/Trunk Assessment   Upper Extremity Assessment Upper Extremity Assessment:  (at least 4/5 B elbow flexion/extension, at least 3/5 AROM B shoulder flexion to 90 degrees; good B hand grip)    Lower Extremity Assessment Lower Extremity Assessment:  (B hip flexion, knee flexion/extension, and DF at least 4/5)    Cervical / Trunk Assessment Cervical / Trunk Assessment: Normal  Communication   Communication: No difficulties  Cognition Arousal/Alertness: Awake/alert Behavior During Therapy: WFL for tasks assessed/performed Overall Cognitive Status: Within Functional Limits for tasks assessed                                        General Comments General comments (skin integrity, edema, etc.): Pt resting in bed with c-collar in place upon PT arrival.  Nursing cleared pt for participation in physical therapy.  Pt agreeable to PT session.    Exercises     Assessment/Plan    PT Assessment Patient needs continued PT services  PT Problem List Decreased strength;Decreased balance;Pain       PT Treatment Interventions DME instruction;Gait training;Functional mobility training;Therapeutic activities;Therapeutic exercise;Balance training;Patient/family education    PT Goals (Current goals can be found in the Care Plan section)  Acute Rehab PT Goals Patient Stated Goal: to go home PT Goal Formulation: With patient Time For Goal Achievement: 10/05/16 Potential to Achieve Goals: Good    Frequency Min 2X/week   Barriers to discharge         Co-evaluation               End of Session Equipment Utilized During Treatment: Gait belt;Cervical collar Activity Tolerance: Patient tolerated treatment well Patient left: in bed;with call bell/phone within reach;with bed alarm set Nurse Communication: Mobility status;Precautions PT Visit Diagnosis: History of falling (Z91.81);Difficulty in walking, not elsewhere classified (R26.2)    Time: 1610-9604 PT Time Calculation (min) (ACUTE ONLY): 23 min   Charges:   PT Evaluation $PT Eval Low Complexity: 1 Procedure PT Treatments $Therapeutic Activity: 8-22 mins   PT G CodesHendricks Limes, PT 09/21/16, 3:02 PM [phone]

## 2016-09-21 NOTE — Care Management Note (Signed)
Case Management Note  Patient Details  Name: Juan Harper MRN: [id number] Date of Birth: December 22, 1960  Subjective/Objective:        Received a call from Shaune Leeks reporting that Juan Harper does not meet Medicaid guidelines for HH-PT. Juan Kirk was updated.             Action/Plan:   Expected Discharge Date:  09/21/16               Expected Discharge Plan:     In-House Referral:     Discharge planning Services     Post Acute Care Choice:    Choice offered to:     DME Arranged:    DME Agency:     HH Arranged:    HH Agency:     Status of Service:     If discussed at [employer], dates discussed:    Additional Comments:  Aspyn Warnke A, RN 09/21/2016, 2:35 PM

## 2016-09-25 NOTE — Discharge Summary (Signed)
[facility] [place] [facility]   PATIENT NAME: Juan Harper    MR#:  [id number]  DATE OF BIRTH:  1961/03/02  DATE OF ADMISSION:  09/19/2016 ADMITTING PHYSICIAN: Ramonita Lab, MD  DATE OF DISCHARGE: 09/21/2016  4:33 PM  PRIMARY CARE PHYSICIAN: Pcp Not In System    ADMISSION DIAGNOSIS:  Weakness [R53.1] Near syncope [R55] Hypotension, unspecified hypotension type [I95.9]  DISCHARGE DIAGNOSIS:  Active Problems:   AKI (acute kidney injury) (HCC)   SECONDARY DIAGNOSIS:   Past Medical History:  Diagnosis Date  . Arthritis    NECK/ RIGHT KNEE  . Bell's palsy   . BPH (benign prostatic hyperplasia)   . Cough    chronic  . Diabetes mellitus without complication (HCC)   . Edema    legs/feet  . 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 Hypertension   . Orthopnea   . Shortness of breath dyspnea   . Sleep apnea    cpap  . Wheezing     HOSPITAL COURSE:   56 year old male with past medical history of obstructive sleep apnea, hypertension, recent cervical spine fusion, diabetes, BPH who presents to the hospital with a presyncopal episode.  1. Presyncope-secondary to dehydration and poor by mouth intake. - pt. Was admitted to the hospital and given IV fluids.  Pt. Was observed on tele and no evidence of arrhythmia's.  His orthostatic vital signs were (-) after IV fluid hydration.  He was ambulated with the help of PT who recommended Home Health services and he was discharged with that.   2. Acute on chronic renal failure - due to dehydration and poor PO intake.  - this improved and resolved with IV hydration and his Cr. Back to baseline now.     3. s/p Cervical Spinal fusion - he will cont. Neck Collar.  - he will cont. Pain control with Oxycodone.  He was seen by PT and they recommended home health services which was arranged for him prior to discharged.    4. Hyperlipidemia - he will cont. Pravachol.   5. DM - he will cont.  Glipizide.    6. BPH - he will cont. Flomax.    DISCHARGE CONDITIONS:   Stable.   CONSULTS OBTAINED:    DRUG ALLERGIES:  No Known Allergies  DISCHARGE MEDICATIONS:   Allergies as of 09/21/2016   No Known Allergies     Medication List    TAKE these medications   acetaminophen 325 MG tablet Commonly known as:  TYLENOL Take 650 mg by mouth 3 (three) times daily.   albuterol 108 (90 Base) MCG/ACT inhaler Commonly known as:  PROVENTIL HFA;VENTOLIN HFA Inhale 2 puffs into the lungs every 6 (six) hours as needed for wheezing or shortness of breath.   aspirin EC 81 MG tablet Take 81 mg by mouth daily.   gabapentin 300 MG capsule Commonly known as:  NEURONTIN Take 600 mg by mouth 3 (three) times daily.   glipiZIDE 5 MG tablet Commonly known as:  GLUCOTROL Take 1 tablet (5 mg total) by mouth 2 (two) times daily before a meal.   ipratropium 17 MCG/ACT inhaler Commonly known as:  ATROVENT HFA Inhale into the lungs 2 (two) times daily.   lisinopril-hydrochlorothiazide 20-25 MG tablet Commonly known as:  PRINZIDE,ZESTORETIC Take 1 tablet by mouth daily.   loratadine 10 MG tablet Commonly known as:  CLARITIN Take 10 mg by mouth daily.   lovastatin 20 MG tablet Commonly  known as:  MEVACOR Take 20 mg by mouth at bedtime.   metFORMIN 500 MG tablet Commonly known as:  GLUCOPHAGE Take 2 tablets (1,000 mg total) by mouth 2 (two) times daily.   oxycodone 5 MG capsule Commonly known as:  OXY-IR Take 5 mg by mouth every 6 (six) hours as needed.   sitaGLIPtin 100 MG tablet Commonly known as:  JANUVIA Take 100 mg by mouth daily.   tamsulosin 0.4 MG Caps capsule Commonly known as:  FLOMAX Take 0.4 mg by mouth.   tiotropium 18 MCG inhalation capsule Commonly known as:  SPIRIVA Place 18 mcg into inhaler and inhale daily.         DISCHARGE INSTRUCTIONS:   DIET:  Cardiac diet and Diabetic diet  DISCHARGE CONDITION:  Stable  ACTIVITY:  Activity as  tolerated  OXYGEN:  Home Oxygen: No.   Oxygen Delivery: room air  DISCHARGE LOCATION:  Home with Home Health PT.    If you experience worsening of your admission symptoms, develop shortness of breath, life threatening emergency, suicidal or homicidal thoughts you must seek medical attention immediately by calling 911 or calling your MD immediately  if symptoms less severe.  You Must read complete instructions/literature along with all the possible adverse reactions/side effects for all the Medicines you take and that have been prescribed to you. Take any new Medicines after you have completely understood and accpet all the possible adverse reactions/side effects.   Please note  You were cared for by a hospitalist during your hospital stay. If you have any questions about your discharge medications or the care you received while you were in the hospital after you are discharged, you can call the unit and asked to speak with the hospitalist on call if the hospitalist that took care of you is not available. Once you are discharged, your primary care physician will handle any further medical issues. Please note that NO REFILLS for any discharge medications will be authorized once you are discharged, as it is imperative that you return to your primary care physician (or establish a relationship with a primary care physician if you do not have one) for your aftercare needs so that they can reassess your need for medications and monitor your lab values.     Today   No further episodes of Pre-syncope overnight. Feels better. No other acute events overnight. Worked well with PT.   VITAL SIGNS:  Blood pressure 140/73, pulse 90, temperature 98.3 F (36.8 C), temperature source Oral, resp. rate 16, height 5\' 11"  (1.803 m), weight 109.8 kg (242 lb), SpO2 100 %.  I/O:  No intake or output data in the 24 hours ending 09/25/16 1644  PHYSICAL EXAMINATION:   GENERAL:  56 y.o.-year-old patient lying  in bed in no acute distress.  EYES: Pupils equal, round, reactive to light and accommodation. No scleral icterus. Extraocular muscles intact.  HEENT: Head atraumatic, normocephalic. Oropharynx and nasopharynx clear. + cervical collar in place.  NECK:  Supple, no jugular venous distention. No thyroid enlargement, no tenderness.  LUNGS: Normal breath sounds bilaterally, no wheezing, rales, rhonchi. No use of accessory muscles of respiration.  CARDIOVASCULAR: S1, S2 normal. No murmurs, rubs, or gallops.  ABDOMEN: Soft, nontender, nondistended. Bowel sounds present. No organomegaly or mass.  EXTREMITIES: No cyanosis, clubbing or edema b/l.    NEUROLOGIC: Cranial nerves II through XII are intact. No focal Motor or sensory deficits b/l. Globally weak.    PSYCHIATRIC: The patient is alert and oriented x 3.  SKIN: No obvious rash, lesion, or ulcer.   DATA REVIEW:   CBC  Recent Labs Lab 09/20/16 0504  WBC 6.2  HGB 11.3*  HCT 33.2*  PLT 249    Chemistries   Recent Labs Lab 09/20/16 0504 09/21/16 0347  NA 130* 131*  K 4.6 4.7  CL 98* 100*  CO2 25 25  GLUCOSE 352* 307*  BUN 35* 22*  CREATININE 2.13* 1.13  CALCIUM 8.4* 8.6*  AST 19  --   ALT 16*  --   ALKPHOS 68  --   BILITOT 0.3  --     Cardiac Enzymes  Recent Labs Lab 09/19/16 1623  TROPONINI <0.03    Microbiology Results  No results found for this or any previous visit.  RADIOLOGY:  No results found.    Management plans discussed with the patient, family and they are in agreement.  CODE STATUS:  Code Status History    Date Active Date Inactive Code Status Order ID Comments User Context   09/19/2016 11:53 PM 09/21/2016  7:38 PM Full Code [id number]  Ramonita Lab, MD Inpatient      TOTAL TIME TAKING CARE OF THIS PATIENT: 40 minutes.    Houston Siren M.D on 09/25/2016 at 4:44 PM  Between 7am to 6pm - Pager - [phone]  After 6pm go to [email or web address] - Social research officer, government  [facility] [place]  [facility]  Office  [phone]  CC: Primary care physician; Pcp Not In System

## 2016-11-24 ENCOUNTER — Emergency Department: Payer: Medicaid Other

## 2016-11-24 ENCOUNTER — Inpatient Hospital Stay
Admission: EM | Admit: 2016-11-24 | Discharge: 2016-11-26 | DRG: 683 | Disposition: A | Discharge status: Home / Self Care | Payer: Medicaid Other | Attending: Internal Medicine | Admitting: Internal Medicine

## 2016-11-24 DIAGNOSIS — Z7984 Long term (current) use of oral hypoglycemic drugs: Secondary | ICD-10-CM | POA: Diagnosis not present

## 2016-11-24 DIAGNOSIS — J449 Chronic obstructive pulmonary disease, unspecified: Secondary | ICD-10-CM | POA: Diagnosis present

## 2016-11-24 DIAGNOSIS — F4325 Adjustment disorder with mixed disturbance of emotions and conduct: Secondary | ICD-10-CM

## 2016-11-24 DIAGNOSIS — R55 Syncope and collapse: Secondary | ICD-10-CM | POA: Diagnosis not present

## 2016-11-24 DIAGNOSIS — G4730 Sleep apnea, unspecified: Secondary | ICD-10-CM | POA: Diagnosis present

## 2016-11-24 DIAGNOSIS — Z981 Arthrodesis status: Secondary | ICD-10-CM | POA: Diagnosis not present

## 2016-11-24 DIAGNOSIS — I129 Hypertensive chronic kidney disease with stage 1 through stage 4 chronic kidney disease, or unspecified chronic kidney disease: Secondary | ICD-10-CM | POA: Diagnosis present

## 2016-11-24 DIAGNOSIS — G4733 Obstructive sleep apnea (adult) (pediatric): Secondary | ICD-10-CM | POA: Diagnosis present

## 2016-11-24 DIAGNOSIS — E860 Dehydration: Secondary | ICD-10-CM | POA: Diagnosis present

## 2016-11-24 DIAGNOSIS — M6282 Rhabdomyolysis: Secondary | ICD-10-CM | POA: Diagnosis present

## 2016-11-24 DIAGNOSIS — N179 Acute kidney failure, unspecified: Principal | ICD-10-CM

## 2016-11-24 DIAGNOSIS — G8929 Other chronic pain: Secondary | ICD-10-CM | POA: Diagnosis present

## 2016-11-24 DIAGNOSIS — Y92002 Bathroom of unspecified non-institutional (private) residence single-family (private) house as the place of occurrence of the external cause: Secondary | ICD-10-CM | POA: Diagnosis not present

## 2016-11-24 DIAGNOSIS — Z961 Presence of intraocular lens: Secondary | ICD-10-CM | POA: Diagnosis present

## 2016-11-24 DIAGNOSIS — E1122 Type 2 diabetes mellitus with diabetic chronic kidney disease: Secondary | ICD-10-CM | POA: Diagnosis present

## 2016-11-24 DIAGNOSIS — N400 Enlarged prostate without lower urinary tract symptoms: Secondary | ICD-10-CM | POA: Diagnosis present

## 2016-11-24 DIAGNOSIS — M542 Cervicalgia: Secondary | ICD-10-CM | POA: Diagnosis present

## 2016-11-24 DIAGNOSIS — I10 Essential (primary) hypertension: Secondary | ICD-10-CM | POA: Diagnosis present

## 2016-11-24 DIAGNOSIS — E118 Type 2 diabetes mellitus with unspecified complications: Secondary | ICD-10-CM

## 2016-11-24 DIAGNOSIS — R42 Dizziness and giddiness: Secondary | ICD-10-CM

## 2016-11-24 DIAGNOSIS — M1990 Unspecified osteoarthritis, unspecified site: Secondary | ICD-10-CM | POA: Diagnosis present

## 2016-11-24 DIAGNOSIS — Z79899 Other long term (current) drug therapy: Secondary | ICD-10-CM | POA: Diagnosis not present

## 2016-11-24 DIAGNOSIS — I950 Idiopathic hypotension: Secondary | ICD-10-CM | POA: Diagnosis not present

## 2016-11-24 DIAGNOSIS — Z7982 Long term (current) use of aspirin: Secondary | ICD-10-CM | POA: Diagnosis not present

## 2016-11-24 DIAGNOSIS — F1410 Cocaine abuse, uncomplicated: Secondary | ICD-10-CM | POA: Diagnosis present

## 2016-11-24 DIAGNOSIS — W19XXXA Unspecified fall, initial encounter: Secondary | ICD-10-CM | POA: Diagnosis present

## 2016-11-24 DIAGNOSIS — N189 Chronic kidney disease, unspecified: Secondary | ICD-10-CM | POA: Diagnosis present

## 2016-11-24 DIAGNOSIS — F22 Delusional disorders: Secondary | ICD-10-CM | POA: Diagnosis present

## 2016-11-24 DIAGNOSIS — F17210 Nicotine dependence, cigarettes, uncomplicated: Secondary | ICD-10-CM | POA: Diagnosis present

## 2016-11-24 DIAGNOSIS — Z9841 Cataract extraction status, right eye: Secondary | ICD-10-CM

## 2016-11-24 DIAGNOSIS — E119 Type 2 diabetes mellitus without complications: Secondary | ICD-10-CM

## 2016-11-24 LAB — LACTIC ACID, PLASMA
LACTIC ACID, VENOUS: 2.6 mmol/L — AB (ref 0.5–1.9)
LACTIC ACID, VENOUS: 2.2 mmol/L — AB (ref 0.5–1.9)

## 2016-11-24 LAB — CBC WITH DIFFERENTIAL/PLATELET
BASOS ABS: 0.1 10*3/uL (ref 0–0.1)
BASOS PCT: 1 %
EOS ABS: 0.1 10*3/uL (ref 0–0.7)
EOS PCT: 1 %
HCT: 40.9 % (ref 40.0–52.0)
Hemoglobin: 13.8 g/dL (ref 13.0–18.0)
Lymphocytes Relative: 22 %
Lymphs Abs: 1.8 10*3/uL (ref 1.0–3.6)
MCH: 29.6 pg (ref 26.0–34.0)
MCHC: 33.8 g/dL (ref 32.0–36.0)
MCV: 87.5 fL (ref 80.0–100.0)
MONO ABS: 1.5 10*3/uL — AB (ref 0.2–1.0)
Monocytes Relative: 18 %
Neutro Abs: 4.7 10*3/uL (ref 1.4–6.5)
Neutrophils Relative %: 58 %
PLATELETS: 275 10*3/uL (ref 150–440)
RBC: 4.67 MIL/uL (ref 4.40–5.90)
RDW: 15.4 % — AB (ref 11.5–14.5)
WBC: 8.2 10*3/uL (ref 3.8–10.6)

## 2016-11-24 LAB — COMPREHENSIVE METABOLIC PANEL
ALT: 24 U/L (ref 17–63)
ANION GAP: 14 (ref 5–15)
AST: 52 U/L — ABNORMAL HIGH (ref 15–41)
Albumin: 3.8 g/dL (ref 3.5–5.0)
Alkaline Phosphatase: 59 U/L (ref 38–126)
BILIRUBIN TOTAL: 0.8 mg/dL (ref 0.3–1.2)
BUN: 38 mg/dL — ABNORMAL HIGH (ref 6–20)
CALCIUM: 9.3 mg/dL (ref 8.9–10.3)
CO2: 22 mmol/L (ref 22–32)
Chloride: 100 mmol/L — ABNORMAL LOW (ref 101–111)
Creatinine, Ser: 4.9 mg/dL — ABNORMAL HIGH (ref 0.61–1.24)
GFR calc non Af Amer: 12 mL/min — ABNORMAL LOW (ref >60)
GFR, EST AFRICAN AMERICAN: 14 mL/min — AB (ref >60)
Glucose, Bld: 126 mg/dL — ABNORMAL HIGH (ref 65–99)
Potassium: 4.3 mmol/L (ref 3.5–5.1)
SODIUM: 136 mmol/L (ref 135–145)
TOTAL PROTEIN: 8 g/dL (ref 6.5–8.1)

## 2016-11-24 LAB — TROPONIN I: Troponin I: <0.03 ng/mL (ref <0.03)

## 2016-11-24 LAB — GLUCOSE, CAPILLARY: Glucose-Capillary: 158 mg/dL — ABNORMAL HIGH (ref 65–99)

## 2016-11-24 MED ORDER — SODIUM CHLORIDE 0.9 % IV SOLN
Freq: Once | INTRAVENOUS | Status: AC
  Administered 2016-11-24: 20:00:00 via INTRAVENOUS

## 2016-11-24 MED ORDER — ONDANSETRON HCL 4 MG/2ML IJ SOLN: 4.0000 mg | Freq: Four times a day (QID) | INTRAMUSCULAR | Status: DC | PRN

## 2016-11-24 MED ORDER — INSULIN ASPART 100 UNIT/ML ~~LOC~~ SOLN
0.0000 [IU] | Freq: Three times a day (TID) | SUBCUTANEOUS | Status: DC
  Administered 2016-11-25: 2 [IU] via SUBCUTANEOUS
  Administered 2016-11-25: 1 [IU] via SUBCUTANEOUS
  Administered 2016-11-25: 2 [IU] via SUBCUTANEOUS
  Administered 2016-11-26: 1 [IU] via SUBCUTANEOUS
  Filled 2016-11-24 (×4): qty 1

## 2016-11-24 MED ORDER — ACETAMINOPHEN 650 MG RE SUPP: 650.0000 mg | Freq: Four times a day (QID) | RECTAL | Status: DC | PRN

## 2016-11-24 MED ORDER — OXYCODONE HCL 5 MG PO TABS
5.0000 mg | ORAL_TABLET | Freq: Four times a day (QID) | ORAL | Status: DC | PRN
  Administered 2016-11-25 (×3): 5 mg via ORAL
  Filled 2016-11-24 (×3): qty 1

## 2016-11-24 MED ORDER — TIOTROPIUM BROMIDE MONOHYDRATE 18 MCG IN CAPS
18.0000 ug | ORAL_CAPSULE | Freq: Every day | RESPIRATORY_TRACT | Status: DC
  Administered 2016-11-25 – 2016-11-26 (×2): 18 ug via RESPIRATORY_TRACT
  Filled 2016-11-24: qty 5

## 2016-11-24 MED ORDER — ACETAMINOPHEN 325 MG PO TABS
650.0000 mg | ORAL_TABLET | Freq: Once | ORAL | Status: AC
  Administered 2016-11-24: 650 mg via ORAL
  Filled 2016-11-24: qty 2

## 2016-11-24 MED ORDER — INSULIN ASPART 100 UNIT/ML ~~LOC~~ SOLN: 0.0000 [IU] | Freq: Every day | SUBCUTANEOUS | Status: DC

## 2016-11-24 MED ORDER — ONDANSETRON HCL 4 MG PO TABS: 4.0000 mg | ORAL_TABLET | Freq: Four times a day (QID) | ORAL | Status: DC | PRN

## 2016-11-24 MED ORDER — ORAL CARE MOUTH RINSE
15.0000 mL | Freq: Two times a day (BID) | OROMUCOSAL | Status: DC
  Administered 2016-11-25 – 2016-11-26 (×3): 15 mL via OROMUCOSAL

## 2016-11-24 MED ORDER — SODIUM CHLORIDE 0.9 % IV BOLUS (SEPSIS)
1000.0000 mL | Freq: Once | INTRAVENOUS | Status: AC
  Administered 2016-11-25: 1000 mL via INTRAVENOUS

## 2016-11-24 MED ORDER — ACETAMINOPHEN 325 MG PO TABS
650.0000 mg | ORAL_TABLET | Freq: Four times a day (QID) | ORAL | Status: DC | PRN
  Filled 2016-11-24: qty 2

## 2016-11-24 MED ORDER — PRAVASTATIN SODIUM 20 MG PO TABS
20.0000 mg | ORAL_TABLET | Freq: Every day | ORAL | Status: DC
  Administered 2016-11-25: 20 mg via ORAL
  Filled 2016-11-24: qty 1

## 2016-11-24 MED ORDER — SODIUM CHLORIDE 0.9 % IV SOLN
INTRAVENOUS | Status: AC
  Administered 2016-11-25: 02:00:00 via INTRAVENOUS

## 2016-11-24 MED ORDER — ASPIRIN EC 81 MG PO TBEC
81.0000 mg | DELAYED_RELEASE_TABLET | Freq: Every day | ORAL | Status: DC
  Administered 2016-11-25 – 2016-11-26 (×2): 81 mg via ORAL
  Filled 2016-11-24 (×2): qty 1

## 2016-11-24 MED ORDER — GABAPENTIN 300 MG PO CAPS
600.0000 mg | ORAL_CAPSULE | Freq: Three times a day (TID) | ORAL | Status: DC
  Administered 2016-11-25 – 2016-11-26 (×4): 600 mg via ORAL
  Filled 2016-11-24 (×4): qty 2

## 2016-11-24 MED ORDER — TAMSULOSIN HCL 0.4 MG PO CAPS
0.4000 mg | ORAL_CAPSULE | Freq: Every day | ORAL | Status: DC
  Administered 2016-11-25 – 2016-11-26 (×2): 0.4 mg via ORAL
  Filled 2016-11-24 (×2): qty 1

## 2016-11-24 MED ORDER — HEPARIN SODIUM (PORCINE) 5000 UNIT/ML IJ SOLN
5000.0000 [IU] | Freq: Three times a day (TID) | INTRAMUSCULAR | Status: DC
  Administered 2016-11-25 (×2): 5000 [IU] via SUBCUTANEOUS
  Filled 2016-11-24 (×3): qty 1

## 2016-11-24 NOTE — H&P (Signed)
[facility] [place] at [facility]   PATIENT NAME: Juan Harper    MR#:  [id number]  DATE OF BIRTH:  10/10/1960  DATE OF ADMISSION:  11/24/2016  PRIMARY CARE PHYSICIAN: System, Pcp Not In   REQUESTING/REFERRING PHYSICIAN: Darnelle CatalanMalinda, MD  CHIEF COMPLAINT:   Chief Complaint  Patient presents with  . Fall    HISTORY OF PRESENT ILLNESS:  Juan Harper  is a 56 y.o. male who presents with A fall in his bathroom. Patient refuses to contribute much information to his history of present illness. He states very simply that he fell in his bathroom last night, and has had significant pain since then. Here in the ED he was found to have acute on chronic renal failure, and low blood pressure. Patient states he did not lose consciousness or have any chest pain or palpitations. Unclear etiology of his fall, though he does have a history of being hospitalized for renal failure and weakness due to poor by mouth intake and hypotension. Hospitalists were called for admission  PAST MEDICAL HISTORY:   Past Medical History:  Diagnosis Date  . Arthritis    NECK/ RIGHT KNEE  . Bell's palsy   . BPH (benign prostatic hyperplasia)   . Cough    chronic  . Diabetes mellitus without complication (HCC)   . Edema    legs/feet  . 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 Hypertension   . Orthopnea   . Shortness of breath dyspnea   . Sleep apnea    cpap  . Wheezing     PAST SURGICAL HISTORY:   Past Surgical History:  Procedure Laterality Date  . BACK SURGERY    . CATARACT EXTRACTION W/PHACO Right 07/31/2016   Procedure: CATARACT EXTRACTION PHACO AND INTRAOCULAR LENS PLACEMENT (IOC);  Surgeon: Nevada CraneBradley Mark King, MD;  Location: [facility] ORS;  Service: Ophthalmology;  Laterality: Right;  Lot # [id number] H US:00:26.9 AP%:6.8% CDE: 1.83  . COLONOSCOPY    . NECK MASS EXCISION    . PILONIDAL CYST EXCISION      SOCIAL HISTORY:   Social History  Substance Use Topics  .  Smoking status: Current Every Day Smoker    Packs/day: 0.50    Types: Cigarettes  . Smokeless tobacco: Never Used  . Alcohol use No    FAMILY HISTORY:  No family history on file.  DRUG ALLERGIES:  No Known Allergies  MEDICATIONS AT HOME:   Prior to Admission medications   Medication Sig Start Date End Date Taking? Authorizing Provider  aspirin EC 81 MG tablet Take 81 mg by mouth daily.   Yes [provider]  gabapentin (NEURONTIN) 300 MG capsule Take 600 mg by mouth 3 (three) times daily.    Yes [provider]  glipiZIDE (GLUCOTROL) 5 MG tablet Take 1 tablet (5 mg total) by mouth 2 (two) times daily before a meal. 09/05/16  Yes Willy Eddyobinson, Patrick, MD  ipratropium (ATROVENT HFA) 17 MCG/ACT inhaler Inhale into the lungs 2 (two) times daily.   Yes [provider]  lisinopril-hydrochlorothiazide (PRINZIDE,ZESTORETIC) 20-25 MG tablet Take 1 tablet by mouth daily.   Yes [provider]  loratadine (CLARITIN) 10 MG tablet Take 10 mg by mouth daily.   Yes [provider]  lovastatin (MEVACOR) 20 MG tablet Take 20 mg by mouth at bedtime.   Yes [provider]  metFORMIN (GLUCOPHAGE) 500 MG tablet Take 2 tablets (1,000 mg total) by mouth 2 (two) times  daily. 09/05/16  Yes Willy Eddy, MD  sitaGLIPtin (JANUVIA) 100 MG tablet Take 100 mg by mouth daily.   Yes [provider]  tamsulosin (FLOMAX) 0.4 MG CAPS capsule Take 0.4 mg by mouth.   Yes [provider]  tiotropium (SPIRIVA) 18 MCG inhalation capsule Place 18 mcg into inhaler and inhale daily. 07/16/16 07/16/17 Yes [provider]  acetaminophen (TYLENOL) 325 MG tablet Take 650 mg by mouth 3 (three) times daily.    [provider]  albuterol (PROVENTIL HFA;VENTOLIN HFA) 108 (90 Base) MCG/ACT inhaler Inhale 2 puffs into the lungs every 6 (six) hours as needed for wheezing or shortness of breath.    [provider]  oxycodone (OXY-IR) 5 MG  capsule Take 5 mg by mouth every 6 (six) hours as needed.    [provider]    REVIEW OF SYSTEMS:  Review of Systems  Constitutional: Negative for chills, fever, malaise/fatigue and weight loss.  HENT: Negative for ear pain, hearing loss and tinnitus.   Eyes: Negative for blurred vision, double vision, pain and redness.  Respiratory: Negative for cough, hemoptysis and shortness of breath.   Cardiovascular: Negative for chest pain, palpitations, orthopnea and leg swelling.  Gastrointestinal: Negative for abdominal pain, constipation, diarrhea, nausea and vomiting.  Genitourinary: Negative for dysuria, frequency and hematuria.  Musculoskeletal: Positive for back pain, falls and neck pain. Negative for joint pain.  Skin:       No acne, rash, or lesions  Neurological: Negative for dizziness, tremors, focal weakness and weakness.  Endo/Heme/Allergies: Negative for polydipsia. Does not bruise/bleed easily.  Psychiatric/Behavioral: Negative for depression. The patient is not nervous/anxious and does not have insomnia.      VITAL SIGNS:   Vitals:   11/24/16 1725 11/24/16 1800 11/24/16 1825 11/24/16 2020  BP:   (!) 163/135 105/64  Pulse: (!) 112 (!) 106  90  Resp: 19 20 (!) 23 15  Temp:      TempSrc:      SpO2: 98% 100%  99%  Weight:      Height:       Wt Readings from Last 3 Encounters:  11/24/16 104.3 kg (230 lb)  09/19/16 109.8 kg (242 lb)  09/05/16 109.8 kg (242 lb)    PHYSICAL EXAMINATION:  Physical Exam  Vitals reviewed. Constitutional: He is oriented to person, place, and time. He appears well-developed and well-nourished. No distress.  HENT:  Head: Normocephalic and atraumatic.  Dry mucous membranes  Eyes: Conjunctivae and EOM are normal. Pupils are equal, round, and reactive to light. No scleral icterus.  Neck: Normal range of motion. Neck supple. No JVD present. No thyromegaly present.  Cardiovascular: Normal rate, regular rhythm and intact distal pulses.   Exam reveals no gallop and no friction rub.   No murmur heard. Respiratory: Effort normal and breath sounds normal. No respiratory distress. He has no wheezes. He has no rales.  GI: Soft. Bowel sounds are normal. He exhibits no distension. There is no tenderness.  Musculoskeletal: Normal range of motion. He exhibits no edema.  No arthritis, no gout  Lymphadenopathy:    He has no cervical adenopathy.  Neurological: He is alert and oriented to person, place, and time. No cranial nerve deficit.  No dysarthria, no aphasia  Skin: Skin is warm and dry. No rash noted. No erythema.  Psychiatric: He has a normal mood and affect. His behavior is normal. Judgment and thought content normal.    LABORATORY PANEL:   CBC  Recent Labs  Lab 11/24/16 1934  WBC 8.2  HGB 13.8  HCT 40.9  PLT 275   ------------------------------------------------------------------------------------------------------------------  Chemistries   Recent Labs Lab 11/24/16 1934  NA 136  K 4.3  CL 100*  CO2 22  GLUCOSE 126*  BUN 38*  CREATININE 4.90*  CALCIUM 9.3  AST 52*  ALT 24  ALKPHOS 59  BILITOT 0.8   ------------------------------------------------------------------------------------------------------------------  Cardiac Enzymes  Recent Labs Lab 11/24/16 1934  TROPONINI <0.03   ------------------------------------------------------------------------------------------------------------------  RADIOLOGY:  Dg Chest 2 View  Result Date: 11/24/2016 CLINICAL DATA:  Body aches following fall. EXAM: CHEST  2 VIEW COMPARISON:  10/31/2015 chest radiograph FINDINGS: Upper limits normal heart size and mild peribronchial thickening again noted. There is no evidence of focal airspace disease, pulmonary edema, suspicious pulmonary nodule/mass, pleural effusion, or pneumothorax. No acute bony abnormalities are identified. Remote left rib fractures are present. IMPRESSION: No evidence of acute cardiopulmonary  disease. Electronically Signed   By: Harmon Pier M.D.   On: 11/24/2016 18:23   Ct Head Wo Contrast  Result Date: 11/24/2016 CLINICAL DATA:  Status post fall last night with head and neck pain. Patient has a history of prior neck surgery at [facility]. EXAM: CT HEAD WITHOUT CONTRAST CT CERVICAL SPINE WITHOUT CONTRAST TECHNIQUE: Multidetector CT imaging of the head and cervical spine was performed following the standard protocol without intravenous contrast. Multiplanar CT image reconstructions of the cervical spine were also generated. COMPARISON:  None. FINDINGS: CT HEAD FINDINGS Brain: No evidence of acute infarction, hemorrhage, hydrocephalus, extra-axial collection or mass lesion/mass effect. Vascular: No hyperdense vessel or unexpected calcification. Skull: Normal. Negative for fracture or focal lesion. Sinuses/Orbits: No acute finding. Other: None. CT CERVICAL SPINE FINDINGS Alignment: There is straightening of cervical spine. Skull base and vertebrae: The patient status post prior anterior fusion of C3 through C5 without malalignment. There are degenerative joint changes of the lower cervical spine with narrowed joint space and osteophyte formation. Soft tissues and spinal canal: Unremarkable on this noncontrast exam. Disc levels: Degenerative joint changes with narrowed disc space and osteophyte formation are identified in the lower cervical spine. Upper chest: Unremarkable. Other: Negative. IMPRESSION: No focal acute intracranial abnormality identified. No acute fracture or dislocation of cervical spine. Postsurgical changes of C3 through C5 without malalignment. Electronically Signed   By: Sherian Rein M.D.   On: 11/24/2016 18:10   Ct Cervical Spine Wo Contrast  Result Date: 11/24/2016 CLINICAL DATA:  Status post fall last night with head and neck pain. Patient has a history of prior neck surgery at [facility]. EXAM: CT HEAD WITHOUT CONTRAST CT CERVICAL SPINE WITHOUT CONTRAST TECHNIQUE: Multidetector CT imaging of  the head and cervical spine was performed following the standard protocol without intravenous contrast. Multiplanar CT image reconstructions of the cervical spine were also generated. COMPARISON:  None. FINDINGS: CT HEAD FINDINGS Brain: No evidence of acute infarction, hemorrhage, hydrocephalus, extra-axial collection or mass lesion/mass effect. Vascular: No hyperdense vessel or unexpected calcification. Skull: Normal. Negative for fracture or focal lesion. Sinuses/Orbits: No acute finding. Other: None. CT CERVICAL SPINE FINDINGS Alignment: There is straightening of cervical spine. Skull base and vertebrae: The patient status post prior anterior fusion of C3 through C5 without malalignment. There are degenerative joint changes of the lower cervical spine with narrowed joint space and osteophyte formation. Soft tissues and spinal canal: Unremarkable on this noncontrast exam. Disc levels: Degenerative joint changes with narrowed disc space and osteophyte formation are identified in the lower cervical spine. Upper chest: Unremarkable. Other: Negative. IMPRESSION:  No focal acute intracranial abnormality identified. No acute fracture or dislocation of cervical spine. Postsurgical changes of C3 through C5 without malalignment. Electronically Signed   By: Sherian Rein M.D.   On: 11/24/2016 18:10    EKG:   Orders placed or performed during the hospital encounter of 11/24/16  . EKG 12-Lead  . EKG 12-Lead  . EKG 12-Lead  . EKG 12-Lead  . ED EKG  . ED EKG  . EKG 12-Lead  . EKG 12-Lead    IMPRESSION AND PLAN:  Principal Problem:   Acute on chronic renal failure (HCC) - patient has significant only dehydrated. We will hydrate him with IV fluids, avoid nephrotoxins, and monitor for improvement Active Problems:   Postural dizziness with presyncope - suspect this is likely due to his low blood pressure from his poor by mouth intake and dehydration. However, we'll keep him on telemetry to monitor for any  potential arrhythmia, and get an echocardiogram in the morning.   Diabetes (HCC) - sliding scale insulin with corresponding glucose checks   HTN (hypertension) - hold home meds for now as the patient is borderline hypotensive   Sleep apnea - CPAP daily at bedtime  All the records are reviewed and case discussed with ED provider. Management plans discussed with the patient and/or family.  DVT PROPHYLAXIS: SubQ heparin  GI PROPHYLAXIS: None  ADMISSION STATUS: Inpatient  CODE STATUS: Full Code Status History    Date Active Date Inactive Code Status Order ID Comments User Context   09/19/2016 11:53 PM 09/21/2016  7:38 PM Full Code [id number]  Ramonita Lab, MD Inpatient      TOTAL TIME TAKING CARE OF THIS PATIENT: 45 minutes.   Juan Harper FIELDING 11/24/2016, 9:38 PM  [facility]  Office  [phone]  CC: Primary care physician; System, Pcp Not In  Note:  This document was prepared using Dragon voice recognition software and may include unintentional dictation errors.

## 2016-11-24 NOTE — ED Notes (Addendum)
Entered room to collect urine; pt asleep and snoring. Pt refused to be cooperative in urine collection. Pt shuts eyes and begin snoring.

## 2016-11-24 NOTE — ED Notes (Signed)
Attempted to get a urine from pt. Pt unable to produce a urine independently. Pt refuses catheter. Pt states he wasn't going to pee until he received water and demands nurse to "get the doctor now." MD verified that pt could receive water; pt given water by [employer].

## 2016-11-24 NOTE — ED Provider Notes (Signed)
[facility] Emergency Department Provider Note   ____________________________________________   First MD Initiated Contact with Patient 11/24/16 1711     (approximate)  I have reviewed the triage vital signs and the nursing notes.   HISTORY  Chief Complaint Fall    HPI Juan Harper is a 56 y.o. male patient reports he fell last night and hit his head in the bathroom. He's had recent surgery on his neck. Has a plate there. He said now he's having a headache and aching all the way down to the middle of his back. He thinks he might of had low blood sugar last night as he was shaking all over when he fell. He did not pass out. Aside from aching all over now he feels okay.   Past Medical History:  Diagnosis Date  . Arthritis    NECK/ RIGHT KNEE  . Bell's palsy   . BPH (benign prostatic hyperplasia)   . Cough    chronic  . Diabetes mellitus without complication (HCC)   . Edema    legs/feet  . 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 Hypertension   . Orthopnea   . Shortness of breath dyspnea   . Sleep apnea    cpap  . Wheezing     Patient Active Problem List   Diagnosis Date Noted  . AKI (acute kidney injury) (HCC) 09/19/2016    Past Surgical History:  Procedure Laterality Date  . BACK SURGERY    . CATARACT EXTRACTION W/PHACO Right 07/31/2016   Procedure: CATARACT EXTRACTION PHACO AND INTRAOCULAR LENS PLACEMENT (IOC);  Surgeon: Nevada Crane, MD;  Location: [facility] ORS;  Service: Ophthalmology;  Laterality: Right;  Lot # [id number] H US:00:26.9 AP%:6.8% CDE: 1.83  . COLONOSCOPY    . NECK MASS EXCISION    . PILONIDAL CYST EXCISION      Prior to Admission medications   Medication Sig Start Date End Date Taking? Authorizing Provider  acetaminophen (TYLENOL) 325 MG tablet Take 650 mg by mouth 3 (three) times daily.    [provider]  albuterol (PROVENTIL HFA;VENTOLIN HFA) 108 (90 Base) MCG/ACT inhaler Inhale 2  puffs into the lungs every 6 (six) hours as needed for wheezing or shortness of breath.    [provider]  aspirin EC 81 MG tablet Take 81 mg by mouth daily.    [provider]  gabapentin (NEURONTIN) 300 MG capsule Take 600 mg by mouth 3 (three) times daily.     [provider]  glipiZIDE (GLUCOTROL) 5 MG tablet Take 1 tablet (5 mg total) by mouth 2 (two) times daily before a meal. 09/05/16   Willy Eddy, MD  ipratropium (ATROVENT HFA) 17 MCG/ACT inhaler Inhale into the lungs 2 (two) times daily.    [provider]  lisinopril-hydrochlorothiazide (PRINZIDE,ZESTORETIC) 20-25 MG tablet Take 1 tablet by mouth daily.    [provider]  loratadine (CLARITIN) 10 MG tablet Take 10 mg by mouth daily.    [provider]  lovastatin (MEVACOR) 20 MG tablet Take 20 mg by mouth at bedtime.    [provider]  metFORMIN (GLUCOPHAGE) 500 MG tablet Take 2 tablets (1,000 mg total) by mouth 2 (two) times daily. 09/05/16   Willy Eddy, MD  oxycodone (OXY-IR) 5 MG capsule Take 5 mg by mouth every 6 (six) hours as needed.    [provider]  sitaGLIPtin (JANUVIA) 100 MG tablet Take 100 mg by mouth daily.  [provider]  tamsulosin (FLOMAX) 0.4 MG CAPS capsule Take 0.4 mg by mouth.    [provider]  tiotropium (SPIRIVA) 18 MCG inhalation capsule Place 18 mcg into inhaler and inhale daily. 07/16/16 07/16/17  [provider]    Allergies Patient has no known allergies.  No family history on file.  Social History Social History  Substance Use Topics  . Smoking status: Current Every Day Smoker    Packs/day: 0.50    Types: Cigarettes  . Smokeless tobacco: Never Used  . Alcohol use No    Review of Systems  Constitutional: No fever/chills Eyes: No visual changes. ENT: No sore throat. Cardiovascular: Denies chest pain. Respiratory: Denies shortness of breath. Gastrointestinal: No abdominal pain.   No nausea, no vomiting.  No diarrhea.  No constipation. Genitourinary: Negative for dysuria. Musculoskeletal: See history of present illness Skin: Negative for rash. Neurological: Negative for new focal weakness or numbness.   ____________________________________________   PHYSICAL EXAM:  VITAL SIGNS: ED Triage Vitals  Enc Vitals Group     BP 11/24/16 1701 104/67     Pulse Rate 11/24/16 1703 (!) 118     Resp 11/24/16 1703 14     Temp 11/24/16 1703 99.6 F (37.6 C)     Temp Source 11/24/16 1703 Oral     SpO2 11/24/16 1704 94 %     Weight 11/24/16 1703 230 lb (104.3 kg)     Height 11/24/16 1703 5\' 8"  (1.727 m)     Head Circumference --      Peak Flow --      Pain Score 11/24/16 1701 10     Pain Loc --      Pain Edu? --      Excl. in GC? --     Constitutional: Alert and oriented. Well appearing and in no acute distress. Eyes: Conjunctivae are normal.  Head: Atraumatic. Nose: No congestion/rhinnorhea. Mouth/Throat: Mucous membranes are moist.  Oropharynx non-erythematous. Neck: No stridor.   Cardiovascular: Normal rate, regular rhythm. Grossly normal heart sounds.  Good peripheral circulation. Respiratory: Normal respiratory effort.  No retractions. Lungs CTAB. Gastrointestinal: Soft and nontender. No distention. No abdominal bruits. No CVA tenderness. Musculoskeletal: No lower extremity tenderness nor edema.  No joint effusions. Neurologic:  Normal speech and language. No gross focal neurologic deficits are appreciated.Patient does not admit to any numbness on testing and I cannot find any motor weakness in his arms and legs Skin:  Skin is warm, dry and intact. No rash noted. Psychiatric: Mood and affect are normal. Speech and behavior are normal.  ____________________________________________   LABS (all labs ordered are listed, but only abnormal results are displayed)  Labs Reviewed  LACTIC ACID, PLASMA - Abnormal; Notable for the following:       Result Value    Lactic Acid, Venous 2.6 (*)    All other components within normal limits  CBC WITH DIFFERENTIAL/PLATELET - Abnormal; Notable for the following:    RDW 15.4 (*)    Monocytes Absolute 1.5 (*)    All other components within normal limits  COMPREHENSIVE METABOLIC PANEL  TROPONIN I  LACTIC ACID, PLASMA  URINALYSIS, COMPLETE (UACMP) WITH MICROSCOPIC   ____________________________________________  EKG EKG read and interpreted by me shows normal sinus rhythm rate of 95 normal axis nonspecific ST-T wave changes, regional premature atrial complexes.  ____________________________________________  RADIOLOGY  IMPRESSION: No focal acute intracranial abnormality identified.  No acute fracture or dislocation of cervical spine. Postsurgical changes of C3 through C5 without  malalignment.   Electronically Signed   By: Sherian Rein M.D.   On: 11/24/2016 18:10  _IMPRESSION: No evidence of acute cardiopulmonary disease.   Electronically Signed   By: Harmon Pier M.D.   On: 11/24/2016 18:23___________________________________________   PROCEDURES  Procedure(s) performed:  Procedures  Critical Care performed:   ____________________________________________   INITIAL IMPRESSION / ASSESSMENT AND PLAN / ED COURSE  Pertinent labs & imaging results that were available during my care of the patient were reviewed by me and considered in my medical decision making (see chart for details).  Patient does not feel good his blood pressure continues to be low in spite of IV fluids lactic acid is elevated I will put him in the hospital.atient has not had any pain medication      ____________________________________________   FINAL CLINICAL IMPRESSION(S) / ED DIAGNOSES  Final diagnoses:  Fall, initial encounter  Idiopathic hypotension      NEW MEDICATIONS STARTED DURING THIS VISIT:  New Prescriptions   No medications on file     Note:  This document was prepared using  Dragon voice recognition software and may include unintentional dictation errors.    Arnaldo Natal, MD 11/24/16 [phone]

## 2016-11-24 NOTE — ED Triage Notes (Signed)
[facility].  States pt fell last night and told EMS that he feels like he had a "diabetic seizure".  Pt's blood sugar was 187 per EMS, vitals 170/100 HR 90s.  Pt on 2LNC in ER, but denies wearing O2 at home, states that he normally uses a CPAP at night and when he has to lay flat for too long.  Pt argumentative and uncooperative with ER staff as well as EMS.  Pt states that he fell on his face last night, but was able to walk around last night and today.  Pt states that he hurts from the top of his neck down his back.  [facility] and is still in a neck brace upon arrival to ER.

## 2016-11-24 NOTE — ED Notes (Signed)
Report off to kailey rn  

## 2016-11-24 NOTE — ED Notes (Signed)
Pt very lethargic. Pt alert to voice. Pt asked orientation question. Pt states is is 2017. Pt asked who the president is, pt states, "trump, who you voted for." Pt was alert to self and place.

## 2016-11-24 NOTE — ED Notes (Signed)
Pt has a neck brace in place.  Pt had surgery on neck at [facility].   Pt reports falling last night and has pain in head, neck.  Pt fell over a toilet and hit the tub.  No loc.  Pt alert and moving all extremities. Speech clear.  afib on monitor.

## 2016-11-24 NOTE — ED Notes (Addendum)
Pt demands to go to the bathroom. Pt made aware he was not in a condition where is was safe to get up and walk. Pt states "Im going to use the bathroom with or without you." Pt assisted to the bathroom in room [facility] [employer]. Pt very unstable on his feet and a gait belt as well as two full assistance required. Pt safely placed back in bed.  MD made aware of pt being symptomatically hypotensive. MD gave verbal order for IV, blood work, urinalysis, and NS bolus.

## 2016-11-25 ENCOUNTER — Inpatient Hospital Stay (HOSPITAL_COMMUNITY)
Admit: 2016-11-25 | Discharge: 2016-11-25 | Disposition: A | Discharge status: Home / Self Care | Payer: Medicaid Other | Attending: Internal Medicine | Admitting: Internal Medicine

## 2016-11-25 ENCOUNTER — Inpatient Hospital Stay: Payer: Medicaid Other

## 2016-11-25 DIAGNOSIS — R55 Syncope and collapse: Secondary | ICD-10-CM

## 2016-11-25 DIAGNOSIS — F4325 Adjustment disorder with mixed disturbance of emotions and conduct: Secondary | ICD-10-CM

## 2016-11-25 LAB — GLUCOSE, CAPILLARY
GLUCOSE-CAPILLARY: 157 mg/dL — AB (ref 65–99)
GLUCOSE-CAPILLARY: 130 mg/dL — AB (ref 65–99)
Glucose-Capillary: 150 mg/dL — ABNORMAL HIGH (ref 65–99)
Glucose-Capillary: 132 mg/dL — ABNORMAL HIGH (ref 65–99)
Glucose-Capillary: 194 mg/dL — ABNORMAL HIGH (ref 65–99)

## 2016-11-25 LAB — BASIC METABOLIC PANEL
ANION GAP: 9 (ref 5–15)
BUN: 43 mg/dL — AB (ref 6–20)
CO2: 25 mmol/L (ref 22–32)
Calcium: 8.3 mg/dL — ABNORMAL LOW (ref 8.9–10.3)
Chloride: 103 mmol/L (ref 101–111)
Creatinine, Ser: 4.31 mg/dL — ABNORMAL HIGH (ref 0.61–1.24)
GFR, EST AFRICAN AMERICAN: 16 mL/min — AB (ref >60)
GFR, EST NON AFRICAN AMERICAN: 14 mL/min — AB (ref >60)
Glucose, Bld: 167 mg/dL — ABNORMAL HIGH (ref 65–99)
POTASSIUM: 4.2 mmol/L (ref 3.5–5.1)
SODIUM: 137 mmol/L (ref 135–145)

## 2016-11-25 LAB — URINALYSIS, COMPLETE (UACMP) WITH MICROSCOPIC
Bacteria, UA: NONE SEEN
Bilirubin Urine: NEGATIVE
GLUCOSE, UA: NEGATIVE mg/dL
Hgb urine dipstick: NEGATIVE
KETONES UR: NEGATIVE mg/dL
Leukocytes, UA: NEGATIVE
NITRITE: NEGATIVE
PH: 5 (ref 5.0–8.0)
Protein, ur: 30 mg/dL — AB
SPECIFIC GRAVITY, URINE: 1.019 (ref 1.005–1.030)

## 2016-11-25 LAB — URINE DRUG SCREEN, QUALITATIVE (ARMC ONLY)
AMPHETAMINES, UR SCREEN: NOT DETECTED
BENZODIAZEPINE, UR SCRN: NOT DETECTED
Barbiturates, Ur Screen: NOT DETECTED
COCAINE METABOLITE, UR ~~LOC~~: POSITIVE — AB
Cannabinoid 50 Ng, Ur ~~LOC~~: NOT DETECTED
MDMA (ECSTASY) UR SCREEN: NOT DETECTED
METHADONE SCREEN, URINE: NOT DETECTED
OPIATE, UR SCREEN: NOT DETECTED
PHENCYCLIDINE (PCP) UR S: NOT DETECTED
Tricyclic, Ur Screen: NOT DETECTED

## 2016-11-25 LAB — CBC
HEMATOCRIT: 36.8 % — AB (ref 40.0–52.0)
Hemoglobin: 12.5 g/dL — ABNORMAL LOW (ref 13.0–18.0)
MCH: 30.1 pg (ref 26.0–34.0)
MCHC: 33.9 g/dL (ref 32.0–36.0)
MCV: 88.8 fL (ref 80.0–100.0)
Platelets: 220 10*3/uL (ref 150–440)
RBC: 4.15 MIL/uL — ABNORMAL LOW (ref 4.40–5.90)
RDW: 15.2 % — AB (ref 11.5–14.5)
WBC: 7.2 10*3/uL (ref 3.8–10.6)

## 2016-11-25 LAB — CK: CK TOTAL: 1313 U/L — AB (ref 49–397)

## 2016-11-25 LAB — ECHOCARDIOGRAM COMPLETE
HEIGHTINCHES: 68 in
Weight: 3680 oz

## 2016-11-25 MED ORDER — SODIUM CHLORIDE 0.9 % IV SOLN
INTRAVENOUS | Status: DC
  Administered 2016-11-25: 13:00:00 via INTRAVENOUS

## 2016-11-25 MED ORDER — PERFLUTREN LIPID MICROSPHERE
1.0000 mL | INTRAVENOUS | Status: AC | PRN
Start: 2016-11-25 — End: 2016-11-25
  Administered 2016-11-25: 2 mL via INTRAVENOUS
  Filled 2016-11-25: qty 10

## 2016-11-25 MED ORDER — BENZOCAINE 20 % MT PSTE
PASTE | Freq: Four times a day (QID) | OROMUCOSAL | Status: DC | PRN
  Filled 2016-11-25: qty 11.9

## 2016-11-25 MED ORDER — SODIUM CHLORIDE 0.9 % IV SOLN: INTRAVENOUS | Status: DC

## 2016-11-25 MED ORDER — OXYCODONE HCL 5 MG PO TABS
10.0000 mg | ORAL_TABLET | ORAL | Status: DC | PRN
  Administered 2016-11-25 – 2016-11-26 (×2): 10 mg via ORAL
  Filled 2016-11-25 (×3): qty 2

## 2016-11-25 NOTE — Progress Notes (Signed)
Chaplain responded to an OR for AD. Juan Harper met with pt, who appeared distraught, complaining of pain, was aggressive and uncooperative with CH. Pt did not want [place] to talk to him a bout AD, and he declined having AD education. [place] notified pt's nurse about the pt's uncooperative behavior.    11/25/16 1100  Clinical Encounter Type  Visited With Patient  Visit Type Initial  Referral From Nurse  Consult/Referral To Chaplain  Spiritual Encounters  Spiritual Needs Literature  Stress Factors  Patient Stress Factors Major life changes  Family Stress Factors Health changes

## 2016-11-25 NOTE — Progress Notes (Signed)
[facility] - [place] [facility]   PATIENT NAME: Juan InchesHenry Goffe    MR#:  [id number]  DATE OF BIRTH:  10/09/1960  SUBJECTIVE:  CHIEF COMPLAINT:   Chief Complaint  Patient presents with  . Fall   Has chronic neck pain which is unchanged. Patient very restless and unhappy with his stay.  As an as I walked in patient was aggressive and questioning why he did not get an x-ray of his chest and neck and head. On opening up and showing him images office x-rays and CAT scans patient denies that he had the tests done in the emergency room. He tells me he has seen many of his x-rays and images here that I showed him are not his.  REVIEW OF SYSTEMS:    Review of Systems  Constitutional: Positive for malaise/fatigue. Negative for chills and fever.  HENT: Negative for sore throat.   Eyes: Negative for blurred vision, double vision and pain.  Respiratory: Negative for cough, hemoptysis, shortness of breath and wheezing.   Cardiovascular: Negative for chest pain, palpitations, orthopnea and leg swelling.  Gastrointestinal: Negative for abdominal pain, constipation, diarrhea, heartburn, nausea and vomiting.  Genitourinary: Negative for dysuria and hematuria.  Musculoskeletal: Positive for neck pain. Negative for back pain and joint pain.  Skin: Negative for rash.  Neurological: Positive for weakness. Negative for sensory change, speech change, focal weakness and headaches.  Endo/Heme/Allergies: Does not bruise/bleed easily.  Psychiatric/Behavioral: Negative for depression. The patient is not nervous/anxious.     DRUG ALLERGIES:  No Known Allergies  VITALS:  Blood pressure 111/60, pulse 88, temperature 98.5 F (36.9 C), temperature source Oral, resp. rate 18, height 5\' 8"  (1.727 m), weight 104.3 kg (230 lb), SpO2 98 %.  PHYSICAL EXAMINATION:   Physical Exam  GENERAL:  56 y.o.-year-old patient lying in the bed with no acute distress.  EYES: Pupils equal, round, reactive to  light and accommodation. No scleral icterus. Extraocular muscles intact.  HEENT: Head atraumatic, normocephalic. Oropharynx and nasopharynx clear. Has a neck brace NECK:  Supple, no jugular venous distention. No thyroid enlargement, no tenderness.  LUNGS: Normal breath sounds bilaterally, no wheezing, rales, rhonchi. No use of accessory muscles of respiration.  CARDIOVASCULAR: S1, S2 normal. No murmurs, rubs, or gallops.  ABDOMEN: Soft, nontender, nondistended. Bowel sounds present. No organomegaly or mass.  EXTREMITIES: No cyanosis, clubbing or edema b/l.    NEUROLOGIC: Cranial nerves II through XII are intact. No focal Motor or sensory deficits b/l. PSYCHIATRIC: The patient is Alert and awake. Restless and anxious. SKIN: No obvious rash, lesion, or ulcer.   LABORATORY PANEL:   CBC  Recent Labs Lab 11/25/16 0633  WBC 7.2  HGB 12.5*  HCT 36.8*  PLT 220   ------------------------------------------------------------------------------------------------------------------ Chemistries   Recent Labs Lab 11/24/16 1934 11/25/16 0633  NA 136 137  K 4.3 4.2  CL 100* 103  CO2 22 25  GLUCOSE 126* 167*  BUN 38* 43*  CREATININE 4.90* 4.31*  CALCIUM 9.3 8.3*  AST 52*  --   ALT 24  --   ALKPHOS 59  --   BILITOT 0.8  --    ------------------------------------------------------------------------------------------------------------------  Cardiac Enzymes  Recent Labs Lab 11/24/16 1934  TROPONINI <0.03   ------------------------------------------------------------------------------------------------------------------  RADIOLOGY:  Dg Chest 2 View  Result Date: 11/24/2016 CLINICAL DATA:  Body aches following fall. EXAM: CHEST  2 VIEW COMPARISON:  10/31/2015 chest radiograph FINDINGS: Upper limits normal heart size and mild peribronchial thickening again noted. There is no evidence  of focal airspace disease, pulmonary edema, suspicious pulmonary nodule/mass, pleural effusion, or  pneumothorax. No acute bony abnormalities are identified. Remote left rib fractures are present. IMPRESSION: No evidence of acute cardiopulmonary disease. Electronically Signed   By: Harmon Pier M.D.   On: 11/24/2016 18:23   Ct Head Wo Contrast  Result Date: 11/24/2016 CLINICAL DATA:  Status post fall last night with head and neck pain. Patient has a history of prior neck surgery at [facility]. EXAM: CT HEAD WITHOUT CONTRAST CT CERVICAL SPINE WITHOUT CONTRAST TECHNIQUE: Multidetector CT imaging of the head and cervical spine was performed following the standard protocol without intravenous contrast. Multiplanar CT image reconstructions of the cervical spine were also generated. COMPARISON:  None. FINDINGS: CT HEAD FINDINGS Brain: No evidence of acute infarction, hemorrhage, hydrocephalus, extra-axial collection or mass lesion/mass effect. Vascular: No hyperdense vessel or unexpected calcification. Skull: Normal. Negative for fracture or focal lesion. Sinuses/Orbits: No acute finding. Other: None. CT CERVICAL SPINE FINDINGS Alignment: There is straightening of cervical spine. Skull base and vertebrae: The patient status post prior anterior fusion of C3 through C5 without malalignment. There are degenerative joint changes of the lower cervical spine with narrowed joint space and osteophyte formation. Soft tissues and spinal canal: Unremarkable on this noncontrast exam. Disc levels: Degenerative joint changes with narrowed disc space and osteophyte formation are identified in the lower cervical spine. Upper chest: Unremarkable. Other: Negative. IMPRESSION: No focal acute intracranial abnormality identified. No acute fracture or dislocation of cervical spine. Postsurgical changes of C3 through C5 without malalignment. Electronically Signed   By: Sherian Rein M.D.   On: 11/24/2016 18:10   Ct Cervical Spine Wo Contrast  Result Date: 11/24/2016 CLINICAL DATA:  Status post fall last night with head and neck pain. Patient  has a history of prior neck surgery at [facility]. EXAM: CT HEAD WITHOUT CONTRAST CT CERVICAL SPINE WITHOUT CONTRAST TECHNIQUE: Multidetector CT imaging of the head and cervical spine was performed following the standard protocol without intravenous contrast. Multiplanar CT image reconstructions of the cervical spine were also generated. COMPARISON:  None. FINDINGS: CT HEAD FINDINGS Brain: No evidence of acute infarction, hemorrhage, hydrocephalus, extra-axial collection or mass lesion/mass effect. Vascular: No hyperdense vessel or unexpected calcification. Skull: Normal. Negative for fracture or focal lesion. Sinuses/Orbits: No acute finding. Other: None. CT CERVICAL SPINE FINDINGS Alignment: There is straightening of cervical spine. Skull base and vertebrae: The patient status post prior anterior fusion of C3 through C5 without malalignment. There are degenerative joint changes of the lower cervical spine with narrowed joint space and osteophyte formation. Soft tissues and spinal canal: Unremarkable on this noncontrast exam. Disc levels: Degenerative joint changes with narrowed disc space and osteophyte formation are identified in the lower cervical spine. Upper chest: Unremarkable. Other: Negative. IMPRESSION: No focal acute intracranial abnormality identified. No acute fracture or dislocation of cervical spine. Postsurgical changes of C3 through C5 without malalignment. Electronically Signed   By: Sherian Rein M.D.   On: 11/24/2016 18:10   US Renal  Result Date: 11/25/2016 CLINICAL DATA:  56 year old hypertensive diabetic male with acute kidney insufficiency. Initial encounter. EXAM: RENAL / URINARY TRACT ULTRASOUND COMPLETE COMPARISON:  11/29/2015 abdominal sonogram. FINDINGS: Right Kidney: Length: 11.7 cm. Echogenicity within normal limits. No mass or hydronephrosis visualized. Left Kidney: Length: 12.6 cm. No hydronephrosis. Question tiny nonobstructing renal calculi versus vascular calcifications Bladder:  Appears normal for degree of bladder distention. IMPRESSION: No hydronephrosis. Question nonobstructing left renal calculi versus vascular calcifications. Electronically Signed   By: Viviann Spare  Constance Goltz M.D.   On: 11/25/2016 11:14     ASSESSMENT AND PLAN:   *  Acute on chronic renal failure - Likely due to dehydration and some rhabdomyolysis Start IV fluids. Ordered a renal ultrasound. [facility] consult nephrology. Discussed with Dr. Wynelle Link. Monitor input and output closely. Repeat CK level and BUN and creatinine in the morning.  * Paranoia Patient has been aggressive with staff. Some psychosis. We will consult psychiatry.   * Postural dizziness with presyncope Due to dehydration and hypotension   * Diabetes sliding scale insulin with corresponding glucose checks   * HTN hold home meds for now as the patient is borderline hypotensive  * Cocaine abuse.   * Sleep apnea - CPAP daily at bedtime  All the records are reviewed and case discussed with Care Management/Social Workerr. Management plans discussed with the patient, family and they are in agreement.  CODE STATUS: FULL CODE  DVT Prophylaxis: SCDs  TOTAL TIME TAKING CARE OF THIS PATIENT: 35 minutes.   POSSIBLE D/C IN 2-3 DAYS, DEPENDING ON CLINICAL CONDITION.  Milagros Loll R M.D on 11/25/2016 at 12:17 PM  Between 7am to 6pm - Pager - [phone]  After 6pm go to [email or web address] - password [email or web address]  [facility]  Office  [phone]  CC: Primary care physician; System, Pcp Not In  Note: This dictation was prepared with Dragon dictation along with smaller phrase technology. Any transcriptional errors that result from this process are unintentional.

## 2016-11-25 NOTE — Progress Notes (Signed)
Pt not going to wear Cpap per RN Crystal who is at bedside with pt at this time.

## 2016-11-25 NOTE — Care Management (Signed)
Patient admitted with AKI. Recent history of neck surgery and fall at home.  RNCM consult placed for home health needs.  RNCM attempted to assessed patient.  Patient states that eh has a home health aide 2.5 hours a day.  Patient states that he does not want to discuss things with me further until his other concerns are addressed.  Patient requests to speak to Department leadership.  [place] notified.  Patient with history of cocaine abuse, and was positive on admission.  Pharmacy [facility].  Home delivery of medication was arranged on previous presentation.

## 2016-11-25 NOTE — Progress Notes (Signed)
*  PRELIMINARY RESULTS* Echocardiogram 2D Echocardiogram has been performed.  Definity given for LV enhancement- without adverse pt reaction.  Cristela BlueHege, Jaiana Sheffer 11/25/2016, 3:25 PM

## 2016-11-25 NOTE — Progress Notes (Signed)
Patient complaining of his stay here and [facility]. Patient continually questioning of medications given, even after provided and explained to patient. For example, patient was given medline mouthrinse, as ordered. Patient finished lunch and upon rechecking on patient stated "what is that in the cup, I don't take liquid medicine, you trying to kill somebody." Reassured patient that the liquid in the medicine cup was mouthrinse. Patient did rinse mouth. Patients very agitated.

## 2016-11-25 NOTE — Consult Note (Signed)
[facility] Consult   Reason for Consult:  Consult for 56 year old man who came into the hospital after a fall and has been found to have some renal failure Referring Physician:  Verdell Carmine Patient Identification: Juan Harper MRN:  [id number] Principal Diagnosis: Adjustment disorder with mixed disturbance of emotions and conduct Diagnosis:   Patient Active Problem List   Diagnosis Date Noted  . Adjustment disorder with mixed disturbance of emotions and conduct [F43.25] 11/25/2016  . Sleep apnea [G47.30] 11/24/2016  . Diabetes ([place]) [E11.9] 11/24/2016  . HTN (hypertension) [I10] 11/24/2016  . Postural dizziness with presyncope [R42, R55] 11/24/2016  . Acute on chronic renal failure ([place]) [N17.9, N18.9] 09/19/2016    Total Time spent with patient: 1 hour  Subjective:   Juan Harper is a 56 y.o. male patient admitted with "you are damn right that I am angry".  HPI:  Patient interviewed chart reviewed. This is a 56 year old man admitted through the emergency room because of a fall and then found to have renal problems. Patient had been noticed by several providers today to be unusually angry and irritable. The question of psychosis arose when he seemed to be almost paranoid regarding his medicine. On interview this evening a pound the patient to be awake and alert and cooperative. He tells me that he is quite aware that he was angry with staff earlier. He says he feels this is justified because of 2 things. First of all he does not feel that his pain problem is being addressed correctly. Second of all he feels like people are not telling him enough about what kind of treatment he is getting and are not treating him with respect. Apparently the nursing supervisor was called to come by earlier and spent some time explaining his care to him and he feels like that has improved the situation. He still is concerned about his pain. Patient tells me that he fell down in his bathroom and  struck his face and head on the side of the bathtub. He shattered his dentures. He is having pain from the top of his head down through his neck and shoulder and pain in his mouth. He was taking 10 mg of oxycodone every 4-6 hours as needed at home and is angry that he is getting less of it here. Patient denies being depressed. Denies suicidal ideation. Denies any hallucinations. Does not appear to be paranoid or psychotic in his thinking right now.  Social history: Lives by himself. He has a visitor with him this evening who is a male friend who is also concerned about him.  Medical history: History of hypertension. Recent surgery on his neck and back that have left him in a head brace with chronic pain already.  Substance abuse history: This is the second time in a few months that he has presented to the emergency room with a drug screen positive for cocaine. Drug abuse was never listed as a problem in his previous notes and doesn't appear to of ever been addressed. Patient admits to it but does not see it as being a major problem. He insists that he had not been misusing his pain medicine. I did confirm that he had received prescriptions for fairly large doses of pain medicine recently.  Past Psychiatric History: [facility] treatment that he recalls. Denies history of depression. Denies psychosis. Denies any history of suicidal behavior or psychiatric hospitalization. Psychiatric conditions had never been brought up as a problem in the past  Risk to  Self: Is patient at risk for suicide?: No Risk to Others:   Prior Inpatient Therapy:   Prior Outpatient Therapy:    Past Medical History:  Past Medical History:  Diagnosis Date  . Arthritis    NECK/ RIGHT KNEE  . Bell's palsy   . BPH (benign prostatic hyperplasia)   . Cough    chronic  . Diabetes mellitus without complication ([place])   . Edema    legs/feet  . 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 Hypertension   .  Orthopnea   . Shortness of breath dyspnea   . Sleep apnea    cpap  . Wheezing     Past Surgical History:  Procedure Laterality Date  . BACK SURGERY    . CATARACT EXTRACTION W/PHACO Right 07/31/2016   Procedure: CATARACT EXTRACTION PHACO AND INTRAOCULAR LENS PLACEMENT (IOC);  Surgeon: Eulogio Bear, MD;  Location: [facility] ORS;  Service: Ophthalmology;  Laterality: Right;  Lot # [id number] H US:00:26.9 AP%:6.8% CDE: 1.83  . COLONOSCOPY    . NECK MASS EXCISION    . PILONIDAL CYST EXCISION     Family History: No family history on file. Family Psychiatric  History: Denies there being any Social History:  History  Alcohol Use No     History  Drug Use  . Types: Cocaine    Social History   Social History  . Marital status: Single    Spouse name: N/A  . Number of children: N/A  . Years of education: N/A   Social History Main Topics  . Smoking status: Current Every Day Smoker    Packs/day: 0.50    Types: Cigarettes  . Smokeless tobacco: Never Used  . Alcohol use No  . Drug use: Yes    Types: Cocaine  . Sexual activity: Not Asked   Other Topics Concern  . None   Social History Narrative  . None   Additional Social History:    Allergies:  No Known Allergies  Labs:  Results for orders placed or performed during the hospital encounter of 11/24/16 (from the past 48 hour(s))  Comprehensive metabolic panel     Status: Abnormal   Collection Time: 11/24/16  7:34 PM  Result Value Ref Range   Sodium 136 135 - 145 mmol/L   Potassium 4.3 3.5 - 5.1 mmol/L   Chloride 100 (L) 101 - 111 mmol/L   CO2 22 22 - 32 mmol/L   Glucose, Bld 126 (H) 65 - 99 mg/dL   BUN 38 (H) 6 - 20 mg/dL   Creatinine, Ser 4.90 (H) 0.61 - 1.24 mg/dL   Calcium 9.3 8.9 - 10.3 mg/dL   Total Protein 8.0 6.5 - 8.1 g/dL   Albumin 3.8 3.5 - 5.0 g/dL   AST 52 (H) 15 - 41 U/L   ALT 24 17 - 63 U/L   Alkaline Phosphatase 59 38 - 126 U/L   Total Bilirubin 0.8 0.3 - 1.2 mg/dL   GFR calc non Af Amer 12 (L) >60  mL/min   GFR calc Af Amer 14 (L) >60 mL/min    Comment: (NOTE) The eGFR has been calculated using the CKD EPI equation. This calculation has not been validated in all clinical situations. eGFR's persistently <60 mL/min signify possible Chronic Kidney Disease.    Anion gap 14 5 - 15  Troponin I     Status: None   Collection Time: 11/24/16  7:34 PM  Result Value Ref Range   Troponin  I <0.03 <0.03 ng/mL  Lactic acid, plasma     Status: Abnormal   Collection Time: 11/24/16  7:34 PM  Result Value Ref Range   Lactic Acid, Venous 2.6 (HH) 0.5 - 1.9 mmol/L    Comment: CRITICAL RESULT CALLED TO, READ BACK BY AND VERIFIED WITH KAILEY WALKER 11/24/16 @ 2017  [place]   CBC with Differential     Status: Abnormal   Collection Time: 11/24/16  7:34 PM  Result Value Ref Range   WBC 8.2 3.8 - 10.6 K/uL   RBC 4.67 4.40 - 5.90 MIL/uL   Hemoglobin 13.8 13.0 - 18.0 g/dL   HCT 40.9 40.0 - 52.0 %   MCV 87.5 80.0 - 100.0 fL   MCH 29.6 26.0 - 34.0 pg   MCHC 33.8 32.0 - 36.0 g/dL   RDW 15.4 (H) 11.5 - 14.5 %   Platelets 275 150 - 440 K/uL   Neutrophils Relative % 58 %   Neutro Abs 4.7 1.4 - 6.5 K/uL   Lymphocytes Relative 22 %   Lymphs Abs 1.8 1.0 - 3.6 K/uL   Monocytes Relative 18 %   Monocytes Absolute 1.5 (H) 0.2 - 1.0 K/uL   Eosinophils Relative 1 %   Eosinophils Absolute 0.1 0 - 0.7 K/uL   Basophils Relative 1 %   Basophils Absolute 0.1 0 - 0.1 K/uL  Glucose, capillary     Status: Abnormal   Collection Time: 11/24/16 10:28 PM  Result Value Ref Range   Glucose-Capillary 158 (H) 65 - 99 mg/dL  Lactic acid, plasma     Status: Abnormal   Collection Time: 11/24/16 11:03 PM  Result Value Ref Range   Lactic Acid, Venous 2.2 (HH) 0.5 - 1.9 mmol/L    Comment: CRITICAL RESULT CALLED TO, READ BACK BY AND VERIFIED WITH Jacqulynn Cadet RN AT 2355 11/24/16 MSS.   Glucose, capillary     Status: Abnormal   Collection Time: 11/24/16 11:18 PM  Result Value Ref Range   Glucose-Capillary 150 (H) 65 - 99 mg/dL    Comment 1 QC Due   Basic metabolic panel     Status: Abnormal   Collection Time: 11/25/16  6:33 AM  Result Value Ref Range   Sodium 137 135 - 145 mmol/L   Potassium 4.2 3.5 - 5.1 mmol/L   Chloride 103 101 - 111 mmol/L   CO2 25 22 - 32 mmol/L   Glucose, Bld 167 (H) 65 - 99 mg/dL   BUN 43 (H) 6 - 20 mg/dL   Creatinine, Ser 4.31 (H) 0.61 - 1.24 mg/dL   Calcium 8.3 (L) 8.9 - 10.3 mg/dL   GFR calc non Af Amer 14 (L) >60 mL/min   GFR calc Af Amer 16 (L) >60 mL/min    Comment: (NOTE) The eGFR has been calculated using the CKD EPI equation. This calculation has not been validated in all clinical situations. eGFR's persistently <60 mL/min signify possible Chronic Kidney Disease.    Anion gap 9 5 - 15  CBC     Status: Abnormal   Collection Time: 11/25/16  6:33 AM  Result Value Ref Range   WBC 7.2 3.8 - 10.6 K/uL   RBC 4.15 (L) 4.40 - 5.90 MIL/uL   Hemoglobin 12.5 (L) 13.0 - 18.0 g/dL   HCT 36.8 (L) 40.0 - 52.0 %   MCV 88.8 80.0 - 100.0 fL   MCH 30.1 26.0 - 34.0 pg   MCHC 33.9 32.0 - 36.0 g/dL   RDW 15.2 (H) 11.5 -  14.5 %   Platelets 220 150 - 440 K/uL  CK     Status: Abnormal   Collection Time: 11/25/16  6:33 AM  Result Value Ref Range   Total CK 1,313 (H) 49 - 397 U/L  Glucose, capillary     Status: Abnormal   Collection Time: 11/25/16  7:52 AM  Result Value Ref Range   Glucose-Capillary 132 (H) 65 - 99 mg/dL   Comment 1 Notify RN    Comment 2 Document in Chart   Urinalysis, Complete w Microscopic     Status: Abnormal   Collection Time: 11/25/16  8:19 AM  Result Value Ref Range   Color, Urine YELLOW (A) YELLOW   APPearance HAZY (A) CLEAR   Specific Gravity, Urine 1.019 1.005 - 1.030   pH 5.0 5.0 - 8.0   Glucose, UA NEGATIVE NEGATIVE mg/dL   Hgb urine dipstick NEGATIVE NEGATIVE   Bilirubin Urine NEGATIVE NEGATIVE   Ketones, ur NEGATIVE NEGATIVE mg/dL   Protein, ur 30 (A) NEGATIVE mg/dL   Nitrite NEGATIVE NEGATIVE   Leukocytes, UA NEGATIVE NEGATIVE   RBC / HPF 0-5 0 - 5  RBC/hpf   WBC, UA 0-5 0 - 5 WBC/hpf   Bacteria, UA NONE SEEN NONE SEEN   Squamous Epithelial / LPF 0-5 (A) NONE SEEN   Mucous PRESENT    Hyaline Casts, UA PRESENT   Urine Drug Screen, Qualitative ([facility] only)     Status: Abnormal   Collection Time: 11/25/16  8:19 AM  Result Value Ref Range   Tricyclic, Ur Screen NONE DETECTED NONE DETECTED   Amphetamines, Ur Screen NONE DETECTED NONE DETECTED   MDMA (Ecstasy)Ur Screen NONE DETECTED NONE DETECTED   Cocaine Metabolite,Ur [place] POSITIVE (A) NONE DETECTED   Opiate, Ur Screen NONE DETECTED NONE DETECTED   Phencyclidine (PCP) Ur S NONE DETECTED NONE DETECTED   Cannabinoid 50 Ng, Ur [place] NONE DETECTED NONE DETECTED   Barbiturates, Ur Screen NONE DETECTED NONE DETECTED   Benzodiazepine, Ur Scrn NONE DETECTED NONE DETECTED   Methadone Scn, Ur NONE DETECTED NONE DETECTED    Comment: (NOTE) 826  Tricyclics, urine               Cutoff 1000 ng/mL 200  Amphetamines, urine             Cutoff 1000 ng/mL 300  MDMA (Ecstasy), urine           Cutoff 500 ng/mL 400  Cocaine Metabolite, urine       Cutoff 300 ng/mL 500  Opiate, urine                   Cutoff 300 ng/mL 600  Phencyclidine (PCP), urine      Cutoff 25 ng/mL 700  Cannabinoid, urine              Cutoff 50 ng/mL 800  Barbiturates, urine             Cutoff 200 ng/mL 900  Benzodiazepine, urine           Cutoff 200 ng/mL 1000 Methadone, urine                Cutoff 300 ng/mL 1100 1200 The urine drug screen provides only a preliminary, unconfirmed 1300 analytical test result and should not be used for non-medical 1400 purposes. Clinical consideration and professional judgment should 1500 be applied to any positive drug screen result due to possible 1600 interfering substances. A more specific alternate chemical method 1700 must  be used in order to obtain a confirmed analytical result.  1800 Gas chromato graphy / mass spectrometry (GC/MS) is the preferred 1900 confirmatory method.   Glucose,  capillary     Status: Abnormal   Collection Time: 11/25/16 12:43 PM  Result Value Ref Range   Glucose-Capillary 157 (H) 65 - 99 mg/dL  Glucose, capillary     Status: Abnormal   Collection Time: 11/25/16  5:15 PM  Result Value Ref Range   Glucose-Capillary 194 (H) 65 - 99 mg/dL   Comment 1 Notify RN    Comment 2 Document in Chart     Current Facility-Administered Medications  Medication Dose Route Frequency Provider Last Rate Last Dose  . 0.9 %  sodium chloride infusion   Intravenous Continuous Hillary Bow, MD 100 mL/hr at 11/25/16 1308    . acetaminophen (TYLENOL) tablet 650 mg  650 mg Oral Q6H PRN Lance Coon, MD       Or  . acetaminophen (TYLENOL) suppository 650 mg  650 mg Rectal Q6H PRN Lance Coon, MD      . aspirin EC tablet 81 mg  81 mg Oral Daily Lance Coon, MD   81 mg at 11/25/16 0906  . benzocaine (ORABASE-B) 20 % paste   Mouth/Throat QID PRN Clapacs, John T, MD      . gabapentin (NEURONTIN) capsule 600 mg  600 mg Oral TID Lance Coon, MD   600 mg at 11/25/16 1626  . heparin injection 5,000 Units  5,000 Units Subcutaneous Camelia Phenes Lance Coon, MD   5,000 Units at 11/25/16 0555  . insulin aspart (novoLOG) injection 0-5 Units  0-5 Units Subcutaneous QHS Lance Coon, MD      . insulin aspart (novoLOG) injection 0-9 Units  0-9 Units Subcutaneous TID WC Lance Coon, MD   2 Units at 11/25/16 1307  . MEDLINE mouth rinse  15 mL Mouth Rinse BID Lance Coon, MD   15 mL at 11/25/16 1159  . ondansetron (ZOFRAN) tablet 4 mg  4 mg Oral Q6H PRN Lance Coon, MD       Or  . ondansetron [facility]) injection 4 mg  4 mg Intravenous Q6H PRN Lance Coon, MD      . oxyCODONE (Oxy IR/ROXICODONE) immediate release tablet 10 mg  10 mg Oral Q4H PRN Clapacs, John T, MD      . pravastatin (PRAVACHOL) tablet 20 mg  20 mg Oral q1800 Lance Coon, MD      . tamsulosin [facility]) capsule 0.4 mg  0.4 mg Oral Daily Lance Coon, MD   0.4 mg at 11/25/16 5859  . tiotropium (SPIRIVA) inhalation  capsule 18 mcg  18 mcg Inhalation Daily Lance Coon, MD   18 mcg at 11/25/16 1158    Musculoskeletal: Strength & Muscle Tone: within normal limits Gait & Station: normal Patient leans: Backward  Psychiatric Specialty Exam: Physical Exam  Constitutional: He appears well-developed and well-nourished.  HENT:  Head: Normocephalic and atraumatic.  Eyes: Conjunctivae are normal. Pupils are equal, round, and reactive to light.  Neck: Normal range of motion.  Cardiovascular: Normal heart sounds.   Respiratory: Effort normal.  GI: Soft.  Musculoskeletal: Normal range of motion.  Neurological: He is alert.  Skin: Skin is warm and dry.  Psychiatric: His speech is normal and behavior is normal. His affect is angry. Thought content is not paranoid. He expresses impulsivity. He expresses no homicidal and no suicidal ideation. He exhibits abnormal recent memory.    Review of Systems  Constitutional: Negative.  HENT: Negative.        Patient reports that he broke his dentures during the fall at home and is having significant pain in his mouth  Eyes: Negative.   Respiratory: Negative.   Cardiovascular: Negative.   Gastrointestinal: Negative.   Musculoskeletal: Positive for back pain and neck pain.  Skin: Negative.   Neurological: Positive for headaches.  Psychiatric/Behavioral: Positive for substance abuse. Negative for depression, hallucinations, memory loss and suicidal ideas. The patient is not nervous/anxious and does not have insomnia.     Blood pressure 119/70, pulse 95, temperature 98.5 F (36.9 C), temperature source Oral, resp. rate 20, height 5' 8" (1.727 m), weight 230 lb (104.3 kg), SpO2 100 %.Body mass index is 34.97 kg/m.  General Appearance: Fairly Groomed  Eye Contact:  Fair  Speech:  Clear and Coherent  Volume:  Normal  Mood:  Irritable  Affect:  Congruent  Thought Process:  Goal Directed  Orientation:  Full (Time, Place, and Person)  Thought Content:  Rumination   Suicidal Thoughts:  No  Homicidal Thoughts:  No  Memory:  Immediate;   Fair Recent;   Fair Remote;   Fair  Judgement:  Fair  Insight:  Fair  Psychomotor Activity:  Normal  Concentration:  Concentration: Fair  Recall:  [employer] of Knowledge:  Fair  Language:  Fair  Akathisia:  No  Handed:  Right  AIMS (if indicated):     Assets:  Desire for Improvement Housing Resilience Social Support  ADL's:  Intact  Cognition:  WNL  Sleep:        Treatment Plan Summary: Daily contact with patient to assess and evaluate symptoms and progress in treatment, Medication management and Plan 56 year old man who came into the hospital for a fall but was admitted more for the hypotension and renal failure. Patient professed that he really didn't understand any of that and was upset that no one seem to be paying attention to his neck pain. The pain seems to be the most acute issue that is upsetting him now. I have agreed to increase his pain medication dosing in the hospital to 10 mg every 4 hours as needed at least temporarily which I think will rob a bubbly make him much more tractable and less irritable. I do see that he had a cocaine positive drug screen which suggests that substance abuse may be more of an ongoing issue. Trying to address that right this moment is certainly just going to make him more angry and uncooperative. We will try and address that again before discharge. Patient is not psychotic and does not need any other specific psychiatric treatment. He says that he is satisfied with the plan. I have also ordered some oral topical anesthetic for his mouth which is something he requested.  Disposition: Patient does not meet criteria for psychiatric inpatient admission. Supportive therapy provided about ongoing stressors.  Alethia Berthold, MD 11/25/2016 6:33 PM

## 2016-11-25 NOTE — Consult Note (Signed)
[facility]  CONSULT NOTE    Date: 11/25/2016                  Patient Name:  Juan Harper  MRN: [id number]  DOB: 04/28/1961  Age / Sex: 56 y.o., male         PCP: System, Pcp Not In                 Service Requesting Consult: Dr. Elpidio Anis                 Reason for Consult: Acute Renal Failure            History of Present Illness: [name] is a 56 y.o. black male with diabetes mellitus type II, hypertension, obstructive sleep apnea, COPD, who was admitted to [facility] on 11/24/2016 for Idiopathic hypotension [I95.0] Fall, initial encounter [W19.XXXA]  Patient seems to be confused and upset. Patient is concerned about his pain control.   Patient fell in his bathroom. Patient was found to have hypotension and acute renal failure with creatinine of 4.9. Patient's baseline creatinine of 1.13 from 4/29.   Found to have cocaine in urine drug screen.    Medications: Outpatient medications: Prescriptions Prior to Admission  Medication Sig Dispense Refill Last Dose  . aspirin EC 81 MG tablet Take 81 mg by mouth daily.   Past Week at Unknown time  . gabapentin (NEURONTIN) 300 MG capsule Take 600 mg by mouth 3 (three) times daily.    Past Week at Unknown time  . glipiZIDE (GLUCOTROL) 5 MG tablet Take 1 tablet (5 mg total) by mouth 2 (two) times daily before a meal. 20 tablet 0 Past Week at Unknown time  . ipratropium (ATROVENT HFA) 17 MCG/ACT inhaler Inhale into the lungs 2 (two) times daily.   Past Week at Unknown time  . lisinopril-hydrochlorothiazide (PRINZIDE,ZESTORETIC) 20-25 MG tablet Take 1 tablet by mouth daily.   Past Week at Unknown time  . loratadine (CLARITIN) 10 MG tablet Take 10 mg by mouth daily.   Past Week at Unknown time  . lovastatin (MEVACOR) 20 MG tablet Take 20 mg by mouth at bedtime.   Past Week at Unknown time  . metFORMIN (GLUCOPHAGE) 500 MG tablet Take 2 tablets (1,000 mg total) by mouth 2 (two) times daily. 60 tablet 0 Past Week at  Unknown time  . sitaGLIPtin (JANUVIA) 100 MG tablet Take 100 mg by mouth daily.   Past Week at Unknown time  . tamsulosin (FLOMAX) 0.4 MG CAPS capsule Take 0.4 mg by mouth.   Past Week at Unknown time  . tiotropium (SPIRIVA) 18 MCG inhalation capsule Place 18 mcg into inhaler and inhale daily.   Past Week at Unknown time  . acetaminophen (TYLENOL) 325 MG tablet Take 650 mg by mouth 3 (three) times daily.   prn at prn  . albuterol (PROVENTIL HFA;VENTOLIN HFA) 108 (90 Base) MCG/ACT inhaler Inhale 2 puffs into the lungs every 6 (six) hours as needed for wheezing or shortness of breath.   prn at prn  . oxycodone (OXY-IR) 5 MG capsule Take 5 mg by mouth every 6 (six) hours as needed.   prn at prn    Current medications: Current Facility-Administered Medications  Medication Dose Route Frequency Provider Last Rate Last Dose  . 0.9 %  sodium chloride infusion   Intravenous Continuous Milagros Loll, MD 100 mL/hr at 11/25/16 1308    . acetaminophen (TYLENOL) tablet 650 mg  650  mg Oral Q6H PRN Oralia Manis, MD       Or  . acetaminophen (TYLENOL) suppository 650 mg  650 mg Rectal Q6H PRN Oralia Manis, MD      . aspirin EC tablet 81 mg  81 mg Oral Daily Oralia Manis, MD   81 mg at 11/25/16 0906  . gabapentin (NEURONTIN) capsule 600 mg  600 mg Oral TID Oralia Manis, MD   600 mg at 11/25/16 0906  . heparin injection 5,000 Units  5,000 Units Subcutaneous Willow Ora, MD   5,000 Units at 11/25/16 0555  . insulin aspart (novoLOG) injection 0-5 Units  0-5 Units Subcutaneous QHS Oralia Manis, MD      . insulin aspart (novoLOG) injection 0-9 Units  0-9 Units Subcutaneous TID WC Oralia Manis, MD   2 Units at 11/25/16 1307  . MEDLINE mouth rinse  15 mL Mouth Rinse BID Oralia Manis, MD   15 mL at 11/25/16 1159  . ondansetron (ZOFRAN) tablet 4 mg  4 mg Oral Q6H PRN Oralia Manis, MD       Or  . ondansetron [facility]) injection 4 mg  4 mg Intravenous Q6H PRN Oralia Manis, MD      . oxyCODONE (Oxy  IR/ROXICODONE) immediate release tablet 5 mg  5 mg Oral Q6H PRN Oralia Manis, MD   5 mg at 11/25/16 0906  . pravastatin (PRAVACHOL) tablet 20 mg  20 mg Oral q1800 Oralia Manis, MD      . tamsulosin [facility]) capsule 0.4 mg  0.4 mg Oral Daily Oralia Manis, MD   0.4 mg at 11/25/16 0906  . tiotropium (SPIRIVA) inhalation capsule 18 mcg  18 mcg Inhalation Daily Oralia Manis, MD   18 mcg at 11/25/16 1158      Allergies: No Known Allergies    Past Medical History: Past Medical History:  Diagnosis Date  . Arthritis    NECK/ RIGHT KNEE  . Bell's palsy   . BPH (benign prostatic hyperplasia)   . Cough    chronic  . Diabetes mellitus without complication (HCC)   . Edema    legs/feet  . 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 Hypertension   . Orthopnea   . Shortness of breath dyspnea   . Sleep apnea    cpap  . Wheezing      Past Surgical History: Past Surgical History:  Procedure Laterality Date  . BACK SURGERY    . CATARACT EXTRACTION W/PHACO Right 07/31/2016   Procedure: CATARACT EXTRACTION PHACO AND INTRAOCULAR LENS PLACEMENT (IOC);  Surgeon: Nevada Crane, MD;  Location: [facility] ORS;  Service: Ophthalmology;  Laterality: Right;  Lot # [id number] H US:00:26.9 AP%:6.8% CDE: 1.83  . COLONOSCOPY    . NECK MASS EXCISION    . PILONIDAL CYST EXCISION       Family History: No family history on file.   Social History: Social History   Social History  . Marital status: Single    Spouse name: N/A  . Number of children: N/A  . Years of education: N/A   Occupational History  . Not on file.   Social History Main Topics  . Smoking status: Current Every Day Smoker    Packs/day: 0.50    Types: Cigarettes  . Smokeless tobacco: Never Used  . Alcohol use No  . Drug use: Yes    Types: Cocaine  . Sexual activity: Not on file   Other Topics Concern  . Not on file  Social History Narrative  . No narrative on file     Review of Systems: Review of  Systems  Unable to perform ROS: Psychiatric disorder    Vital Signs: Blood pressure 119/70, pulse 95, temperature 98.5 F (36.9 C), temperature source Oral, resp. rate 20, height 5\' 8"  (1.727 m), weight 104.3 kg (230 lb), SpO2 100 %.  Weight trends: Filed Weights   11/24/16 1703  Weight: 104.3 kg (230 lb)    Physical Exam: General: NAD, laying in bed  Head: Normocephalic, atraumatic. Moist oral mucosal membranes  Eyes: Anicteric, PERRL  Neck: Cervical neck brace  Lungs:  Clear to auscultation  Heart: Regular rate and rhythm  Abdomen:  Soft, nontender,   Extremities:  no peripheral edema.  Neurologic: Nonfocal, moving all four extremities  Skin: No lesions        Lab results: Basic Metabolic Panel:  Recent Labs Lab 11/24/16 1934 11/25/16 0633  NA 136 137  K 4.3 4.2  CL 100* 103  CO2 22 25  GLUCOSE 126* 167*  BUN 38* 43*  CREATININE 4.90* 4.31*  CALCIUM 9.3 8.3*    Liver Function Tests:  Recent Labs Lab 11/24/16 1934  AST 52*  ALT 24  ALKPHOS 59  BILITOT 0.8  PROT 8.0  ALBUMIN 3.8   No results for input(s): LIPASE, AMYLASE in the last 168 hours. No results for input(s): AMMONIA in the last 168 hours.  CBC:  Recent Labs Lab 11/24/16 1934 11/25/16 0633  WBC 8.2 7.2  NEUTROABS 4.7  --   HGB 13.8 12.5*  HCT 40.9 36.8*  MCV 87.5 88.8  PLT 275 220    Cardiac Enzymes:  Recent Labs Lab 11/24/16 1934 11/25/16 0633  CKTOTAL  --  1,313*  TROPONINI <0.03  --     BNP: Invalid input(s): POCBNP  CBG:  Recent Labs Lab 11/24/16 2228 11/24/16 2318 11/25/16 0752 11/25/16 1243  GLUCAP 158* 150* 132* 157*    Microbiology: No results found for this or any previous visit.  Coagulation Studies: No results for input(s): LABPROT, INR in the last 72 hours.  Urinalysis:  Recent Labs  11/25/16 0819  COLORURINE YELLOW*  LABSPEC 1.019  PHURINE 5.0  GLUCOSEU NEGATIVE  HGBUR NEGATIVE  BILIRUBINUR NEGATIVE  KETONESUR NEGATIVE  PROTEINUR  30*  NITRITE NEGATIVE  LEUKOCYTESUR NEGATIVE      Imaging: Dg Chest 2 View  Result Date: 11/24/2016 CLINICAL DATA:  Body aches following fall. EXAM: CHEST  2 VIEW COMPARISON:  10/31/2015 chest radiograph FINDINGS: Upper limits normal heart size and mild peribronchial thickening again noted. There is no evidence of focal airspace disease, pulmonary edema, suspicious pulmonary nodule/mass, pleural effusion, or pneumothorax. No acute bony abnormalities are identified. Remote left rib fractures are present. IMPRESSION: No evidence of acute cardiopulmonary disease. Electronically Signed   By: Harmon Pier M.D.   On: 11/24/2016 18:23   Ct Head Wo Contrast  Result Date: 11/24/2016 CLINICAL DATA:  Status post fall last night with head and neck pain. Patient has a history of prior neck surgery at [facility]. EXAM: CT HEAD WITHOUT CONTRAST CT CERVICAL SPINE WITHOUT CONTRAST TECHNIQUE: Multidetector CT imaging of the head and cervical spine was performed following the standard protocol without intravenous contrast. Multiplanar CT image reconstructions of the cervical spine were also generated. COMPARISON:  None. FINDINGS: CT HEAD FINDINGS Brain: No evidence of acute infarction, hemorrhage, hydrocephalus, extra-axial collection or mass lesion/mass effect. Vascular: No hyperdense vessel or unexpected calcification. Skull: Normal. Negative for fracture or focal lesion. Sinuses/Orbits:  No acute finding. Other: None. CT CERVICAL SPINE FINDINGS Alignment: There is straightening of cervical spine. Skull base and vertebrae: The patient status post prior anterior fusion of C3 through C5 without malalignment. There are degenerative joint changes of the lower cervical spine with narrowed joint space and osteophyte formation. Soft tissues and spinal canal: Unremarkable on this noncontrast exam. Disc levels: Degenerative joint changes with narrowed disc space and osteophyte formation are identified in the lower cervical spine. Upper  chest: Unremarkable. Other: Negative. IMPRESSION: No focal acute intracranial abnormality identified. No acute fracture or dislocation of cervical spine. Postsurgical changes of C3 through C5 without malalignment. Electronically Signed   By: Sherian ReinWei-Chen  Lin M.D.   On: 11/24/2016 18:10   Ct Cervical Spine Wo Contrast  Result Date: 11/24/2016 CLINICAL DATA:  Status post fall last night with head and neck pain. Patient has a [facility] of [facility]. EXAM: CT HEAD WITHOUT CONTRAST CT CERVICAL SPINE WITHOUT CONTRAST TECHNIQUE: Multidetector CT imaging of the head and cervical spine was performed following the standard protocol without intravenous contrast. Multiplanar CT image reconstructions of the cervical spine were also generated. COMPARISON:  None. FINDINGS: CT HEAD FINDINGS Brain: No evidence of acute infarction, hemorrhage, hydrocephalus, extra-axial collection or mass lesion/mass effect. Vascular: No hyperdense vessel or unexpected calcification. Skull: Normal. Negative for fracture or focal lesion. Sinuses/Orbits: No acute finding. Other: None. CT CERVICAL SPINE FINDINGS Alignment: There is straightening of cervical spine. Skull base and vertebrae: The patient status post prior anterior fusion of C3 through C5 without malalignment. There are degenerative joint changes of the lower cervical spine with narrowed joint space and osteophyte formation. Soft tissues and spinal canal: Unremarkable on this noncontrast exam. Disc levels: Degenerative joint changes with narrowed disc space and osteophyte formation are identified in the lower cervical spine. Upper chest: Unremarkable. Other: Negative. IMPRESSION: No focal acute intracranial abnormality identified. No acute fracture or dislocation of cervical spine. Postsurgical changes of C3 through C5 without malalignment. Electronically Signed   By: Sherian ReinWei-Chen  Lin M.D.   On: 11/24/2016 18:10   [place] Renal  Result Date: 11/25/2016 CLINICAL DATA:  56 year old  hypertensive diabetic male with acute kidney insufficiency. Initial encounter. EXAM: RENAL / URINARY TRACT ULTRASOUND COMPLETE COMPARISON:  11/29/2015 abdominal sonogram. FINDINGS: Right Kidney: Length: 11.7 cm. Echogenicity within normal limits. No mass or hydronephrosis visualized. Left Kidney: Length: 12.6 cm. No hydronephrosis. Question tiny nonobstructing renal calculi versus vascular calcifications Bladder: Appears normal for degree of bladder distention. IMPRESSION: No hydronephrosis. Question nonobstructing left renal calculi versus vascular calcifications. Electronically Signed   By: Lacy DuverneySteven  Olson M.D.   On: 11/25/2016 11:14      Assessment & Plan: Mr. Janalyn RouseHenry E Sidell is a 56 y.o. black male with diabetes mellitus type II, hypertension, obstructive sleep apnea, COPD, who [facility]   1. Acute Renal Failure: baseline creatinine of 1.13 on 09/21/16. Acute renal failure in 08/2016 with recovery. CK level not too elevated. Working differential includes prerenal azotemia and/or ATN from hypotension and cocaine.  - Agree with IV fluids - Monitor urine output and volume status. Discussed dialysis with patient.   2. Hypotension: blood pressure has improved.  - holding lisinopril, hydrochlorothiazide - Continue tamsulosin  3. Diabetes mellitus type II with renal manifestations of proteinuria and glucosuria - hold metformin.     LOS: 1 Cora Brierley 7/3/20182:52 PM

## 2016-11-26 LAB — BASIC METABOLIC PANEL
ANION GAP: 5 (ref 5–15)
BUN: 23 mg/dL — ABNORMAL HIGH (ref 6–20)
CALCIUM: 8.7 mg/dL — AB (ref 8.9–10.3)
CHLORIDE: 106 mmol/L (ref 101–111)
CO2: 27 mmol/L (ref 22–32)
Creatinine, Ser: 1.19 mg/dL (ref 0.61–1.24)
GFR calc non Af Amer: >60 mL/min (ref >60)
Glucose, Bld: 138 mg/dL — ABNORMAL HIGH (ref 65–99)
POTASSIUM: 4.9 mmol/L (ref 3.5–5.1)
Sodium: 138 mmol/L (ref 135–145)

## 2016-11-26 LAB — CK: Total CK: 1079 U/L — ABNORMAL HIGH (ref 49–397)

## 2016-11-26 LAB — GLUCOSE, CAPILLARY
GLUCOSE-CAPILLARY: 149 mg/dL — AB (ref 65–99)
Glucose-Capillary: 126 mg/dL — ABNORMAL HIGH (ref 65–99)

## 2016-11-26 MED ORDER — BENZOCAINE 10 % MT GEL
Freq: Four times a day (QID) | OROMUCOSAL | Status: DC | PRN
  Filled 2016-11-26: qty 9

## 2016-11-26 NOTE — Discharge Summary (Signed)
[facility] - [place] at [facility]   PATIENT NAME: Juan Harper    MR#:  [id number]  DATE OF BIRTH:  [birth year]  DATE OF ADMISSION:  11/24/2016 ADMITTING PHYSICIAN: Oralia Manis, MD  DATE OF DISCHARGE: 11/26/16  PRIMARY CARE PHYSICIAN: System, Pcp Not In    ADMISSION DIAGNOSIS:  Idiopathic hypotension [I95.0] Fall, initial encounter [W19.XXXA]  DISCHARGE DIAGNOSIS:    SECONDARY DIAGNOSIS:   Past Medical History:  Diagnosis Date  . Arthritis    NECK/ RIGHT KNEE  . Bell's palsy   . BPH (benign prostatic hyperplasia)   . Cough    chronic  . Diabetes mellitus without complication (HCC)   . Edema    legs/feet  . 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 Hypertension   . Orthopnea   . Shortness of breath dyspnea   . Sleep apnea    cpap  . Wheezing     HOSPITAL COURSE:  Juan Harper  is a 56 y.o. male who presents with A fall in his bathroom. Patient refuses to contribute much information to his history of present illness. He states very simply that he fell in his bathroom last night, and has had significant pain since then. Here in the ED he was found to have acute on chronic renal failure, and low blood pressure.  *Acute on chronic renal failure - Likely due to dehydration and some rhabdomyolysis -Received aggressive IV fluids - Discussed with Dr. Wynelle Link. -Monitor input and output closely. Repeat CK level and BUN and creatinine in the morning. -came in with creat of 4.9---4.3---1.19  * Paranoia Patient has been aggressive with staff. Some psychosis. Consult with psychiatry appreciated. Nothing added  *Postural dizziness with presyncope Due to dehydration and hypotension -BP stable  *Diabetes sliding scale insulin with corresponding glucose checks -resumed home meds  *HTN Resume bp meds  * Cocaine abuse. Advised abstinence  *Sleep apnea - CPAP daily at bedtime  D/c home Ok with Dr Wynelle Link CONSULTS  OBTAINED:  Treatment Team:  Lamont Dowdy, MD Clapacs, Jackquline Denmark, MD  DRUG ALLERGIES:  No Known Allergies  DISCHARGE MEDICATIONS:   Current Discharge Medication List    CONTINUE these medications which have NOT CHANGED   Details  aspirin EC 81 MG tablet Take 81 mg by mouth daily.    gabapentin (NEURONTIN) 300 MG capsule Take 600 mg by mouth 3 (three) times daily.     glipiZIDE (GLUCOTROL) 5 MG tablet Take 1 tablet (5 mg total) by mouth 2 (two) times daily before a meal. Qty: 20 tablet, Refills: 0    ipratropium (ATROVENT HFA) 17 MCG/ACT inhaler Inhale into the lungs 2 (two) times daily.    lisinopril-hydrochlorothiazide (PRINZIDE,ZESTORETIC) 20-25 MG tablet Take 1 tablet by mouth daily.    loratadine (CLARITIN) 10 MG tablet Take 10 mg by mouth daily.    lovastatin (MEVACOR) 20 MG tablet Take 20 mg by mouth at bedtime.    metFORMIN (GLUCOPHAGE) 500 MG tablet Take 2 tablets (1,000 mg total) by mouth 2 (two) times daily. Qty: 60 tablet, Refills: 0    sitaGLIPtin (JANUVIA) 100 MG tablet Take 100 mg by mouth daily.    tamsulosin (FLOMAX) 0.4 MG CAPS capsule Take 0.4 mg by mouth.    tiotropium (SPIRIVA) 18 MCG inhalation capsule Place 18 mcg into inhaler and inhale daily.    acetaminophen (TYLENOL) 325 MG tablet Take 650 mg by mouth 3 (three) times daily.    albuterol (PROVENTIL HFA;VENTOLIN  HFA) 108 (90 Base) MCG/ACT inhaler Inhale 2 puffs into the lungs every 6 (six) hours as needed for wheezing or shortness of breath.    oxycodone (OXY-IR) 5 MG capsule Take 5 mg by mouth every 6 (six) hours as needed.        If you experience worsening of your admission symptoms, develop shortness of breath, life threatening emergency, suicidal or homicidal thoughts you must seek medical attention immediately by calling 911 or calling your MD immediately  if symptoms less severe.  You Must read complete instructions/literature along with all the possible adverse reactions/side effects for  all the Medicines you take and that have been prescribed to you. Take any new Medicines after you have completely understood and accept all the possible adverse reactions/side effects.   Please note  You were cared for by a hospitalist during your hospital stay. If you have any questions about your discharge medications or the care you received while you were in the hospital after you are discharged, you can call the unit and asked to speak with the hospitalist on call if the hospitalist that took care of you is not available. Once you are discharged, your primary care physician will handle any further medical issues. Please note that NO REFILLS for any discharge medications will be authorized once you are discharged, as it is imperative that you return to your primary care physician (or establish a relationship with a primary care physician if you do not have one) for your aftercare needs so that they can reassess your need for medications and monitor your lab values. Today   SUBJECTIVE   No new complaints  VITAL SIGNS:  Blood pressure 121/74, pulse 92, temperature 97.6 F (36.4 C), temperature source Oral, resp. rate 20, height 5\' 8"  (1.727 m), weight 104.3 kg (230 lb), SpO2 96 %.  I/O:   Intake/Output Summary (Last 24 hours) at 11/26/16 1108 Last data filed at 11/26/16 0900  Gross per 24 hour  Intake          2667.67 ml  Output             3000 ml  Net          -332.33 ml    PHYSICAL EXAMINATION:  GENERAL:  56 y.o.-year-old patient lying in the bed with no acute distress.  EYES: Pupils equal, round, reactive to light and accommodation. No scleral icterus. Extraocular muscles intact.  HEENT: Head atraumatic, normocephalic. Oropharynx and nasopharynx clear.cervical collar -chronic  NECK:  Supple, no jugular venous distention. No thyroid enlargement, no tenderness.  LUNGS: Normal breath sounds bilaterally, no wheezing, rales,rhonchi or crepitation. No use of accessory muscles of  respiration.  CARDIOVASCULAR: S1, S2 normal. No murmurs, rubs, or gallops.  ABDOMEN: Soft, non-tender, non-distended. Bowel sounds present. No organomegaly or mass.  EXTREMITIES: No pedal edema, cyanosis, or clubbing.  NEUROLOGIC: Cranial nerves II through XII are intact. Muscle strength 5/5 in all extremities. Sensation intact. Gait not checked.  PSYCHIATRIC: The patient is alert and oriented x 2  SKIN: No obvious rash, lesion, or ulcer.   DATA REVIEW:   CBC   Recent Labs Lab 11/25/16 0633  WBC 7.2  HGB 12.5*  HCT 36.8*  PLT 220    Chemistries   Recent Labs Lab 11/24/16 1934  11/26/16 0501  NA 136  < > 138  K 4.3  < > 4.9  CL 100*  < > 106  CO2 22  < > 27  GLUCOSE 126*  < >  138*  BUN 38*  < > 23*  CREATININE 4.90*  < > 1.19  CALCIUM 9.3  < > 8.7*  AST 52*  --   --   ALT 24  --   --   ALKPHOS 59  --   --   BILITOT 0.8  --   --   < > = values in this interval not displayed.  Microbiology Results   No results found for this or any previous visit (from the past 240 hour(s)).  RADIOLOGY:  Dg Chest 2 View  Result Date: 11/24/2016 CLINICAL DATA:  Body aches following fall. EXAM: CHEST  2 VIEW COMPARISON:  10/31/2015 chest radiograph FINDINGS: Upper limits normal heart size and mild peribronchial thickening again noted. There is no evidence of focal airspace disease, pulmonary edema, suspicious pulmonary nodule/mass, pleural effusion, or pneumothorax. No acute bony abnormalities are identified. Remote left rib fractures are present. IMPRESSION: No evidence of acute cardiopulmonary disease. Electronically Signed   By: Harmon PierJeffrey  Hu M.D.   On: 11/24/2016 18:23   Ct Head Wo Contrast  Result Date: 11/24/2016 CLINICAL DATA:  Status post fall last night with head and neck pain. Patient [facility] a [facility] EXAM: CT HEAD WITHOUT CONTRAST CT CERVICAL SPINE WITHOUT CONTRAST TECHNIQUE: Multidetector CT imaging of the head and cervical spine was performed  following the standard protocol without intravenous contrast. Multiplanar CT image reconstructions of the cervical spine were also generated. COMPARISON:  None. FINDINGS: CT HEAD FINDINGS Brain: No evidence of acute infarction, hemorrhage, hydrocephalus, extra-axial collection or mass lesion/mass effect. Vascular: No hyperdense vessel or unexpected calcification. Skull: Normal. Negative for fracture or focal lesion. Sinuses/Orbits: No acute finding. Other: None. CT CERVICAL SPINE FINDINGS Alignment: There is straightening of cervical spine. Skull base and vertebrae: The patient status post prior anterior fusion of C3 through C5 without malalignment. There are degenerative joint changes of the lower cervical spine with narrowed joint space and osteophyte formation. Soft tissues and spinal canal: Unremarkable on this noncontrast exam. Disc levels: Degenerative joint changes with narrowed disc space and osteophyte formation are identified in the lower cervical spine. Upper chest: Unremarkable. Other: Negative. IMPRESSION: No focal acute intracranial abnormality identified. No acute fracture or dislocation of cervical spine. Postsurgical changes of C3 through C5 without malalignment. Electronically Signed   By: Sherian ReinWei-Chen  Lin M.D.   On: 11/24/2016 18:10   Ct Cervical Spine Wo Contrast  Result Date: 11/24/2016 CLINICAL DATA:  Status post fall last night with head and neck pain. Patient [facility] a [facility] EXAM: CT HEAD WITHOUT CONTRAST CT CERVICAL SPINE WITHOUT CONTRAST TECHNIQUE: Multidetector CT imaging of the head and cervical spine was performed following the standard protocol without intravenous contrast. Multiplanar CT image reconstructions of the cervical spine were also generated. COMPARISON:  None. FINDINGS: CT HEAD FINDINGS Brain: No evidence of acute infarction, hemorrhage, hydrocephalus, extra-axial collection or mass lesion/mass effect. Vascular: No hyperdense vessel or unexpected  calcification. Skull: Normal. Negative for fracture or focal lesion. Sinuses/Orbits: No acute finding. Other: None. CT CERVICAL SPINE FINDINGS Alignment: There is straightening of cervical spine. Skull base and vertebrae: The patient status post prior anterior fusion of C3 through C5 without malalignment. There are degenerative joint changes of the lower cervical spine with narrowed joint space and osteophyte formation. Soft tissues and spinal canal: Unremarkable on this noncontrast exam. Disc levels: Degenerative joint changes with narrowed disc space and osteophyte formation are identified in the lower cervical spine.  Upper chest: Unremarkable. Other: Negative. IMPRESSION: No focal acute intracranial abnormality identified. No acute fracture or dislocation of cervical spine. Postsurgical changes of C3 through C5 without malalignment. Electronically Signed   By: Sherian Rein M.D.   On: 11/24/2016 18:10   US Renal  Result Date: 11/25/2016 CLINICAL DATA:  56 year old hypertensive diabetic male with acute kidney insufficiency. Initial encounter. EXAM: RENAL / URINARY TRACT ULTRASOUND COMPLETE COMPARISON:  11/29/2015 abdominal sonogram. FINDINGS: Right Kidney: Length: 11.7 cm. Echogenicity within normal limits. No mass or hydronephrosis visualized. Left Kidney: Length: 12.6 cm. No hydronephrosis. Question tiny nonobstructing renal calculi versus vascular calcifications Bladder: Appears normal for degree of bladder distention. IMPRESSION: No hydronephrosis. Question nonobstructing left renal calculi versus vascular calcifications. Electronically Signed   By: Lacy Duverney M.D.   On: 11/25/2016 11:14     Management plans discussed with the patient, family and they are in agreement.  CODE STATUS:     Code Status Orders        Start     Ordered   11/24/16 2255  Full code  Continuous     11/24/16 2254    Code Status History    Date Active Date Inactive Code Status Order ID Comments User Context    09/19/2016 11:53 PM 09/21/2016  7:38 PM Full Code [id number]  Ramonita Lab, MD Inpatient      TOTAL TIME TAKING CARE OF THIS PATIENT: 40 minutes.    Kaedon Fanelli M.D on 11/26/2016 at 11:08 AM  Between 7am to 6pm - Pager - [phone] After 6pm go to [email or web address] - password [email or web address]  [facility]  Office  [phone]  CC: Primary care physician; System, Pcp Not In

## 2016-11-26 NOTE — Discharge Instructions (Addendum)
Pt recommended to f/u  With PCP Acute Kidney Injury, Adult Acute kidney injury is a sudden worsening of kidney function. The kidneys are organs that have several jobs. They filter the blood to remove waste products and extra fluid. They also maintain a healthy balance of minerals and hormones in the body, which helps control blood pressure and keep bones strong. With this condition, your kidneys do not do their jobs as well as they should. This condition ranges from mild to severe. Over time it may develop into long-lasting (chronic) kidney disease. Early detection and treatment may prevent acute kidney injury from developing into a chronic condition. What are the causes? Common causes of this condition include:  A problem with blood flow to the kidneys. This may be caused by: ? Low blood pressure (hypotension) or shock. ? Blood loss. ? Heart and blood vessel (cardiovascular) disease. ? Severe burns. ? Liver disease.  Direct damage to the kidneys. This may be caused by: ? Certain medicines. ? A kidney infection. ? Poisoning. ? Being around or in contact with toxic substances. ? A surgical wound. ? A hard, direct hit to the kidney area.  A sudden blockage of urine flow. This may be caused by: ? Cancer. ? Kidney stones. ? An enlarged prostate in males.  What are the signs or symptoms? Symptoms of this condition may not be obvious until the condition becomes severe. Symptoms of this condition can include:  Tiredness (lethargy), or difficulty staying awake.  Nausea or vomiting.  Swelling (edema) of the face, legs, ankles, or feet.  Problems with urination, such as: ? Abdominal pain, or pain along the side of your stomach (flank). ? Decreased urine production. ? Decrease in the force of urine flow.  Muscle twitches and cramps, especially in the legs.  Confusion or trouble concentrating.  Loss of appetite.  Fever.  How is this diagnosed? This condition may be diagnosed  with tests, including:  Blood tests.  Urine tests.  Imaging tests.  A test in which a sample of tissue is removed from the kidneys to be examined under a microscope (kidney biopsy).  How is this treated? Treatment for this condition depends on the cause and how severe the condition is. In mild cases, treatment may not be needed. The kidneys may heal on their own. In more severe cases, treatment will involve:  Treating the cause of the kidney injury. This may involve changing any medicines you are taking or adjusting your dosage.  Fluids. You may need specialized IV fluids to balance your body's needs.  Having a catheter placed to drain urine and prevent blockages.  Preventing problems from occurring. This may mean avoiding certain medicines or procedures that can cause further injury to the kidneys.  In some cases treatment may also require:  A procedure to remove toxic wastes from the body (dialysis or continuous renal replacement therapy - CRRT).  Surgery. This may be done to repair a torn kidney, or to remove the blockage from the urinary system.  Follow these instructions at home: Medicines  Take over-the-counter and prescription medicines only as told by your health care provider.  Do not take any new medicines without your health care provider's approval. Many medicines can worsen your kidney damage.  Do not take any vitamin and mineral supplements without your health care provider's approval. Many nutritional supplements can worsen your kidney damage. Lifestyle  If your health care provider prescribed changes to your diet, follow them. You may need to decrease the  amount of protein you eat.  Achieve and maintain a healthy weight. If you need help with this, ask your health care provider.  Start or continue an exercise plan. Try to exercise at least 30 minutes a day, 5 days a week.  Do not use any tobacco products, such as cigarettes, chewing tobacco, and  e-cigarettes. If you need help quitting, ask your health care provider. General instructions  Keep track of your blood pressure. Report changes in your blood pressure as told by your health care provider.  Stay up to date with immunizations. Ask your health care provider which immunizations you need.  Keep all follow-up visits as told by your health care provider. This is important. Where to find more information:  American Association of Kidney Patients: ResidentialShow.is  [employer]: www.kidney.org  [employer]: FightingMatch.com.ee  Life Options Rehabilitation Program: ? www.lifeoptions.org ? www.kidneyschool.org Contact a health care provider if:  Your symptoms get worse.  You develop new symptoms. Get help right away if:  You develop symptoms of worsening kidney disease, which include: ? Headaches. ? Abnormally dark or light skin. ? Easy bruising. ? Frequent hiccups. ? Chest pain. ? Shortness of breath. ? End of menstruation in women. ? Seizures. ? Confusion or altered mental status. ? Abdominal or back pain. ? Itchiness.  You have a fever.  Your body is producing less urine.  You have pain or bleeding when you urinate. Summary  Acute kidney injury is a sudden worsening of kidney function.  Acute kidney injury can be caused by problems with blood flow to the kidneys, direct damage to the kidneys, and sudden blockage of urine flow.  Symptoms of this condition may not be obvious until it becomes severe. Symptoms may include edema, lethargy, confusion, nausea or vomiting, and problems passing urine.  This condition can usually be diagnosed with blood tests, urine tests, and imaging tests. Sometimes a kidney biopsy is done to diagnose this condition.  Treatment for this condition often involves treating the underlying cause. It is treated with fluids, medicines, dialysis, diet changes, or surgery. This information is not intended to replace advice  given to you by your health care provider. Make sure you discuss any questions you have with your health care provider. Document Released: 11/25/2010 Document Revised: 05/02/2016 Document Reviewed: 05/02/2016 Elsevier Interactive Patient Education  2017 [employer].

## 2016-11-26 NOTE — Progress Notes (Signed)
Pt to be discharged per MD order. IV removed. Instructions reviewed with pt and all questions answered. Pt signed discharge verbalizing understanding. NO new scripts of follow ups needed. Awaiting ride, will discharge in wheelchair when ready.

## 2016-11-26 NOTE — Progress Notes (Signed)
[facility]  ROUNDING NOTE   Subjective:   Creatinine 1.19 (4.31) NS at 16400mL/hr  Objective:  Vital signs in last 24 hours:  Temp:  [97.6 F (36.4 C)-98.8 F (37.1 C)] 97.6 F (36.4 C) (07/04 0435) Pulse Rate:  [91-95] 92 (07/04 0435) Resp:  [20] 20 (07/04 0435) BP: (119-126)/(53-74) 121/74 (07/04 0435) SpO2:  [96 %-100 %] 96 % (07/04 0435)  Weight change:  Filed Weights   11/24/16 1703  Weight: 104.3 kg (230 lb)    Intake/Output: I/O last 3 completed shifts: In: 4593 [P.O.:1380; I.V.:2263; IV Piggyback:950] Out: 2925 [Urine:2925]   Intake/Output this shift:  Total I/O In: 501.7 [P.O.:240; I.V.:261.7] Out: 1200 [Urine:1200]  Physical Exam: General: NAD, laying in bed  Head: Normocephalic, atraumatic. Moist oral mucosal membranes  Eyes: Anicteric, PERRL  Neck: Cervical spine brace  Lungs:  Clear to auscultation  Heart: Regular rate and rhythm  Abdomen:  Soft, nontender,   Extremities:  no peripheral edema.  Neurologic: Nonfocal, moving all four extremities  Skin: No lesions       Basic Metabolic Panel:  Recent Labs Lab 11/24/16 1934 11/25/16 0633 11/26/16 0501  NA 136 137 138  K 4.3 4.2 4.9  CL 100* 103 106  CO2 22 25 27   GLUCOSE 126* 167* 138*  BUN 38* 43* 23*  CREATININE 4.90* 4.31* 1.19  CALCIUM 9.3 8.3* 8.7*    Liver Function Tests:  Recent Labs Lab 11/24/16 1934  AST 52*  ALT 24  ALKPHOS 59  BILITOT 0.8  PROT 8.0  ALBUMIN 3.8   No results for input(s): LIPASE, AMYLASE in the last 168 hours. No results for input(s): AMMONIA in the last 168 hours.  CBC:  Recent Labs Lab 11/24/16 1934 11/25/16 0633  WBC 8.2 7.2  NEUTROABS 4.7  --   HGB 13.8 12.5*  HCT 40.9 36.8*  MCV 87.5 88.8  PLT 275 220    Cardiac Enzymes:  Recent Labs Lab 11/24/16 1934 11/25/16 0633 11/26/16 0501  CKTOTAL  --  1,313* 1,079*  TROPONINI <0.03  --   --     BNP: Invalid input(s): POCBNP  CBG:  Recent Labs Lab 11/25/16 1243  11/25/16 1715 11/25/16 2119 11/26/16 0731 11/26/16 1145  GLUCAP 157* 194* 130* 126* 149*    Microbiology: No results found for this or any previous visit.  Coagulation Studies: No results for input(s): LABPROT, INR in the last 72 hours.  Urinalysis:  Recent Labs  11/25/16 0819  COLORURINE YELLOW*  LABSPEC 1.019  PHURINE 5.0  GLUCOSEU NEGATIVE  HGBUR NEGATIVE  BILIRUBINUR NEGATIVE  KETONESUR NEGATIVE  PROTEINUR 30*  NITRITE NEGATIVE  LEUKOCYTESUR NEGATIVE      Imaging: Dg Chest 2 View  Result Date: 11/24/2016 CLINICAL DATA:  Body aches following fall. EXAM: CHEST  2 VIEW COMPARISON:  10/31/2015 chest radiograph FINDINGS: Upper limits normal heart size and mild peribronchial thickening again noted. There is no evidence of focal airspace disease, pulmonary edema, suspicious pulmonary nodule/mass, pleural effusion, or pneumothorax. No acute bony abnormalities are identified. Remote left rib fractures are present. IMPRESSION: No evidence of acute cardiopulmonary disease. Electronically Signed   By: Harmon PierJeffrey  Hu M.D.   On: 11/24/2016 18:23   Ct Head Wo Contrast  Result Date: 11/24/2016 CLINICAL DATA:  Status post fall last night with head and neck pain. Patient [facility] EXAM: CT HEAD WITHOUT CONTRAST CT CERVICAL SPINE WITHOUT CONTRAST TECHNIQUE: Multidetector CT imaging of the head and cervical spine was  performed following the standard protocol without intravenous contrast. Multiplanar CT image reconstructions of the cervical spine were also generated. COMPARISON:  None. FINDINGS: CT HEAD FINDINGS Brain: No evidence of acute infarction, hemorrhage, hydrocephalus, extra-axial collection or mass lesion/mass effect. Vascular: No hyperdense vessel or unexpected calcification. Skull: Normal. Negative for fracture or focal lesion. Sinuses/Orbits: No acute finding. Other: None. CT CERVICAL SPINE FINDINGS Alignment: There is straightening of cervical spine.  Skull base and vertebrae: The patient status post prior anterior fusion of C3 through C5 without malalignment. There are degenerative joint changes of the lower cervical spine with narrowed joint space and osteophyte formation. Soft tissues and spinal canal: Unremarkable on this noncontrast exam. Disc levels: Degenerative joint changes with narrowed disc space and osteophyte formation are identified in the lower cervical spine. Upper chest: Unremarkable. Other: Negative. IMPRESSION: No focal acute intracranial abnormality identified. No acute fracture or dislocation of cervical spine. Postsurgical changes of C3 through C5 without malalignment. Electronically Signed   By: Sherian Rein M.D.   On: 11/24/2016 18:10   Ct Cervical Spine Wo Contrast  Result Date: 11/24/2016 CLINICAL DATA:  Status post fall last night with head and neck pain. Patient has a history of prior neck surgery at [facility]. EXAM: CT HEAD WITHOUT CONTRAST CT CERVICAL SPINE WITHOUT CONTRAST TECHNIQUE: Multidetector CT imaging of the head and cervical spine was performed following the standard protocol without intravenous contrast. Multiplanar CT image reconstructions of the cervical spine were also generated. COMPARISON:  None. FINDINGS: CT HEAD FINDINGS Brain: No evidence of acute infarction, hemorrhage, hydrocephalus, extra-axial collection or mass lesion/mass effect. Vascular: No hyperdense vessel or unexpected calcification. Skull: Normal. Negative for fracture or focal lesion. Sinuses/Orbits: No acute finding. Other: None. CT CERVICAL SPINE FINDINGS Alignment: There is straightening of cervical spine. Skull base and vertebrae: The patient status post prior anterior fusion of C3 through C5 without malalignment. There are degenerative joint changes of the lower cervical spine with narrowed joint space and osteophyte formation. Soft tissues and spinal canal: Unremarkable on this noncontrast exam. Disc levels: Degenerative joint changes with narrowed  disc space and osteophyte formation are identified in the lower cervical spine. Upper chest: Unremarkable. Other: Negative. IMPRESSION: No focal acute intracranial abnormality identified. No acute fracture or dislocation of cervical spine. Postsurgical changes of C3 through C5 without malalignment. Electronically Signed   By: Sherian Rein M.D.   On: 11/24/2016 18:10   US Renal  Result Date: 11/25/2016 CLINICAL DATA:  56 year old hypertensive diabetic male with acute kidney insufficiency. Initial encounter. EXAM: RENAL / URINARY TRACT ULTRASOUND COMPLETE COMPARISON:  11/29/2015 abdominal sonogram. FINDINGS: Right Kidney: Length: 11.7 cm. Echogenicity within normal limits. No mass or hydronephrosis visualized. Left Kidney: Length: 12.6 cm. No hydronephrosis. Question tiny nonobstructing renal calculi versus vascular calcifications Bladder: Appears normal for degree of bladder distention. IMPRESSION: No hydronephrosis. Question nonobstructing left renal calculi versus vascular calcifications. Electronically Signed   By: Lacy Duverney M.D.   On: 11/25/2016 11:14     Medications:   . sodium chloride 100 mL/hr at 11/25/16 1308   . aspirin EC  81 mg Oral Daily  . gabapentin  600 mg Oral TID  . heparin  5,000 Units Subcutaneous Q8H  . insulin aspart  0-5 Units Subcutaneous QHS  . insulin aspart  0-9 Units Subcutaneous TID WC  . mouth rinse  15 mL Mouth Rinse BID  . pravastatin  20 mg Oral q1800  . tamsulosin  0.4 mg Oral Daily  . tiotropium  18 mcg Inhalation  Daily   acetaminophen **OR** acetaminophen, benzocaine, ondansetron **OR** ondansetron (ZOFRAN) IV, oxyCODONE  Assessment/ Plan:  [name] is a 56 y.o. black male  with diabetes mellitus type II, hypertension, obstructive sleep apnea, COPD, who [facility]   1. Acute Renal Failure: baseline creatinine of 1.13 on 09/21/16. Acute renal failure in 08/2016 with recovery. CK level not too elevated.  Acute renal  failure from prerenal azotemia - Creatinine back to baseline.  - Monitor urine output and volume status.   2. Hypertension:  - home regimen of tamsulosin, lisinopril, hydrochlorothiazide  3. 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 On metformin.    LOS: 2 Juan Harper 7/4/201812:00 PM

## 2017-02-26 ENCOUNTER — Encounter: Payer: Self-pay | Admitting: *Deleted

## 2017-02-26 ENCOUNTER — Inpatient Hospital Stay
Admission: EM | Admit: 2017-02-26 | Discharge: 2017-03-01 | DRG: 684 | Disposition: A | Discharge status: Home / Self Care | Payer: Medicaid Other | Attending: Internal Medicine | Admitting: Internal Medicine

## 2017-02-26 ENCOUNTER — Inpatient Hospital Stay: Payer: Medicaid Other

## 2017-02-26 DIAGNOSIS — N400 Enlarged prostate without lower urinary tract symptoms: Secondary | ICD-10-CM | POA: Diagnosis present

## 2017-02-26 DIAGNOSIS — Z9841 Cataract extraction status, right eye: Secondary | ICD-10-CM | POA: Diagnosis not present

## 2017-02-26 DIAGNOSIS — N179 Acute kidney failure, unspecified: Secondary | ICD-10-CM | POA: Diagnosis not present

## 2017-02-26 DIAGNOSIS — Z7982 Long term (current) use of aspirin: Secondary | ICD-10-CM | POA: Diagnosis not present

## 2017-02-26 DIAGNOSIS — I129 Hypertensive chronic kidney disease with stage 1 through stage 4 chronic kidney disease, or unspecified chronic kidney disease: Secondary | ICD-10-CM | POA: Diagnosis present

## 2017-02-26 DIAGNOSIS — R55 Syncope and collapse: Secondary | ICD-10-CM

## 2017-02-26 DIAGNOSIS — Z79899 Other long term (current) drug therapy: Secondary | ICD-10-CM

## 2017-02-26 DIAGNOSIS — Z7984 Long term (current) use of oral hypoglycemic drugs: Secondary | ICD-10-CM

## 2017-02-26 DIAGNOSIS — F17210 Nicotine dependence, cigarettes, uncomplicated: Secondary | ICD-10-CM | POA: Diagnosis present

## 2017-02-26 DIAGNOSIS — G4730 Sleep apnea, unspecified: Secondary | ICD-10-CM | POA: Diagnosis present

## 2017-02-26 DIAGNOSIS — I639 Cerebral infarction, unspecified: Secondary | ICD-10-CM

## 2017-02-26 DIAGNOSIS — Z961 Presence of intraocular lens: Secondary | ICD-10-CM | POA: Diagnosis present

## 2017-02-26 DIAGNOSIS — E860 Dehydration: Secondary | ICD-10-CM | POA: Diagnosis present

## 2017-02-26 DIAGNOSIS — Z23 Encounter for immunization: Secondary | ICD-10-CM

## 2017-02-26 DIAGNOSIS — Z981 Arthrodesis status: Secondary | ICD-10-CM | POA: Diagnosis not present

## 2017-02-26 DIAGNOSIS — N189 Chronic kidney disease, unspecified: Secondary | ICD-10-CM | POA: Diagnosis present

## 2017-02-26 DIAGNOSIS — E1122 Type 2 diabetes mellitus with diabetic chronic kidney disease: Secondary | ICD-10-CM | POA: Diagnosis present

## 2017-02-26 DIAGNOSIS — R42 Dizziness and giddiness: Secondary | ICD-10-CM | POA: Diagnosis not present

## 2017-02-26 DIAGNOSIS — J449 Chronic obstructive pulmonary disease, unspecified: Secondary | ICD-10-CM | POA: Diagnosis present

## 2017-02-26 LAB — CBC WITH DIFFERENTIAL/PLATELET
BASOS PCT: 1 %
Basophils Absolute: 0 10*3/uL (ref 0–0.1)
EOS ABS: 0.2 10*3/uL (ref 0–0.7)
EOS PCT: 2 %
HCT: 37.6 % — ABNORMAL LOW (ref 40.0–52.0)
HEMOGLOBIN: 12.8 g/dL — AB (ref 13.0–18.0)
Lymphocytes Relative: 22 %
Lymphs Abs: 1.6 10*3/uL (ref 1.0–3.6)
MCH: 29.6 pg (ref 26.0–34.0)
MCHC: 34 g/dL (ref 32.0–36.0)
MCV: 87.2 fL (ref 80.0–100.0)
Monocytes Absolute: 1 10*3/uL (ref 0.2–1.0)
Monocytes Relative: 14 %
NEUTROS PCT: 61 %
Neutro Abs: 4.3 10*3/uL (ref 1.4–6.5)
PLATELETS: 238 10*3/uL (ref 150–440)
RBC: 4.32 MIL/uL — AB (ref 4.40–5.90)
RDW: 14.3 % (ref 11.5–14.5)
WBC: 7.1 10*3/uL (ref 3.8–10.6)

## 2017-02-26 LAB — BASIC METABOLIC PANEL
Anion gap: 13 (ref 5–15)
BUN: 33 mg/dL — AB (ref 6–20)
CHLORIDE: 102 mmol/L (ref 101–111)
CO2: 21 mmol/L — ABNORMAL LOW (ref 22–32)
CREATININE: 3.07 mg/dL — AB (ref 0.61–1.24)
Calcium: 9.3 mg/dL (ref 8.9–10.3)
GFR, EST AFRICAN AMERICAN: 24 mL/min — AB (ref >60)
GFR, EST NON AFRICAN AMERICAN: 21 mL/min — AB (ref >60)
Glucose, Bld: 161 mg/dL — ABNORMAL HIGH (ref 65–99)
POTASSIUM: 4.6 mmol/L (ref 3.5–5.1)
SODIUM: 136 mmol/L (ref 135–145)

## 2017-02-26 LAB — TROPONIN I

## 2017-02-26 LAB — GLUCOSE, CAPILLARY
GLUCOSE-CAPILLARY: 160 mg/dL — AB (ref 65–99)
GLUCOSE-CAPILLARY: 128 mg/dL — AB (ref 65–99)

## 2017-02-26 MED ORDER — DOCUSATE SODIUM 100 MG PO CAPS: 100.0000 mg | ORAL_CAPSULE | Freq: Two times a day (BID) | ORAL | Status: DC | PRN

## 2017-02-26 MED ORDER — OXYCODONE HCL 5 MG PO TABS
5.0000 mg | ORAL_TABLET | Freq: Four times a day (QID) | ORAL | Status: DC | PRN
  Administered 2017-02-26 – 2017-03-01 (×7): 5 mg via ORAL
  Filled 2017-02-26 (×7): qty 1

## 2017-02-26 MED ORDER — SODIUM CHLORIDE 0.9 % IV SOLN
INTRAVENOUS | Status: DC
  Administered 2017-02-26 – 2017-03-01 (×9): via INTRAVENOUS

## 2017-02-26 MED ORDER — ATORVASTATIN CALCIUM 20 MG PO TABS
40.0000 mg | ORAL_TABLET | Freq: Every day | ORAL | Status: DC
  Administered 2017-02-27 – 2017-03-01 (×3): 40 mg via ORAL
  Filled 2017-02-26 (×3): qty 2

## 2017-02-26 MED ORDER — LORATADINE 10 MG PO TABS
10.0000 mg | ORAL_TABLET | Freq: Every day | ORAL | Status: DC
  Administered 2017-02-27 – 2017-03-01 (×3): 10 mg via ORAL
  Filled 2017-02-26 (×3): qty 1

## 2017-02-26 MED ORDER — MOMETASONE FURO-FORMOTEROL FUM 100-5 MCG/ACT IN AERO
2.0000 | INHALATION_SPRAY | Freq: Two times a day (BID) | RESPIRATORY_TRACT | Status: DC
  Administered 2017-02-26 – 2017-03-01 (×6): 2 via RESPIRATORY_TRACT
  Filled 2017-02-26: qty 8.8

## 2017-02-26 MED ORDER — IPRATROPIUM-ALBUTEROL 0.5-2.5 (3) MG/3ML IN SOLN
3.0000 mL | RESPIRATORY_TRACT | Status: DC
  Administered 2017-02-26 – 2017-02-28 (×8): 3 mL via RESPIRATORY_TRACT
  Filled 2017-02-26 (×6): qty 3
  Filled 2017-02-26: qty 39
  Filled 2017-02-26 (×3): qty 3

## 2017-02-26 MED ORDER — INFLUENZA VAC SPLIT QUAD 0.5 ML IM SUSY
0.5000 mL | PREFILLED_SYRINGE | INTRAMUSCULAR | Status: AC
  Administered 2017-02-27: 0.5 mL via INTRAMUSCULAR
  Filled 2017-02-26: qty 0.5

## 2017-02-26 MED ORDER — INSULIN ASPART 100 UNIT/ML ~~LOC~~ SOLN
0.0000 [IU] | Freq: Three times a day (TID) | SUBCUTANEOUS | Status: DC
  Administered 2017-02-26: 2 [IU] via SUBCUTANEOUS
  Administered 2017-02-27: 3 [IU] via SUBCUTANEOUS
  Administered 2017-02-27 – 2017-02-28 (×4): 2 [IU] via SUBCUTANEOUS
  Administered 2017-03-01: 1 [IU] via SUBCUTANEOUS
  Administered 2017-03-01: 3 [IU] via SUBCUTANEOUS
  Filled 2017-02-26 (×8): qty 1

## 2017-02-26 MED ORDER — TAMSULOSIN HCL 0.4 MG PO CAPS
0.8000 mg | ORAL_CAPSULE | Freq: Every morning | ORAL | Status: DC
  Administered 2017-02-27: 0.8 mg via ORAL
  Filled 2017-02-26: qty 2

## 2017-02-26 MED ORDER — TIOTROPIUM BROMIDE MONOHYDRATE 18 MCG IN CAPS
18.0000 ug | ORAL_CAPSULE | Freq: Every day | RESPIRATORY_TRACT | Status: DC
  Administered 2017-02-27 – 2017-03-01 (×3): 18 ug via RESPIRATORY_TRACT
  Filled 2017-02-26: qty 5

## 2017-02-26 MED ORDER — SODIUM CHLORIDE 0.9 % IV BOLUS (SEPSIS)
1000.0000 mL | Freq: Once | INTRAVENOUS | Status: AC
  Administered 2017-02-26: 1000 mL via INTRAVENOUS

## 2017-02-26 MED ORDER — GABAPENTIN 300 MG PO CAPS
600.0000 mg | ORAL_CAPSULE | Freq: Two times a day (BID) | ORAL | Status: DC
  Administered 2017-02-26 – 2017-03-01 (×6): 600 mg via ORAL
  Filled 2017-02-26 (×6): qty 2

## 2017-02-26 MED ORDER — HEPARIN SODIUM (PORCINE) 5000 UNIT/ML IJ SOLN
5000.0000 [IU] | Freq: Three times a day (TID) | INTRAMUSCULAR | Status: DC
  Administered 2017-02-26 – 2017-03-01 (×8): 5000 [IU] via SUBCUTANEOUS
  Filled 2017-02-26 (×8): qty 1

## 2017-02-26 NOTE — ED Notes (Signed)
Spoke with iv team staff, waiting for iv insertion

## 2017-02-26 NOTE — ED Notes (Signed)
Pt resting with eyes closed  Oxygen sats 89% on room air.  Pt placed on 2 liters via nasal cannula.

## 2017-02-26 NOTE — H&P (Signed)
[employer] -  at [facility]   PATIENT NAME: Juan Harper    MR#:  [id number]  DATE OF BIRTH:  05/29/1960  DATE OF ADMISSION:  02/26/2017  PRIMARY CARE PHYSICIAN: System, Pcp Not In   REQUESTING/REFERRING PHYSICIAN: [place]  CHIEF COMPLAINT:   Chief Complaint  Patient presents with  . Near Syncope    HISTORY OF PRESENT ILLNESS: Juan Harper  is a 56 y.o. male with a known history of BPH, Cervical spine fusion surgery April 2018, DM, Htn, Sleep apnea- Came with Dizziness and syncopal episodes. Also have some strong smell in urine. Denies using any meds or drugs other than his prescriptions.  Noted to have Ac renal failure in ER, BP stable. Feeling veery thirsty. Ur sample not yet collected for UA and drug screen. Given to hospitalist for Ac renal failure.  PAST MEDICAL HISTORY:   Past Medical History:  Diagnosis Date  . Arthritis    NECK/ RIGHT KNEE  . Bell's palsy   . BPH (benign prostatic hyperplasia)   . Cough    chronic  . Diabetes mellitus without complication (HCC)   . Edema    legs/feet  . 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 Hypertension   . Orthopnea   . Shortness of breath dyspnea   . Sleep apnea    cpap  . Wheezing     PAST SURGICAL HISTORY: Past Surgical History:  Procedure Laterality Date  . BACK SURGERY    . CATARACT EXTRACTION W/PHACO Right 07/31/2016   Procedure: CATARACT EXTRACTION PHACO AND INTRAOCULAR LENS PLACEMENT (IOC);  Surgeon: Nevada Crane, MD;  Location: [facility] ORS;  Service: Ophthalmology;  Laterality: Right;  Lot # [id number] H US:00:26.9 AP%:6.8% CDE: 1.83  . COLONOSCOPY    . NECK MASS EXCISION    . PILONIDAL CYST EXCISION      SOCIAL HISTORY:  Social History  Substance Use Topics  . Smoking status: Current Every Day Smoker    Packs/day: 0.50    Types: Cigarettes  . Smokeless tobacco: Never Used  . Alcohol use No    FAMILY HISTORY:  Family History  Problem Relation Age of Onset   . Hypertension Mother     DRUG ALLERGIES: No Known Allergies  REVIEW OF SYSTEMS:   CONSTITUTIONAL: No fever,positive for fatigue or weakness.  EYES: No blurred or double vision.  EARS, NOSE, AND THROAT: No tinnitus or ear pain.  RESPIRATORY: No cough, shortness of breath, wheezing or hemoptysis.  CARDIOVASCULAR: No chest pain, orthopnea, edema.  GASTROINTESTINAL: No nausea, vomiting, diarrhea or abdominal pain.  GENITOURINARY: No dysuria, hematuria.  ENDOCRINE: No polyuria, nocturia,  HEMATOLOGY: No anemia, easy bruising or bleeding SKIN: No rash or lesion. MUSCULOSKELETAL: No joint pain or arthritis.   NEUROLOGIC: No tingling, numbness, weakness.  PSYCHIATRY: No anxiety or depression.   MEDICATIONS AT HOME:  Prior to Admission medications   Medication Sig Start Date End Date Taking? Authorizing Provider  albuterol (PROVENTIL HFA;VENTOLIN HFA) 108 (90 Base) MCG/ACT inhaler Inhale 2 puffs into the lungs every 6 (six) hours as needed for wheezing or shortness of breath.   Yes [provider]  aspirin EC 81 MG tablet Take 81 mg by mouth daily.   Yes [provider]  atorvastatin (LIPITOR) 40 MG tablet Take 40 mg by mouth daily.   Yes [provider]  budesonide-formoterol (SYMBICORT) 80-4.5 MCG/ACT inhaler Inhale 2 puffs into the lungs 2 (two) times daily.   Yes [provider]  diclofenac sodium (VOLTAREN) 1 % GEL Apply 2 g topically 2 (two) times daily.   Yes [provider]  gabapentin (NEURONTIN) 300 MG capsule Take 600 mg by mouth 2 (two) times daily. Can increase  three times a day if needed   Yes [provider]  glipiZIDE (GLUCOTROL) 5 MG tablet Take 1 tablet (5 mg total) by mouth 2 (two) times daily before a meal. Patient taking differently: Take 15 mg by mouth daily.  09/05/16  Yes Willy Eddy, MD  ipratropium (ATROVENT HFA) 17 MCG/ACT inhaler Inhale into the lungs 2 (two) times daily.   Yes [provider]  lisinopril-hydrochlorothiazide (PRINZIDE,ZESTORETIC) 20-25 MG tablet Take 1 tablet by mouth daily.   Yes [provider]  loratadine (CLARITIN) 10 MG tablet Take 10 mg by mouth daily.   Yes [provider]  metFORMIN (GLUCOPHAGE) 500 MG tablet Take 2 tablets (1,000 mg total) by mouth 2 (two) times daily. 09/05/16  Yes Willy Eddy, MD  sitaGLIPtin (JANUVIA) 100 MG tablet Take 100 mg by mouth daily.   Yes [provider]  tamsulosin (FLOMAX) 0.4 MG CAPS capsule Take 0.8 mg by mouth every morning.    Yes [provider]  tiotropium (SPIRIVA) 18 MCG inhalation capsule Place 18 mcg into inhaler and inhale daily. 07/16/16 07/16/17 Yes [provider]  acetaminophen (TYLENOL) 325 MG tablet Take 650 mg by mouth 3 (three) times daily.    [provider]  oxycodone (OXY-IR) 5 MG capsule Take 5 mg by mouth every 6 (six) hours as needed.    [provider]      PHYSICAL EXAMINATION:   VITAL SIGNS: Blood pressure 111/66, pulse 98, temperature 98.9 F (37.2 C), temperature source Oral, resp. rate (!) 23, height  (1.803 m), weight 110.2 kg (243 lb), SpO2 97 %.  GENERAL:  55 y.o.-year-old patient lying in the bed with no acute distress.  EYES: Pupils equal, round, reactive to light and accommodation. No scleral icterus. Extraocular muscles intact.  HEENT: Head atraumatic, normocephalic. Oropharynx and nasopharynx clear. Have soft cervical collar in place. NECK:  Supple, no jugular venous distention. No thyroid enlargement, no tenderness.  LUNGS: Normal breath sounds bilaterally, no wheezing, rales,rhonchi or crepitation. No use of accessory muscles of respiration.  CARDIOVASCULAR: S1, S2 normal. No murmurs, rubs, or gallops.  ABDOMEN: Soft, nontender, nondistended. Bowel sounds present. No organomegaly or mass.  EXTREMITIES: No pedal edema, cyanosis, or clubbing.  NEUROLOGIC: Cranial nerves II through XII are intact. Muscle strength  4-5/5 in all extremities. Sensation intact. Gait not checked.  PSYCHIATRIC: The patient is alert and oriented x 3.  SKIN: No obvious rash, lesion, or ulcer.   LABORATORY PANEL:   CBC  Recent Labs Lab 02/26/17 1412  WBC 7.1  HGB 12.8*  HCT 37.6*  PLT 238  MCV 87.2  MCH 29.6  MCHC 34.0  RDW 14.3  LYMPHSABS 1.6  MONOABS 1.0  EOSABS 0.2  BASOSABS 0.0   ------------------------------------------------------------------------------------------------------------------  Chemistries   Recent Labs Lab 02/26/17 1412  NA 136  K 4.6  CL 102  CO2 21*  GLUCOSE 161*  BUN 33*  CREATININE 3.07*  CALCIUM 9.3   ------------------------------------------------------------------------------------------------------------------ estimated creatinine clearance is 33.1 mL/min (A) (by C-G formula based on SCr of 3.07 mg/dL (H)). ------------------------------------------------------------------------------------------------------------------ No results for input(s): TSH, T4TOTAL, T3FREE, THYROIDAB in the last 72 hours.  Invalid input(s): FREET3   Coagulation profile No results for input(s): INR, PROTIME in the last 168 hours. ------------------------------------------------------------------------------------------------------------------- No  results for input(s): DDIMER in the last 72 hours. -------------------------------------------------------------------------------------------------------------------  Cardiac Enzymes  Recent Labs Lab 02/26/17 1412  TROPONINI <0.03   ------------------------------------------------------------------------------------------------------------------ Invalid input(s): POCBNP  ---------------------------------------------------------------------------------------------------------------  Urinalysis    Component Value Date/Time   COLORURINE YELLOW (A) 11/25/2016 0819   APPEARANCEUR HAZY (A) 11/25/2016 0819   LABSPEC 1.019 11/25/2016 0819    PHURINE 5.0 11/25/2016 0819   GLUCOSEU NEGATIVE 11/25/2016 0819   HGBUR NEGATIVE 11/25/2016 0819   BILIRUBINUR NEGATIVE 11/25/2016 0819   KETONESUR NEGATIVE 11/25/2016 0819   PROTEINUR 30 (A) 11/25/2016 0819   NITRITE NEGATIVE 11/25/2016 0819   LEUKOCYTESUR NEGATIVE 11/25/2016 0819     RADIOLOGY: No results found.  EKG: Orders placed or performed during the hospital encounter of 02/26/17  . ED EKG  . ED EKG  . EKG 12-Lead  . EKG 12-Lead    IMPRESSION AND PLAN:  * Ac renal failure   Likely due to Meds and dehydration   UA and Ur drug screen not checked yet.   IV fluids, monitor.   Hold BP meds.  * Dizziness and syncope   IV fluids, Check orthostatic   Check UA.   Hold BP meds.  * DM   Hold oral meds, keep on ISS.  * Htn   Hold meds for now as BP is normal.  * COPD  No exacerbation signs.   Duoneb for now, Cont spiriva.  * Active smoking   Councelled to Quit smoking for 4 min.  All the records are reviewed and case discussed with ED provider. Management plans discussed with the patient, family and they are in agreement.  CODE STATUS: Full. Code Status History    Date Active Date Inactive Code Status Order ID Comments User Context   11/24/2016 10:54 PM 11/26/2016  6:55 PM Full Code [id number]  Oralia Manis, MD Inpatient   09/19/2016 11:53 PM 09/21/2016  7:38 PM Full Code [id number]  Ramonita Lab, MD Inpatient       TOTAL TIME TAKING CARE OF THIS PATIENT: 50 minutes.    Altamese Dilling M.D on 02/26/2017   Between 7am to 6pm - Pager - [phone]  After 6pm go to [email or web address] - password [email or web address]  [facility]  Office  [phone]  CC: Primary care physician; System, Pcp Not In   Note: This dictation was prepared with Dragon dictation along with smaller phrase technology. Any transcriptional errors that result from this process are unintentional.

## 2017-02-26 NOTE — ED Triage Notes (Signed)
Per EMS report, Patient felt dizzy and sat down in the parking lot. Patient c/o several dizzy spells recently. Patient also c/o chronic neck pain and right knee pain.

## 2017-02-26 NOTE — ED Notes (Signed)
Report called to ashley rn floor nurse °

## 2017-02-26 NOTE — ED Provider Notes (Signed)
[facility] Emergency Department Provider Note  ____________________________________________   First MD Initiated Contact with Patient 02/26/17 1329     (approximate)  I have reviewed the triage vital signs and the nursing notes.   HISTORY  Chief Complaint Near Syncope  HPI Juan Harper is a 56 y.o. male with a history of renal failure, diabetes, orthostatic hypotension who is presenting to the emergency department today with a near-syncopal episode. He says that he was walking with a walking stick when he began to feel dizzy/lightheaded. He lowered himself to the ground onto his right knee. He denies any chest pain. However, he does complain of mild low back pain as well as posterior neck pain which she describes as chronic. He is denying any dizziness or lightheadedness at this time. Says it had multiple episodes similar to this. He was admitted several months ago to the emergency department under similar circumstances and found to have renal failure. Also with a history of cocaine abuse.   Past Medical History:  Diagnosis Date  . Arthritis    NECK/ RIGHT KNEE  . Bell's palsy   . BPH (benign prostatic hyperplasia)   . Cough    chronic  . Diabetes mellitus without complication (HCC)   . Edema    legs/feet  . 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 Hypertension   . Orthopnea   . Shortness of breath dyspnea   . Sleep apnea    cpap  . Wheezing     Patient Active Problem List   Diagnosis Date Noted  . 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 Sleep apnea 11/24/2016  . Diabetes (HCC) 11/24/2016  . HTN (hypertension) 11/24/2016  . Postural dizziness with presyncope 11/24/2016  . Acute on chronic renal failure (HCC) 09/19/2016    Past Surgical History:  Procedure Laterality Date  . BACK SURGERY    . CATARACT EXTRACTION W/PHACO Right 07/31/2016   Procedure: CATARACT EXTRACTION PHACO AND  INTRAOCULAR LENS PLACEMENT (IOC);  Surgeon: Nevada Crane, MD;  Location: [facility] ORS;  Service: Ophthalmology;  Laterality: Right;  Lot # [id number] H US:00:26.9 AP%:6.8% CDE: 1.83  . COLONOSCOPY    . NECK MASS EXCISION    . PILONIDAL CYST EXCISION      Prior to Admission medications   Medication Sig Start Date End Date Taking? Authorizing Provider  acetaminophen (TYLENOL) 325 MG tablet Take 650 mg by mouth 3 (three) times daily.    [provider]  albuterol (PROVENTIL HFA;VENTOLIN HFA) 108 (90 Base) MCG/ACT inhaler Inhale 2 puffs into the lungs every 6 (six) hours as needed for wheezing or shortness of breath.    [provider]  aspirin EC 81 MG tablet Take 81 mg by mouth daily.    [provider]  gabapentin (NEURONTIN) 300 MG capsule Take 600 mg by mouth 3 (three) times daily.     [provider]  glipiZIDE (GLUCOTROL) 5 MG tablet Take 1 tablet (5 mg total) by mouth 2 (two) times daily before a meal. 09/05/16   Willy Eddy, MD  ipratropium (ATROVENT HFA) 17 MCG/ACT inhaler Inhale into the lungs 2 (two) times daily.    [provider]  lisinopril-hydrochlorothiazide (PRINZIDE,ZESTORETIC) 20-25 MG tablet Take 1 tablet by mouth daily.    [provider]  loratadine (CLARITIN) 10 MG tablet Take 10 mg by mouth daily.    [provider]  lovastatin (MEVACOR) 20 MG tablet  Take 20 mg by mouth at bedtime.    [provider]  metFORMIN (GLUCOPHAGE) 500 MG tablet Take 2 tablets (1,000 mg total) by mouth 2 (two) times daily. 09/05/16   Willy Eddy, MD  oxycodone (OXY-IR) 5 MG capsule Take 5 mg by mouth every 6 (six) hours as needed.    [provider]  sitaGLIPtin (JANUVIA) 100 MG tablet Take 100 mg by mouth daily.    [provider]  tamsulosin (FLOMAX) 0.4 MG CAPS capsule Take 0.4 mg by mouth.    [provider]  tiotropium (SPIRIVA) 18 MCG inhalation capsule Place 18 mcg into inhaler and  inhale daily. 07/16/16 07/16/17  [provider]    Allergies Patient has no known allergies.  No family history on file.  Social History Social History  Substance Use Topics  . Smoking status: Current Every Day Smoker    Packs/day: 0.50    Types: Cigarettes  . Smokeless tobacco: Never Used  . Alcohol use No    Review of Systems  Constitutional: No fever/chills Eyes: No visual changes. ENT: No sore throat. Cardiovascular: Denies chest pain. Respiratory: Denies shortness of breath. Gastrointestinal: No abdominal pain.  No nausea, no vomiting.  No diarrhea.  No constipation. Genitourinary: Negative for dysuria. Musculoskeletal:chronic, unchanged low back pain.  Skin: Negative for rash. Neurological: Negative for headaches, focal weakness or numbness.   ____________________________________________   PHYSICAL EXAM:  VITAL SIGNS: ED Triage Vitals  Enc Vitals Group     BP 02/26/17 1329 93/62     Pulse Rate 02/26/17 1329 100     Resp 02/26/17 1329 18     Temp 02/26/17 1329 98.9 F (37.2 C)     Temp Source 02/26/17 1329 Oral     SpO2 02/26/17 1329 93 %     Weight 02/26/17 1341 243 lb (110.2 kg)     Height 02/26/17 1341  (1.803 m)     Head Circumference --      Peak Flow --      Pain Score 02/26/17 1324 10     Pain Loc --      Pain Edu? --      Excl. in GC? --     Constitutional: Alert and oriented. Well appearing and in no acute distress. Eyes: Conjunctivae are normal.  Head: Atraumatic. Nose: No congestion/rhinnorhea. Mouth/Throat: Mucous membranes are moist.  Neck: No stridor.  Patient wearing soft cervical collar but when he has had neck freely. Cardiovascular: Normal rate, regular rhythm. Grossly normal heart sounds.   Respiratory: Normal respiratory effort.  No retractions. Lungs CTAB. Gastrointestinal: Soft and nontender. No distention. Musculoskeletal: No lower extremity tenderness nor edema.  No joint effusions. Neurologic:  Normal  speech and language. No gross focal neurologic deficits are appreciated. Skin:  Skin is warm, dry and intact. No rash noted. Psychiatric: Mood and affect are normal. Speech and behavior are normal.  ____________________________________________   LABS (all labs ordered are listed, but only abnormal results are displayed)  Labs Reviewed  CBC WITH DIFFERENTIAL/PLATELET - Abnormal; Notable for the following:       Result Value   RBC 4.32 (*)    Hemoglobin 12.8 (*)    HCT 37.6 (*)    All other components within normal limits  BASIC METABOLIC PANEL - Abnormal; Notable for the following:    CO2 21 (*)    Glucose, Bld 161 (*)    BUN 33 (*)    Creatinine, Ser 3.07 (*)    GFR  calc non Af Amer 21 (*)    GFR calc Af Amer 24 (*)    All other components within normal limits  TROPONIN I  URINE DRUG SCREEN, QUALITATIVE ([facility] ONLY)  URINALYSIS, COMPLETE ([facility]) WITH MICROSCOPIC   ____________________________________________  EKG  ED ECG REPORT I, Schaevitz,  Teena Irani, the attending physician, personally viewed and interpreted this ECG.   Date: 02/26/2017  EKG Time: 1340  Rate: 100  Rhythm: sinus tachycardia  Axis: Normal  Intervals:none  ST&T Change: no st segment elevation or depression.  No abnormal t wave inversion.    ____________________________________________  RADIOLOGY   ____________________________________________   PROCEDURES  Procedure(s) performed:   Procedures  Critical Care performed:   ____________________________________________   INITIAL IMPRESSION / ASSESSMENT AND PLAN / ED COURSE  Pertinent labs & imaging results that were available during my care of the patient were reviewed by me and considered in my medical decision making (see chart for details).  ----------------------------------------- 3:10 PM on 02/26/2017 -----------------------------------------  Patient found to be in acute renal failure. Very similar presentation to when he was  admitted this past July. To be admitted to the hospital at this time. Fluid hydration. Signed out to Dr. Elisabeth Pigeon. Patient is understanding the plan and willing to comply.    ____________________________________________   FINAL CLINICAL IMPRESSION(S) / ED DIAGNOSES  Near-syncope. Acute renal failure.     NEW MEDICATIONS STARTED DURING THIS VISIT:  New Prescriptions   No medications on file     Note:  This document was prepared using Dragon voice recognition software and may include unintentional dictation errors.     Myrna Blazer, MD 02/26/17 [phone]

## 2017-02-26 NOTE — Progress Notes (Signed)
Patient in room from ED. Wears a soft collar at home and states he is upset that it was taken off and he would like to know exactly who did it. This nurse stated that it was off when he arrived in the room. Told patient that it was probably removed when they did an ultrasound of his carotids before he came up to his room. Patient still demanding to know the exact person who removed it. Reassured patient that I would look into it. VSS. Resting quietly with call light in reach and bed in the lowest position with bed alarm on. Will continue to monitor.

## 2017-02-26 NOTE — Progress Notes (Signed)
Patient voided >400 ml dark amber urine, prior to I/O cath, refused cath. Will continue to monitor. Windy Carina, RN 02/26/2017 11:45 PM

## 2017-02-26 NOTE — ED Notes (Signed)
Resumed care from sonja rn.  Pt alert and eating crackers.  Iv team consult entered.  Pt has soft collar on neck .  Pt alert.  Speech clear.  Pt talking on cell phone

## 2017-02-26 NOTE — ED Notes (Signed)
Report given to Amy Coyne RN 

## 2017-02-26 NOTE — ED Notes (Signed)
Iv team in with pt and iv started.   Pt to u/s

## 2017-02-27 LAB — CBC
HEMATOCRIT: 32.9 % — AB (ref 40.0–52.0)
HEMOGLOBIN: 11.1 g/dL — AB (ref 13.0–18.0)
MCH: 29.2 pg (ref 26.0–34.0)
MCHC: 33.7 g/dL (ref 32.0–36.0)
MCV: 86.6 fL (ref 80.0–100.0)
Platelets: 196 10*3/uL (ref 150–440)
RBC: 3.8 MIL/uL — AB (ref 4.40–5.90)
RDW: 14.1 % (ref 11.5–14.5)
WBC: 5.3 10*3/uL (ref 3.8–10.6)

## 2017-02-27 LAB — URINE DRUG SCREEN, QUALITATIVE (ARMC ONLY)
Amphetamines, Ur Screen: NOT DETECTED
BARBITURATES, UR SCREEN: NOT DETECTED
Benzodiazepine, Ur Scrn: NOT DETECTED
CANNABINOID 50 NG, UR ~~LOC~~: NOT DETECTED
COCAINE METABOLITE, UR ~~LOC~~: POSITIVE — AB
MDMA (ECSTASY) UR SCREEN: NOT DETECTED
Methadone Scn, Ur: NOT DETECTED
Opiate, Ur Screen: NOT DETECTED
PHENCYCLIDINE (PCP) UR S: NOT DETECTED
TRICYCLIC, UR SCREEN: NOT DETECTED

## 2017-02-27 LAB — BASIC METABOLIC PANEL
Anion gap: 7 (ref 5–15)
BUN: 27 mg/dL — ABNORMAL HIGH (ref 6–20)
CHLORIDE: 106 mmol/L (ref 101–111)
CO2: 22 mmol/L (ref 22–32)
Calcium: 8.5 mg/dL — ABNORMAL LOW (ref 8.9–10.3)
Creatinine, Ser: 1.77 mg/dL — ABNORMAL HIGH (ref 0.61–1.24)
GFR calc non Af Amer: 41 mL/min — ABNORMAL LOW (ref >60)
GFR, EST AFRICAN AMERICAN: 47 mL/min — AB (ref >60)
Glucose, Bld: 201 mg/dL — ABNORMAL HIGH (ref 65–99)
POTASSIUM: 3.8 mmol/L (ref 3.5–5.1)
SODIUM: 135 mmol/L (ref 135–145)

## 2017-02-27 LAB — URINALYSIS, COMPLETE (UACMP) WITH MICROSCOPIC
BACTERIA UA: NONE SEEN
Bilirubin Urine: NEGATIVE
Glucose, UA: 50 mg/dL — AB
Hgb urine dipstick: NEGATIVE
Ketones, ur: NEGATIVE mg/dL
Leukocytes, UA: NEGATIVE
Nitrite: NEGATIVE
PROTEIN: NEGATIVE mg/dL
SPECIFIC GRAVITY, URINE: 1.013 (ref 1.005–1.030)
pH: 5 (ref 5.0–8.0)

## 2017-02-27 LAB — GLUCOSE, CAPILLARY
GLUCOSE-CAPILLARY: 232 mg/dL — AB (ref 65–99)
GLUCOSE-CAPILLARY: 197 mg/dL — AB (ref 65–99)
Glucose-Capillary: 155 mg/dL — ABNORMAL HIGH (ref 65–99)
Glucose-Capillary: 172 mg/dL — ABNORMAL HIGH (ref 65–99)

## 2017-02-27 NOTE — Progress Notes (Signed)
Called Dr. Anne Hahn regarding cpap order per patient request.  Appropriate orders were placed. Arturo Morton   02/27/2017  11:17 PM

## 2017-02-27 NOTE — Progress Notes (Signed)
[facility] [place] at [facility]   PATIENT NAME: Juan Harper    MR#:  [id number]  DATE OF BIRTH:  07-17-1960  SUBJECTIVE:  CHIEF COMPLAINT:   Chief Complaint  Patient presents with  . Near Syncope  Feeling dizzy.  Has a soft cervical collar on his neck.  Sleepy REVIEW OF SYSTEMS:  Review of Systems  Constitutional: Negative for chills, fever and weight loss.  HENT: Negative for nosebleeds and sore throat.   Eyes: Negative for blurred vision.  Respiratory: Negative for cough, shortness of breath and wheezing.   Cardiovascular: Negative for chest pain, orthopnea, leg swelling and PND.  Gastrointestinal: Negative for abdominal pain, constipation, diarrhea, heartburn, nausea and vomiting.  Genitourinary: Negative for dysuria and urgency.  Musculoskeletal: Negative for back pain.  Skin: Negative for rash.  Neurological: Positive for dizziness. Negative for speech change, focal weakness and headaches.  Endo/Heme/Allergies: Does not bruise/bleed easily.  Psychiatric/Behavioral: Negative for depression.   DRUG ALLERGIES:  No Known Allergies VITALS:  Blood pressure (!) 162/62, pulse 89, temperature 98.3 F (36.8 C), resp. rate 16, height  (1.803 m), weight 110.2 kg (243 lb), SpO2 98 %. PHYSICAL EXAMINATION:  Physical Exam  Constitutional: He is oriented to person, place, and time and well-developed, well-nourished, and in no distress.  HENT:  Head: Normocephalic and atraumatic.  Eyes: Pupils are equal, round, and reactive to light. Conjunctivae and EOM are normal.  Neck: Normal range of motion. Neck supple. No tracheal deviation present. No thyromegaly present.  Soft cervical collar on the neck  Cardiovascular: Normal rate, regular rhythm and normal heart sounds.   Pulmonary/Chest: Effort normal and breath sounds normal. No respiratory distress. He has no wheezes. He exhibits no tenderness.  Abdominal: Soft. Bowel sounds are normal. He exhibits no  distension. There is no tenderness.  Musculoskeletal: Normal range of motion.  Neurological: He is alert and oriented to person, place, and time. No cranial nerve deficit.  Skin: Skin is warm and dry. No rash noted.  Psychiatric: Mood and affect normal.   LABORATORY PANEL:  Male CBC  Recent Labs Lab 02/27/17 0458  WBC 5.3  HGB 11.1*  HCT 32.9*  PLT 196   ------------------------------------------------------------------------------------------------------------------ Chemistries   Recent Labs Lab 02/27/17 0458  NA 135  K 3.8  CL 106  CO2 22  GLUCOSE 201*  BUN 27*  CREATININE 1.77*  CALCIUM 8.5*   RADIOLOGY:  US Renal  Result Date: 02/26/2017 CLINICAL DATA:  Acute renal failure EXAM: RENAL / URINARY TRACT ULTRASOUND COMPLETE COMPARISON:  11/29/2015, 11/25/2016 FINDINGS: Right Kidney: Length: 11.8 cm. Echogenicity within normal limits. No mass or hydronephrosis visualized. Left Kidney: Length: 12.6 cm. Echogenicity within normal limits. No mass or hydronephrosis visualized. Bladder: Appears normal for degree of bladder distention. IMPRESSION: Negative renal ultrasound Electronically Signed   By: Jasmine Pang M.D.   On: 02/26/2017 17:34   US Carotid Bilateral  Result Date: 02/27/2017 CLINICAL DATA:  56 year old male with a history of TIA. Cardiovascular risk factors include hypertension, diabetes, tobacco use EXAM: BILATERAL CAROTID DUPLEX ULTRASOUND TECHNIQUE: Wallace Cullens scale imaging, color Doppler and duplex ultrasound were performed of bilateral carotid and vertebral arteries in the neck. COMPARISON:  No prior duplex FINDINGS: Criteria: Quantification of carotid stenosis is based on velocity parameters that correlate the residual internal carotid diameter with NASCET-based stenosis levels, using the diameter of the distal internal carotid lumen as the denominator for stenosis measurement. The following velocity measurements were obtained: RIGHT ICA:  Systolic 89 cm/sec,  Diastolic  17 cm/sec CCA:  78 cm/sec SYSTOLIC ICA/CCA RATIO:  1.1 ECA:  158 cm/sec LEFT ICA:  Systolic 98 cm/sec, Diastolic 31 cm/sec CCA:  132 cm/sec SYSTOLIC ICA/CCA RATIO:  0.7 ECA:  135 cm/sec Right Brachial SBP: Not acquired Left Brachial SBP: Not acquired RIGHT CAROTID ARTERY: No significant calcified disease of the right common carotid artery. Intermediate waveform maintained. Heterogeneous plaque without significant calcifications at the right carotid bifurcation. Low resistance waveform of the right ICA. No significant tortuosity. RIGHT VERTEBRAL ARTERY: Antegrade flow with low resistance waveform. LEFT CAROTID ARTERY: No significant calcified disease of the left common carotid artery. Intermediate waveform maintained. Heterogeneous plaque at the left carotid bifurcation without significant calcifications. Low resistance waveform of the left ICA. LEFT VERTEBRAL ARTERY:  Antegrade flow with low resistance waveform. IMPRESSION: Color duplex indicates minimal heterogeneous plaque, with no hemodynamically significant stenosis by duplex criteria in the extracranial cerebrovascular circulation. Signed, Yvone Neu. Loreta Ave, DO [facility] Radiology Electronically Signed   By: Gilmer Mor D.O.   On: 02/27/2017 07:11   ASSESSMENT AND PLAN:  56 y.o. male with a known history of BPH, Cervical spine fusion surgery April 2018, DM, Htn, Sleep apnea- Came with Dizziness and syncopal episodes. Also have some strong smell in urine  * Ac renal failure   Likely prerenal from dehydration   UA normal and Ur drug screen positive for cocaine  continue IV fluids, monitor.   Hold BP meds.  * Dizziness and syncope - His orthostatic vitals are positive.  - Hold Flomax  * DM   Hold oral meds, keep on ISS.  * Htn   Hold meds for now as BP is normal.  * COPD  No exacerbation signs.   Duoneb for now, Cont spiriva.  * Active smoking   Councelled to Quit smoking for 4 min  by admitting Dr.     All the records are reviewed and case discussed with Care Management/Social Worker. Management plans discussed with the patient, nursing and they are in agreement.  CODE STATUS: Full Code  TOTAL TIME TAKING CARE OF THIS PATIENT: 35 minutes.   More than 50% of the time was spent in counseling/coordination of care: YES  POSSIBLE D/C IN 1-2 DAYS, DEPENDING ON CLINICAL CONDITION.   Delfino Lovett M.D on 02/27/2017 at 3:47 PM  Between 7am to 6pm - Pager - [phone]  After 6pm go to [email or web address] - Social research officer, government  [facility]  Office  [phone]  CC: Primary care physician; System, Pcp Not In  Note: This dictation was prepared with Dragon dictation along with smaller phrase technology. Any transcriptional errors that result from this process are unintentional.

## 2017-02-27 NOTE — Progress Notes (Signed)
Orthostatic BPs:  Laying and sitting BP's in 120's.  Standing it increased to 162/62.

## 2017-02-28 LAB — GLUCOSE, CAPILLARY
GLUCOSE-CAPILLARY: 197 mg/dL — AB (ref 65–99)
Glucose-Capillary: 134 mg/dL — ABNORMAL HIGH (ref 65–99)
Glucose-Capillary: 180 mg/dL — ABNORMAL HIGH (ref 65–99)
Glucose-Capillary: 154 mg/dL — ABNORMAL HIGH (ref 65–99)

## 2017-02-28 LAB — BASIC METABOLIC PANEL
Anion gap: 6 (ref 5–15)
BUN: 14 mg/dL (ref 6–20)
CHLORIDE: 107 mmol/L (ref 101–111)
CO2: 23 mmol/L (ref 22–32)
CREATININE: 1.01 mg/dL (ref 0.61–1.24)
Calcium: 8.5 mg/dL — ABNORMAL LOW (ref 8.9–10.3)
GFR calc Af Amer: >60 mL/min (ref >60)
GFR calc non Af Amer: >60 mL/min (ref >60)
GLUCOSE: 197 mg/dL — AB (ref 65–99)
POTASSIUM: 4.5 mmol/L (ref 3.5–5.1)
SODIUM: 136 mmol/L (ref 135–145)

## 2017-02-28 MED ORDER — IPRATROPIUM-ALBUTEROL 0.5-2.5 (3) MG/3ML IN SOLN
3.0000 mL | Freq: Three times a day (TID) | RESPIRATORY_TRACT | Status: DC
  Administered 2017-02-28 (×2): 3 mL via RESPIRATORY_TRACT
  Filled 2017-02-28 (×2): qty 3

## 2017-02-28 MED ORDER — IPRATROPIUM-ALBUTEROL 0.5-2.5 (3) MG/3ML IN SOLN: 3.0000 mL | RESPIRATORY_TRACT | Status: DC | PRN

## 2017-02-28 NOTE — Progress Notes (Signed)
[facility] - [place] at [facility]   PATIENT NAME: Akito Boomhower    MR#:  [id number]  DATE OF BIRTH:  [birth year]  SUBJECTIVE:  CHIEF COMPLAINT:   Chief Complaint  Patient presents with  . Near Syncope   Dizziness is better but still feeling dizzy, sleepy REVIEW OF SYSTEMS:  Review of Systems  Constitutional: Negative for chills, fever and weight loss.  HENT: Negative for nosebleeds and sore throat.   Eyes: Negative for blurred vision.  Respiratory: Negative for cough, shortness of breath and wheezing.   Cardiovascular: Negative for chest pain, orthopnea, leg swelling and PND.  Gastrointestinal: Negative for abdominal pain, constipation, diarrhea, heartburn, nausea and vomiting.  Genitourinary: Negative for dysuria and urgency.  Musculoskeletal: Negative for back pain.  Skin: Negative for rash.  Neurological: Positive for dizziness. Negative for speech change, focal weakness and headaches.  Endo/Heme/Allergies: Does not bruise/bleed easily.  Psychiatric/Behavioral: Negative for depression.   DRUG ALLERGIES:  No Known Allergies VITALS:  Blood pressure 126/78, pulse 87, temperature 98.1 F (36.7 C), temperature source Oral, resp. rate 18, height  (1.803 m), weight 110.2 kg (243 lb), SpO2 97 %. PHYSICAL EXAMINATION:  Physical Exam  Constitutional: He is oriented to person, place, and time and well-developed, well-nourished, and in no distress.  HENT:  Head: Normocephalic and atraumatic.  Eyes: Pupils are equal, round, and reactive to light. Conjunctivae and EOM are normal.  Neck: Normal range of motion. Neck supple. No tracheal deviation present. No thyromegaly present.  Soft cervical collar on the neck  Cardiovascular: Normal rate, regular rhythm and normal heart sounds.   Pulmonary/Chest: Effort normal and breath sounds normal. No respiratory distress. He has no wheezes. He exhibits no tenderness.  Abdominal: Soft. Bowel sounds are normal. He exhibits  no distension. There is no tenderness.  Musculoskeletal: Normal range of motion.  Neurological: He is alert and oriented to person, place, and time. No cranial nerve deficit.  Skin: Skin is warm and dry. No rash noted.  Psychiatric: Mood and affect normal.   LABORATORY PANEL:  Male CBC  Recent Labs Lab 02/27/17 0458  WBC 5.3  HGB 11.1*  HCT 32.9*  PLT 196   ------------------------------------------------------------------------------------------------------------------ Chemistries   Recent Labs Lab 02/28/17 0913  NA 136  K 4.5  CL 107  CO2 23  GLUCOSE 197*  BUN 14  CREATININE 1.01  CALCIUM 8.5*   RADIOLOGY:  No results found. ASSESSMENT AND PLAN:  56 y.o. male with a known history of BPH, Cervical spine fusion surgery April 2018, DM, Htn, Sleep apnea- Came with Dizziness and syncopal episodes. Also have some strong smell in urine  * Ac renal failure   Likely prerenal from dehydration Continue IV fluids at 100 cc/h Renal function is improving, creatinine 3.07-1.77 baseline 1.19 monitor closely   UA normal and Ur drug screen positive for cocaine   avoid nephrotoxins    Hold BP meds.  * Dizziness and syncope - His orthostatic vitals are improving  - Hold Flomax  * DM   Hold oral meds, keep on ISS.  * Htn   Hold meds for now as BP is normal.  * COPD  No exacerbation signs.   Duoneb for now, Cont spiriva.  * Active smoking   Councelled to Quit smoking for 4 min by admitting Dr.     All the records are reviewed and case discussed with Care Management/Social Worker. Management plans discussed with the patient, nursing and they are in agreement.  CODE  STATUS: Full Code  TOTAL TIME TAKING CARE OF THIS PATIENT: 35 minutes.   More than 50% of the time was spent in counseling/coordination of care: YES  POSSIBLE D/C IN 1-2 DAYS, DEPENDING ON CLINICAL CONDITION.   Ramonita Lab M.D on 02/28/2017 at 1:12 PM  Between 7am to 6pm - Pager -  [phone]   After 6pm go to [email or web address] - Social research officer, government  [facility] [place] [facility]  Office  [phone]  CC: Primary care physician; System, Pcp Not In  Note: This dictation was prepared with Dragon dictation along with smaller phrase technology. Any transcriptional errors that result from this process are unintentional.

## 2017-03-01 LAB — BASIC METABOLIC PANEL
ANION GAP: 6 (ref 5–15)
BUN: 14 mg/dL (ref 6–20)
CHLORIDE: 107 mmol/L (ref 101–111)
CO2: 24 mmol/L (ref 22–32)
Calcium: 8.7 mg/dL — ABNORMAL LOW (ref 8.9–10.3)
Creatinine, Ser: 1.11 mg/dL (ref 0.61–1.24)
GFR calc non Af Amer: >60 mL/min (ref >60)
Glucose, Bld: 148 mg/dL — ABNORMAL HIGH (ref 65–99)
Potassium: 4.4 mmol/L (ref 3.5–5.1)
Sodium: 137 mmol/L (ref 135–145)

## 2017-03-01 LAB — GLUCOSE, CAPILLARY
GLUCOSE-CAPILLARY: 229 mg/dL — AB (ref 65–99)
Glucose-Capillary: 124 mg/dL — ABNORMAL HIGH (ref 65–99)

## 2017-03-01 NOTE — Discharge Instructions (Signed)
Follow-up with primary care physician or [facility] in 5 days

## 2017-03-01 NOTE — Progress Notes (Signed)
Patient discharged to home as ordered, patient instructed to make follow up appointment as ordered. IV discontinued site clean dry and intact. Patient is alert and oriented X4, no acute distress noted.

## 2017-03-01 NOTE — Evaluation (Signed)
Physical Therapy Evaluation Patient Details Name: Juan Harper MRN: [id number] DOB: 15-Mar-1961 Today's Date: 03/01/2017   History of Present Illness  Patient is a 56 y/o male that presents with episodes of syncope and dizziness. He had a c-spine fusion in April 2018 and has been wearing a soft c-collar since. He has had episodes of syncop/dizziness in the past. Positive urine drug screen.   Clinical Impression  Patient admitted for complaints of syncope and dizziness, no orthostasis noted in this session. He did not demonstrate any loss of balance, he does have chronic R knee pain and uses walking stick to offload. He occasionally leans excessively laterally, but otherwise no deficits observed in gait. He maintains c-collar on throughout session, and appears at roughly his recent baseline mobility. Would likely benefit from HHPT at discharge for increased LE strengthening and higher level balance work.     Follow Up Recommendations Home health PT    Equipment Recommendations       Recommendations for Other Services       Precautions / Restrictions Precautions Precautions: Fall Restrictions Weight Bearing Restrictions: No      Mobility  Bed Mobility Overal bed mobility: Needs Assistance Bed Mobility: Supine to Sit;Sit to Supine     Supine to sit: Min guard Sit to supine: Min guard   General bed mobility comments: No physical needs in transfers, prolonged and excessive use of UEs noted.   Transfers Overall transfer level: Needs assistance Equipment used: Straight cane Transfers: Sit to/from Stand Sit to Stand: Min guard         General transfer comment: Prolonged time to complete transfer, but no loss of balance noted.   Ambulation/Gait Ambulation/Gait assistance: Min guard Ambulation Distance (Feet): 200 Feet Assistive device: Straight cane Gait Pattern/deviations: WFL(Within Functional Limits)   Gait velocity interpretation: <1.8 ft/sec, indicative of risk for  recurrent falls General Gait Details: Patient uses a walking stick from home, has R knee sleeve secondary to   Stairs            Wheelchair Mobility    Modified Rankin (Stroke Patients Only)       Balance Overall balance assessment: History of Falls;Needs assistance Sitting-balance support: No upper extremity supported Sitting balance-Leahy Scale: Good     Standing balance support: Single extremity supported Standing balance-Leahy Scale: Good                               Pertinent Vitals/Pain Pain Assessment: Faces Faces Pain Scale: Hurts little more Pain Location: "all over"  Pain Descriptors / Indicators: Aching Pain Intervention(s): Limited activity within patient's tolerance;Monitored during session    Home Living Family/patient expects to be discharged to:: Private residence Living Arrangements: Alone Available Help at Discharge: Home health Type of Home: Apartment (Handicap accessible) Home Access: Level entry     Home Layout: One level Home Equipment: Walker - 2 wheels;Other (comment) (Walking stick)      Prior Function Level of Independence: Needs assistance   Gait / Transfers Assistance Needed: Has a RW at home, but has not been using it.   ADL's / Homemaking Assistance Needed: Per old note he has an aide that was coming to the house to assist with ADLs. He does not describe this during this session.        Hand Dominance        Extremity/Trunk Assessment   Upper Extremity Assessment Upper Extremity Assessment: Overall WFL for tasks  assessed    Lower Extremity Assessment Lower Extremity Assessment: Overall WFL for tasks assessed       Communication   Communication: No difficulties  Cognition Arousal/Alertness: Awake/alert Behavior During Therapy: WFL for tasks assessed/performed;Flat affect Overall Cognitive Status: Within Functional Limits for tasks assessed                                         General Comments      Exercises     Assessment/Plan    PT Assessment Patient needs continued PT services  PT Problem List Decreased strength;Pain;Decreased balance;Decreased mobility;Decreased activity tolerance       PT Treatment Interventions DME instruction;Functional mobility training;Gait training;Therapeutic activities;Stair training;Therapeutic exercise;Neuromuscular re-education;Balance training    PT Goals (Current goals can be found in the Care Plan section)  Acute Rehab PT Goals Patient Stated Goal: To return home  PT Goal Formulation: With patient Time For Goal Achievement: 03/15/17 Potential to Achieve Goals: Good    Frequency Min 2X/week   Barriers to discharge        Co-evaluation               AM-PAC PT "6 Clicks" Daily Activity  Outcome Measure Difficulty turning over in bed (including adjusting bedclothes, sheets and blankets)?: None Difficulty moving from lying on back to sitting on the side of the bed? : None Difficulty sitting down on and standing up from a chair with arms (e.g., wheelchair, bedside commode, etc,.)?: None Help needed moving to and from a bed to chair (including a wheelchair)?: A Little Help needed walking in hospital room?: A Little Help needed climbing 3-5 steps with a railing? : A Little 6 Click Score: 21    End of Session Equipment Utilized During Treatment: Gait belt Activity Tolerance: Patient tolerated treatment well Patient left: with call bell/phone within reach;in bed;with bed alarm set Nurse Communication: Mobility status PT Visit Diagnosis: Unsteadiness on feet (R26.81);Difficulty in walking, not elsewhere classified (R26.2)    Time: 1610-9604 PT Time Calculation (min) (ACUTE ONLY): 18 min   Charges:   PT Evaluation $PT Eval Moderate Complexity: 1 Mod     PT G Codes:   PT G-Codes **NOT FOR INPATIENT CLASS** Functional Assessment Tool Used: AM-PAC 6 Clicks Basic Mobility;Clinical judgement Functional  Limitation: Mobility: Walking and moving around Mobility: Walking and Moving Around Current Status (V4098): At least 1 percent but less than 20 percent impaired, limited or restricted Mobility: Walking and Moving Around Goal Status [phone]): At least 1 percent but less than 20 percent impaired, limited or restricted    Alva Garnet PT, DPT, CSCS    03/01/2017, 10:57 AM

## 2017-03-01 NOTE — Discharge Summary (Signed)
[facility] [place] [facility] [place]   PATIENT NAME: Juan Harper    MR#:  [id number]  DATE OF BIRTH:  26-Jul-1960  DATE OF ADMISSION:  02/26/2017 ADMITTING PHYSICIAN: Altamese Dilling, MD  DATE OF DISCHARGE:  03/01/17  PRIMARY CARE PHYSICIAN: System, Pcp Not In    ADMISSION DIAGNOSIS:  Cerebral infarction (HCC) [I63.9] CVA (cerebral infarction) [I63.9] Acute renal failure (ARF) (HCC) [N17.9] Near syncope [R55] Acute renal failure, unspecified acute renal failure type (HCC) [N17.9]  DISCHARGE DIAGNOSIS:  Principal Problem:   Acute on chronic renal failure (HCC) Active Problems:   Dizziness   Acute renal failure (ARF) (HCC)   SECONDARY DIAGNOSIS:   Past Medical History:  Diagnosis Date  . Arthritis    NECK/ RIGHT KNEE  . Bell's palsy   . BPH (benign prostatic hyperplasia)   . Cough    chronic  . Diabetes mellitus without complication (HCC)   . Edema    legs/feet  . 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 Hypertension   . Orthopnea   . Shortness of breath dyspnea   . Sleep apnea    cpap  . Wheezing     HOSPITAL COURSE:   HISTORY OF PRESENT ILLNESS: [name]  is a 56 y.o. male with a known history of BPH, Cervical spine fusion surgery April 2018, DM, Htn, Sleep apnea- Came with Dizziness and syncopal episodes. Also have some strong smell in urine. Denies using any meds or drugs other than his prescriptions.  Noted to have Ac renal failure in ER, BP stable. Feeling veery thirsty. Ur sample not yet collected for UA and drug screen. Given to hospitalist for Ac renal failure.   * Ac renal failure  prerenal from dehydration Continued IV fluids ,clinically improved discontinue IV fluids Renal function is improving, creatinine 3.07-1.77 --1.11 today baseline 1.19  UA normal and Ur drug screen positive for cocaine  avoid nephrotoxins   * Dizziness and syncope - His orthostatic vitals are improving  - Held  Flomax during the hospital course,resume the time of discharge  * DM resume home medications glipizide ,, metformin oral meds, patient was on sliding scale insulin during the hospital course  * Htn blood pressure meds held during the hospital course, renal function is back to normal. Resume lisinopril hydrochlorothiazide which is his home medication  * COPD No exacerbation signs. Duoneb for now, Cont spiriva.  * Active smoking Councelled to Quit smoking for 4 min by admitting Dr.   Marland Kitchenneck surgery with chronic dizziness Follows with [facility] surgeon Continue cervical collar  Physical therapy has recommended home health physical therapy  DISCHARGE CONDITIONS:   sta ble  CONSULTS OBTAINED:     PROCEDURES  None   DRUG ALLERGIES:  No Known Allergies  DISCHARGE MEDICATIONS:   Current Discharge Medication List    CONTINUE these medications which have NOT CHANGED   Details  albuterol (PROVENTIL HFA;VENTOLIN HFA) 108 (90 Base) MCG/ACT inhaler Inhale 2 puffs into the lungs every 6 (six) hours as needed for wheezing or shortness of breath.    aspirin EC 81 MG tablet Take 81 mg by mouth daily.    atorvastatin (LIPITOR) 40 MG tablet Take 40 mg by mouth daily.    budesonide-formoterol (SYMBICORT) 80-4.5 MCG/ACT inhaler Inhale 2 puffs into the lungs 2 (two) times daily.    diclofenac sodium (VOLTAREN) 1 % GEL Apply 2 g topically 2 (two) times daily.    gabapentin (NEURONTIN) 300  MG capsule Take 600 mg by mouth 2 (two) times daily. Can increase  three times a day if needed    glipiZIDE (GLUCOTROL) 5 MG tablet Take 1 tablet (5 mg total) by mouth 2 (two) times daily before a meal. Qty: 20 tablet, Refills: 0    ipratropium (ATROVENT HFA) 17 MCG/ACT inhaler Inhale into the lungs 2 (two) times daily.    lisinopril-hydrochlorothiazide (PRINZIDE,ZESTORETIC) 20-25 MG tablet Take 1 tablet by mouth daily.    loratadine (CLARITIN) 10 MG tablet Take 10 mg by mouth daily.     metFORMIN (GLUCOPHAGE) 500 MG tablet Take 2 tablets (1,000 mg total) by mouth 2 (two) times daily. Qty: 60 tablet, Refills: 0    sitaGLIPtin (JANUVIA) 100 MG tablet Take 100 mg by mouth daily.    tamsulosin (FLOMAX) 0.4 MG CAPS capsule Take 0.8 mg by mouth every morning.     tiotropium (SPIRIVA) 18 MCG inhalation capsule Place 18 mcg into inhaler and inhale daily.    acetaminophen (TYLENOL) 325 MG tablet Take 650 mg by mouth 3 (three) times daily.    oxycodone (OXY-IR) 5 MG capsule Take 5 mg by mouth every 6 (six) hours as needed.         DISCHARGE INSTRUCTIONS:   Follow-up with primary care physician or [facility] in 5 days   DIET:  Cardiac diet and Diabetic diet  DISCHARGE CONDITION:  Stable  ACTIVITY:  Activity as tolerated  OXYGEN:  Home Oxygen: No.   Oxygen Delivery: room air  DISCHARGE LOCATION:  home   If you experience worsening of your admission symptoms, develop shortness of breath, life threatening emergency, suicidal or homicidal thoughts you must seek medical attention immediately by calling 911 or calling your MD immediately  if symptoms less severe.  You Must read complete instructions/literature along with all the possible adverse reactions/side effects for all the Medicines you take and that have been prescribed to you. Take any new Medicines after you have completely understood and accpet all the possible adverse reactions/side effects.   Please note  You were cared for by a hospitalist during your hospital stay. If you have any questions about your discharge medications or the care you received while you were in the hospital after you are discharged, you can call the unit and asked to speak with the hospitalist on call if the hospitalist that took care of you is not available. Once you are discharged, your primary care physician will handle any further medical issues. Please note that NO REFILLS for any discharge medications will be  authorized once you are discharged, as it is imperative that you return to your primary care physician (or establish a relationship with a primary care physician if you do not have one) for your aftercare needs so that they can reassess your need for medications and monitor your lab values.     Today  Chief Complaint  Patient presents with  . Near Syncope   Patient is feeling better, had cervical surgery and wears cervical collar has chronic dizziness Feeling much better today worked with physical therapy. Comfortable to go home  ROS:  CONSTITUTIONAL: Denies fevers, chills. Denies any fatigue, weakness.  EYES: Denies blurry vision, double vision, eye pain. EARS, NOSE, THROAT: Denies tinnitus, ear pain, hearing loss. RESPIRATORY: Denies cough, wheeze, shortness of breath.  CARDIOVASCULAR: Denies chest pain, palpitations, edema.  GASTROINTESTINAL: Denies nausea, vomiting, diarrhea, abdominal pain. Denies bright red blood per rectum. GENITOURINARY: Denies dysuria, hematuria. ENDOCRINE: Denies nocturia or thyroid problems.  HEMATOLOGIC AND LYMPHATIC: Denies easy bruising or bleeding. SKIN: Denies rash or lesion. MUSCULOSKELETAL: Denies pain in neck, back, shoulder, knees, hips or arthritic symptoms.  NEUROLOGIC: Denies paralysis, paresthesias. Chronic dizziness from the neck surgery PSYCHIATRIC: Denies anxiety or depressive symptoms.   VITAL SIGNS:  Blood pressure (!) 142/89, pulse 77, temperature 99.1 F (37.3 C), temperature source Axillary, resp. rate 18, height  (1.803 m), weight 110.2 kg (243 lb), SpO2 97 %.  I/O:    Intake/Output Summary (Last 24 hours) at 03/01/17 1124 Last data filed at 03/01/17 1610  Gross per 24 hour  Intake              720 ml  Output             2625 ml  Net            -1905 ml    PHYSICAL EXAMINATION:  GENERAL:  56 y.o.-year-old patient lying in the bed with no acute distress.  EYES: Pupils equal, round, reactive to light and accommodation.  No scleral icterus. Extraocular muscles intact.  HEENT: Head atraumatic, normocephalic. Oropharynx and nasopharynx clear.  NECK:  Supple, no jugular venous distention. No thyroid enlargement, no tenderness. Cervical collar LUNGS: Normal breath sounds bilaterally, no wheezing, rales,rhonchi or crepitation. No use of accessory muscles of respiration.  CARDIOVASCULAR: S1, S2 normal. No murmurs, rubs, or gallops.  ABDOMEN: Soft, non-tender, non-distended. Bowel sounds present. No organomegaly or mass.  EXTREMITIES: No pedal edema, cyanosis, or clubbing.  NEUROLOGIC: Cranial nerves II through XII are intact. Muscle strength 5/5 in all extremities. Sensation intact. Gait not checked.  PSYCHIATRIC: The patient is alert and oriented x 3.  SKIN: No obvious rash, lesion, or ulcer.   DATA REVIEW:   CBC  Recent Labs Lab 02/27/17 0458  WBC 5.3  HGB 11.1*  HCT 32.9*  PLT 196    Chemistries   Recent Labs Lab 03/01/17 0404  NA 137  K 4.4  CL 107  CO2 24  GLUCOSE 148*  BUN 14  CREATININE 1.11  CALCIUM 8.7*    Cardiac Enzymes  Recent Labs Lab 02/26/17 1412  TROPONINI <0.03    Microbiology Results  No results found for this or any previous visit.  RADIOLOGY:  US Renal  Result Date: 02/26/2017 CLINICAL DATA:  Acute renal failure EXAM: RENAL / URINARY TRACT ULTRASOUND COMPLETE COMPARISON:  11/29/2015, 11/25/2016 FINDINGS: Right Kidney: Length: 11.8 cm. Echogenicity within normal limits. No mass or hydronephrosis visualized. Left Kidney: Length: 12.6 cm. Echogenicity within normal limits. No mass or hydronephrosis visualized. Bladder: Appears normal for degree of bladder distention. IMPRESSION: Negative renal ultrasound Electronically Signed   By: Jasmine Pang M.D.   On: 02/26/2017 17:34   US Carotid Bilateral  Result Date: 02/27/2017 CLINICAL DATA:  56 year old male with a history of TIA. Cardiovascular risk factors include hypertension, diabetes, tobacco use EXAM: BILATERAL  CAROTID DUPLEX ULTRASOUND TECHNIQUE: Wallace Cullens scale imaging, color Doppler and duplex ultrasound were performed of bilateral carotid and vertebral arteries in the neck. COMPARISON:  No prior duplex FINDINGS: Criteria: Quantification of carotid stenosis is based on velocity parameters that correlate the residual internal carotid diameter with NASCET-based stenosis levels, using the diameter of the distal internal carotid lumen as the denominator for stenosis measurement. The following velocity measurements were obtained: RIGHT ICA:  Systolic 89 cm/sec, Diastolic 17 cm/sec CCA:  78 cm/sec SYSTOLIC ICA/CCA RATIO:  1.1 ECA:  158 cm/sec LEFT ICA:  Systolic 98 cm/sec, Diastolic 31 cm/sec CCA:  132 cm/sec  SYSTOLIC ICA/CCA RATIO:  0.7 ECA:  135 cm/sec Right Brachial SBP: Not acquired Left Brachial SBP: Not acquired RIGHT CAROTID ARTERY: No significant calcified disease of the right common carotid artery. Intermediate waveform maintained. Heterogeneous plaque without significant calcifications at the right carotid bifurcation. Low resistance waveform of the right ICA. No significant tortuosity. RIGHT VERTEBRAL ARTERY: Antegrade flow with low resistance waveform. LEFT CAROTID ARTERY: No significant calcified disease of the left common carotid artery. Intermediate waveform maintained. Heterogeneous plaque at the left carotid bifurcation without significant calcifications. Low resistance waveform of the left ICA. LEFT VERTEBRAL ARTERY:  Antegrade flow with low resistance waveform. IMPRESSION: Color duplex indicates minimal heterogeneous plaque, with no hemodynamically significant stenosis by duplex criteria in the extracranial cerebrovascular circulation. Signed, Yvone Neu. Loreta Ave, DO [facility] Electronically Signed   By: Gilmer Mor D.O.   On: 02/27/2017 07:11    EKG:   Orders placed or performed during the hospital encounter of 02/26/17  . ED EKG  . ED EKG  .  EKG 12-Lead  . EKG 12-Lead      Management plans discussed with the patient, family and they are in agreement.  CODE STATUS:     Code Status Orders        Start     Ordered   02/26/17 1757  Full code  Continuous     02/26/17 1756    Code Status History    Date Active Date Inactive Code Status Order ID Comments User Context   11/24/2016 10:54 PM 11/26/2016  6:55 PM Full Code [id number]  Oralia Manis, MD Inpatient   09/19/2016 11:53 PM 09/21/2016  7:38 PM Full Code [id number]  Ramonita Lab, MD Inpatient      TOTAL TIME TAKING CARE OF THIS PATIENT: 45 minutes.   Note: This dictation was prepared with Dragon dictation along with smaller phrase technology. Any transcriptional errors that result from this process are unintentional.   @  on 03/01/2017 at 11:24 AM  Between 7am to 6pm - Pager - [phone]  After 6pm go to [email or web address] - password [email or web address]  [facility]  Office  [phone]  CC: Primary care physician; System, Pcp Not In

## 2018-01-06 DIAGNOSIS — E875 Hyperkalemia: Secondary | ICD-10-CM | POA: Insufficient documentation

## 2018-01-06 DIAGNOSIS — N179 Acute kidney failure, unspecified: Secondary | ICD-10-CM | POA: Insufficient documentation

## 2020-01-02 ENCOUNTER — Ambulatory Visit: Payer: Medicaid Other | Admitting: Podiatry

## 2020-01-05 ENCOUNTER — Ambulatory Visit: Payer: Medicaid Other | Admitting: Podiatry

## 2020-01-19 ENCOUNTER — Ambulatory Visit: Payer: Medicaid Other | Admitting: Podiatry

## 2020-03-14 ENCOUNTER — Encounter: Payer: Self-pay | Admitting: Surgery

## 2020-03-14 ENCOUNTER — Ambulatory Visit (INDEPENDENT_AMBULATORY_CARE_PROVIDER_SITE_OTHER): Payer: Medicaid Other | Admitting: Surgery

## 2020-03-14 ENCOUNTER — Other Ambulatory Visit: Payer: Self-pay

## 2020-03-14 VITALS — BP 119/75 | HR 92 | Temp 97.9°F | Resp 14 | Ht 70.0 in | Wt 235.6 lb

## 2020-03-14 DIAGNOSIS — M6208 Separation of muscle (nontraumatic), other site: Secondary | ICD-10-CM | POA: Diagnosis not present

## 2020-03-14 DIAGNOSIS — K429 Umbilical hernia without obstruction or gangrene: Secondary | ICD-10-CM | POA: Diagnosis not present

## 2020-03-14 DIAGNOSIS — K4020 Bilateral inguinal hernia, without obstruction or gangrene, not specified as recurrent: Secondary | ICD-10-CM | POA: Diagnosis not present

## 2020-03-14 NOTE — Progress Notes (Signed)
03/14/2020  Reason for Visit:  Abdominal hernia  Referring Provider:  Eda Paschal, NP  History of Present Illness: Juan Harper is a 59 y.o. male presenting for evaluation of abdominal hernia.  The patient describes it as a "small football" over his upper abdomen.  He reports that he's noticed this for about a year or more.  Reports some discomfort from the umbilical area to epigastric area.  Denies any other areas of pain or discomfort in the abdomen.  Reports some issues with nausea/vomiting, but does not seem related.  Denies any fevers, chills, chest pain.  Reports sometimes shortness of breath, he has OSA and uses CPAP at night.  Denies any diarrhea but does report having constipation, and it may be a week before a bowel movement.  He has diabetes and knows his A1c is high but is unsure of the more recent value.  He was being evaluated at [facility] for his hernia.  He had a CT scan of abdomen and pelvis on 06/21/18 which showed a small umbilical hernia and bilateral fat-containing inguinal hernias.  I have personally viewed the images and agree with the findings.  There was discussion about surgery, but was initially postpone due to A1c being high, and then also because of the COVID-19 pandemic.  Past Medical History: Past Medical History:  Diagnosis Date  . Arthritis    NECK/ RIGHT KNEE  . Bell's palsy   . BPH (benign prostatic hyperplasia)   . Cough    chronic  . Diabetes mellitus without complication ([place])   . Edema    legs/feet  . 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 Hypertension   . Orthopnea   . Shortness of breath dyspnea   . Sleep apnea    cpap  . Wheezing      Past Surgical History: Past Surgical History:  Procedure Laterality Date  . BACK SURGERY    . CATARACT EXTRACTION W/PHACO Right 07/31/2016   Procedure: CATARACT EXTRACTION PHACO AND INTRAOCULAR LENS PLACEMENT (IOC);  Surgeon: Eulogio Bear, MD;  Location: [facility] ORS;  Service:  Ophthalmology;  Laterality: Right;  Lot # [id number] H US:00:26.9 AP%:6.8% CDE: 1.83  . COLONOSCOPY    . NECK MASS EXCISION    . PILONIDAL CYST EXCISION      Home Medications: Prior to Admission medications   Medication Sig Start Date End Date Taking? Authorizing Provider  acetaminophen (TYLENOL) 325 MG tablet Take 650 mg by mouth 3 (three) times daily.    [provider]  albuterol (PROVENTIL HFA;VENTOLIN HFA) 108 (90 Base) MCG/ACT inhaler Inhale 2 puffs into the lungs every 6 (six) hours as needed for wheezing or shortness of breath.    [provider]  aspirin EC 81 MG tablet Take 81 mg by mouth daily.    [provider]  atorvastatin (LIPITOR) 40 MG tablet Take 40 mg by mouth daily.    [provider]  budesonide-formoterol (SYMBICORT) 80-4.5 MCG/ACT inhaler Inhale 2 puffs into the lungs 2 (two) times daily.    [provider]  diclofenac sodium (VOLTAREN) 1 % GEL Apply 2 g topically 2 (two) times daily.    [provider]  gabapentin (NEURONTIN) 300 MG capsule Take 600 mg by mouth 2 (two) times daily. Can increase 624m three times a day if needed    [provider]  glipiZIDE (GLUCOTROL) 5 MG tablet Take 1 tablet (5 mg total) by mouth 2 (two) times daily before a  meal. Patient taking differently: Take 15 mg by mouth daily.  09/05/16   Merlyn Lot, MD  ipratropium (ATROVENT HFA) 17 MCG/ACT inhaler Inhale into the lungs 2 (two) times daily.    [provider]  lisinopril-hydrochlorothiazide (PRINZIDE,ZESTORETIC) 20-25 MG tablet Take 1 tablet by mouth daily.    [provider]  loratadine (CLARITIN) 10 MG tablet Take 10 mg by mouth daily.    [provider]  metFORMIN (GLUCOPHAGE) 500 MG tablet Take 2 tablets (1,000 mg total) by mouth 2 (two) times daily. 09/05/16   Merlyn Lot, MD  oxycodone (OXY-IR) 5 MG capsule Take 5 mg by mouth every 6 (six) hours as needed.    [provider]   sitaGLIPtin (JANUVIA) 100 MG tablet Take 100 mg by mouth daily.    [provider]  tamsulosin (FLOMAX) 0.4 MG CAPS capsule Take 0.8 mg by mouth every morning.     [provider]  tiotropium (SPIRIVA) 18 MCG inhalation capsule Place 18 mcg into inhaler and inhale daily. 07/16/16 07/16/17  [provider]    Allergies: No Known Allergies  Social History:  reports that he has been smoking cigarettes. He has been smoking about 0.50 packs per day. He has never used smokeless tobacco. He reports current drug use. Drugs: Cocaine and Marijuana. He reports that he does not drink alcohol.   Family History: Family History  Problem Relation Age of Onset  . Hypertension Mother     Review of Systems: Review of Systems  Constitutional: Negative for chills and fever.  HENT: Negative for hearing loss.   Respiratory: Positive for shortness of breath.   Cardiovascular: Negative for chest pain.  Gastrointestinal: Positive for abdominal pain, constipation, nausea and vomiting. Negative for diarrhea.  Genitourinary: Negative for dysuria.  Musculoskeletal: Positive for joint pain. Negative for myalgias.  Skin: Negative for rash.  Neurological: Negative for dizziness.  Psychiatric/Behavioral: Negative for depression.    Physical Exam BP 119/75   Pulse 92   Temp 97.9 F (36.6 C)   Resp 14   Ht 5' 10"  (1.778 m)   Wt 235 lb 9.6 oz (106.9 kg)   SpO2 98%   BMI 33.81 kg/m  CONSTITUTIONAL: No acute distress HEENT:  Normocephalic, atraumatic, extraocular motion intact. NECK: Trachea is midline, and there is no jugular venous distension.  RESPIRATORY:  Normal respiratory effort without pathologic use of accessory muscles. CARDIOVASCULAR:  Regular rhythm and rate. GI: The abdomen is soft, obese, nondistended, nontender to palpation.  Patient has had a 5 to 6 cm diastases recti in the upper abdomen, a small 1 to 1.5 cm umbilical hernia defect which is reducible and nontender,  and bilateral reducible inguinal hernias which are also nontender.  MUSCULOSKELETAL:  Normal muscle strength and tone in all four extremities.  No peripheral edema or cyanosis. SKIN: Skin turgor is normal. There are no pathologic skin lesions.  NEUROLOGIC:  Motor and sensation is grossly normal.  Cranial nerves are grossly intact. PSYCH:  Alert and oriented to person, place and time. Affect is normal.  Laboratory Analysis: Labs from 01/25/2020: Sodium 137, potassium 4.8, chloride 101, carbon dioxide 25, BUN 22, creatinine 1.35, glucose 297.  LFTs within normal limits.  Cholesterol 117, less6.3. iron 64, TIBC 352.  TSH 0.584.  Uric acid 7.7.  WBC 7.2, hemoglobin 13.9, hematocrit 42, platelet 227.  ESR 58.  Imaging: CT scan abdomen pelvis on 06/21/2018: FINDINGS:   Evaluation of the solid organs and vasculature is limited in the absence of intravenous  contrast.   LOWER CHEST:   Linear opacity in the lingula, likely atelectasis or scarring, similar prior exam from 2017. Visualized heart size is normal.   ABDOMEN/PELVIS:   HEPATOBILIARY: Unremarkable liver. No biliary ductal dilatation. Gallbladder is unremarkable.  PANCREAS: Unremarkable.  SPLEEN: Unremarkable.  ADRENAL GLANDS: Unremarkable.  KIDNEYS/URETERS: No renal calculi or hydronephrosis. Mild bilateral perinephric stranding.  BLADDER: Unremarkable.  BOWEL/PERITONEUM/RETROPERITONEUM: Normal appendix. Colonic diverticulosis. No bowel obstruction. No acute inflammatory process. No ascites.  VASCULATURE: Abdominal aorta is normal in caliber. Unremarkable inferior vena cava.  LYMPH NODES: Prominent periportal and portacaval lymph node measuring up to 1.1 cm (3:39).  REPRODUCTIVE ORGANS: Enlarged prostate gland.   BONES/SOFT TISSUES: Bilateral fat-containing inguinal hernias. Small, fat-containing umbilical hernia with hernia neck measuring 1.3 cm. Degenerative changes in the spine.    Assessment and Plan: This is a 59 y.o. male with  bilateral inguinal hernias, umbilical hernia, and diastases recti.  -Discussed with patient that the upper abdominal area pain he feels bloating is truly not a hernia but a diastases recti where the muscles are separated more than usual.  This causes tenting of the underlying fascia because there is no longer muscle coverage in front of it.  However there is no truly a hernia defect.  He does instead have been umbilical hernia and bilateral inguinal hernias.  Based on these and how they appear than the CT scan from last year, I do not think that these will be related to nausea or vomiting issues that the patient has had from time to time but I did discuss with him that chronic constipation problems can definitely make his abdominal distention worse which could contribute to worsening of the hernias.  He does report that he used to do heavy lifting since many years ago as part of his work and I do think that this has also contributed to his hernias. -However there are a few things right now there are not ideal to try to proceed with surgery.  First, his obesity does put her at higher risk of complications as well as recurrence.  His diabetes also is not well controlled and this could also contribute to post op complications such as diminished wound healing or infections.  The patient reports that he has been trying to eat better and has lost a few pounds.  I applauded his efforts and I think it would be better if we follow-up with him in 3 more months where we can check his progress including weight loss and diabetes control.  If things are improving at that point, then we could schedule him for umbilical hernia and inguinal hernia repair. -Patient will follow-up in 3 months.  Face-to-face time spent with the patient and care providers was 60 minutes, with more than 50% of the time spent counseling, educating, and coordinating care of the patient.     Melvyn Neth, [facility]

## 2020-03-14 NOTE — Patient Instructions (Addendum)
Contact your PCP to recheck your A1C again. We will see you in 3 months to reevaluate your progress. Our office will contact you in December 2021 to schedule your follow up visit for January 2022. Call the office if you have any questions or concerns.   Hernia, Adult               A hernia happens when tissue inside your body pushes out through a weak spot in your belly muscles (abdominal wall). This makes a round lump (bulge). The lump may be:  In a scar from surgery that was done in your belly (incisional hernia).  Near your belly button (umbilical hernia).  In your groin (inguinal hernia). Your groin is the area where your leg meets your lower belly (abdomen). This kind of hernia could also be: ? In your scrotum, if you are male. ? In folds of skin around your vagina, if you are male.  In your upper thigh (femoral hernia).  Inside your belly (hiatal hernia). This happens when your stomach slides above the muscle between your belly and your chest (diaphragm). If your hernia is small and it does not cause pain, you may not need treatment. If your hernia is large or it causes pain, you may need surgery. Follow these instructions at home: Activity  Avoid stretching or overusing (straining) the muscles near your hernia. Straining can happen when you: ? Lift something heavy. ? Poop (have a bowel movement).  Do not lift anything that is heavier than 10 lb (4.5 kg), or the limit that you are told, until your doctor says that it is safe.  Use the strength of your legs when you lift something heavy. Do not use only your back muscles to lift. General instructions  Do these things if told by your doctor so you do not have trouble pooping (constipation): ? Drink enough fluid to keep your pee (urine) pale yellow. ? Eat foods that are high in fiber. These include fresh fruits and vegetables, whole grains, and beans. ? Limit foods that are high in fat and processed sugars. These  include foods that are fried or sweet. ? Take medicine for trouble pooping.  When you cough, try to cough gently.  You may try to push your hernia in by very gently pressing on it when you are lying down. Do not try to force the bulge back in if it will not push in easily.  If you are overweight, work with your doctor to lose weight safely.  Do not use any products that have nicotine or tobacco in them. These include cigarettes and e-cigarettes. If you need help quitting, ask your doctor.  If you will be having surgery (hernia repair), watch your hernia for changes in shape, size, or color. Tell your doctor if you see any changes.  Take over-the-counter and prescription medicines only as told by your doctor.  Keep all follow-up visits as told by your doctor. Contact a doctor if:  You get new pain, swelling, or redness near your hernia.  You poop fewer times in a week than normal.  You have trouble pooping.  You have poop (stool) that is more dry than normal.  You have poop that is harder or larger than normal. Get help right away if:  You have a fever.  You have belly pain that gets worse.  You feel sick to your stomach (nauseous).  You throw up (vomit).  Your hernia cannot be pushed in by very gently pressing  on it when you are lying down. Do not try to force the bulge back in if it will not push in easily.  Your hernia: ? Changes in shape or size. ? Changes color. ? Feels hard or it hurts when you touch it. These symptoms may represent a serious problem that is an emergency. Do not wait to see if the symptoms will go away. Get medical help right away. Call your local emergency services (911 in the [place] Summary  A hernia happens when tissue inside your body pushes out through a weak spot in the belly muscles. This creates a bulge.  If your hernia is small and it does not hurt, you may not need treatment. If your hernia is large or it hurts, you may need  surgery.  If you will be having surgery, watch your hernia for changes in shape, size, or color. Tell your doctor about any changes. This information is not intended to replace advice given to you by your health care provider. Make sure you discuss any questions you have with your health care provider. Document Revised: 09/02/2018 Document Reviewed: 02/11/2017 Elsevier Patient Education  2020 [employer].

## 2020-06-13 ENCOUNTER — Ambulatory Visit: Payer: Medicaid Other | Admitting: Surgery

## 2020-06-18 ENCOUNTER — Other Ambulatory Visit: Payer: Self-pay

## 2020-06-18 ENCOUNTER — Ambulatory Visit (INDEPENDENT_AMBULATORY_CARE_PROVIDER_SITE_OTHER): Payer: Medicaid Other | Admitting: Surgery

## 2020-06-18 ENCOUNTER — Encounter: Payer: Self-pay | Admitting: Surgery

## 2020-06-18 VITALS — BP 113/69 | HR 88 | Temp 98.5°F | Ht 71.0 in | Wt 231.4 lb

## 2020-06-18 DIAGNOSIS — K429 Umbilical hernia without obstruction or gangrene: Secondary | ICD-10-CM | POA: Diagnosis not present

## 2020-06-18 DIAGNOSIS — K4020 Bilateral inguinal hernia, without obstruction or gangrene, not specified as recurrent: Secondary | ICD-10-CM

## 2020-06-18 NOTE — Progress Notes (Signed)
06/18/2020  History of Present Illness: Juan Harper is a 60 y.o. male presenting for follow-up of bilateral inguinal hernias as well as umbilical hernia.  Was last seen on 03/14/2020 during which time the decided to have him follow-up in 3 months because his most recent A1c was 8.1 which was done in January 2020.  Today, he reports that the hernias have not gotten worse as far symptoms no but he does mention that his glucose control has not been good.  He reports that he had initially done well and his blood glucose had gone as low as 190 but more recently it has worsened and now is back to the 200s to 300s.  After contacting his PCP, we discovered that his most recent A1c last month was around 11.  However the patient has been asking [place] about what things he can do to improve his glucose control.  He does mention that his blood pressure has been better and also that he quit smoking 1 week ago and over the past week, he has not had any cravings for cigarettes.  Past Medical History: Past Medical History:  Diagnosis Date  . Arthritis    NECK/ RIGHT KNEE  . Bell's palsy   . BPH (benign prostatic hyperplasia)   . Cough    chronic  . Diabetes mellitus without complication ([facility])   . Edema    legs/feet  . 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 Hypertension   . Orthopnea   . Shortness of breath dyspnea   . Sleep apnea    cpap  . Wheezing      Past Surgical History: Past Surgical History:  Procedure Laterality Date  . BACK SURGERY    . CATARACT EXTRACTION W/PHACO Right 07/31/2016   Procedure: CATARACT EXTRACTION PHACO AND INTRAOCULAR LENS PLACEMENT (IOC);  Surgeon: Nevada Crane, MD;  Location: [facility] ORS;  Service: Ophthalmology;  Laterality: Right;  Lot # [id number] H US:00:26.9 AP%:6.8% CDE: 1.83  . COLONOSCOPY    . NECK MASS EXCISION    . PILONIDAL CYST EXCISION      Home Medications: Prior to Admission medications   Medication Sig Start Date End Date Taking?  Authorizing Provider  acetaminophen (TYLENOL) 325 MG tablet Take 650 mg by mouth 3 (three) times daily.   Yes [provider]  albuterol (PROVENTIL HFA;VENTOLIN HFA) 108 (90 Base) MCG/ACT inhaler Inhale 2 puffs into the lungs every 6 (six) hours as needed for wheezing or shortness of breath.   Yes [provider]  allopurinol (ZYLOPRIM) 100 MG tablet Take 100 mg by mouth daily. 03/01/20  Yes [provider]  amLODipine (NORVASC) 10 MG tablet Take 10 mg by mouth daily. 03/01/20  Yes [provider]  aspirin EC 81 MG tablet Take 81 mg by mouth daily.   Yes [provider]  atorvastatin (LIPITOR) 40 MG tablet Take 40 mg by mouth daily.   Yes [provider]  budesonide-formoterol (SYMBICORT) 80-4.5 MCG/ACT inhaler Inhale 2 puffs into the lungs 2 (two) times daily.   Yes [provider]  chlorthalidone (HYGROTON) 25 MG tablet Take 25 mg by mouth daily. 03/01/20  Yes [provider]  diclofenac sodium (VOLTAREN) 1 % GEL Apply 2 g topically 2 (two) times daily.   Yes [provider]  finasteride (PROSCAR) 5 MG tablet Take 5 mg by mouth daily. 03/01/20  Yes [provider]  glipiZIDE (GLUCOTROL) 5 MG tablet Take 1 tablet (5 mg  total) by mouth 2 (two) times daily before a meal. Patient taking differently: Take 15 mg by mouth daily. 09/05/16  Yes Willy Eddy, MD  insulin detemir (LEVEMIR FLEXTOUCH) 100 UNIT/ML FlexPen  04/01/19  Yes [provider]  insulin detemir (LEVEMIR) 100 UNIT/ML injection Inject into the skin.   Yes [provider]  ipratropium (ATROVENT HFA) 17 MCG/ACT inhaler Inhale into the lungs 2 (two) times daily.   Yes [provider]  loratadine (CLARITIN) 10 MG tablet Take 10 mg by mouth daily.   Yes [provider]  Melatonin 5 MG CAPS Take by mouth.   Yes [provider]  metFORMIN (GLUCOPHAGE) 500 MG tablet Take 2 tablets (1,000 mg total) by mouth 2 (two)  times daily. 09/05/16  Yes Willy Eddy, MD  oxycodone (OXY-IR) 5 MG capsule Take 5 mg by mouth every 6 (six) hours as needed.   Yes [provider]  potassium chloride (KLOR-CON) 10 MEQ tablet Take 10 mEq by mouth daily. 01/17/20  Yes [provider]  sitaGLIPtin (JANUVIA) 100 MG tablet Take 100 mg by mouth daily.   Yes [provider]  tamsulosin (FLOMAX) 0.4 MG CAPS capsule Take 0.8 mg by mouth every morning.    Yes [provider]  traZODone (DESYREL) 50 MG tablet  03/01/20  Yes [provider]  YUPELRI 175 MCG/3ML nebulizer solution 175 mcg daily. 10/31/19  Yes [provider]  lisinopril (ZESTRIL) 20 MG tablet Take by mouth. 04/12/19 04/11/20  [provider]  tiotropium (SPIRIVA) 18 MCG inhalation capsule Place 18 mcg into inhaler and inhale daily. 07/16/16 07/16/17  [provider]    Allergies: No Known Allergies  Review of Systems: Review of Systems  Constitutional: Negative for chills and fever.  Respiratory: Negative for shortness of breath.   Cardiovascular: Negative for chest pain.  Gastrointestinal: Negative for abdominal pain, nausea and vomiting.    Physical Exam BP 113/69   Pulse 88   Temp 98.5 F (36.9 C) (Oral)   Ht 5\' 11"  (1.803 m)   Wt 231 lb 6.4 oz (105 kg)   SpO2 94%   BMI 32.27 kg/m  CONSTITUTIONAL: No acute distress HEENT:  Normocephalic, atraumatic, extraocular motion intact. RESPIRATORY:  Normal respiratory effort without pathologic use of accessory muscles. CARDIOVASCULAR: Regular rhythm and rate. GI: The abdomen is soft, obese, nondistended.  Patient has reducible bilateral inguinal hernias and umbilical hernia as well as diastases recti of the upper abdomen.  NEUROLOGIC:  Motor and sensation is grossly normal.  Cranial nerves are grossly intact. PSYCH:  Alert and oriented to person, place and time. Affect is normal.   Assessment and Plan: This is a 60 y.o. male with bilateral  inguinal hernias and umbilical hernia.  -Our office was able to contact his PCPs office and found that his most recent A1c last month was around 11.  This is very high and would not be amenable to trying to surgery anytime soon.  I encouraged the patient to start doing better with his diet control and will give him information on what types of foods he should avoid and what types of foods are better to eat.  He does say that he checks his blood sugar 4 times a day.  I think if that is the case, then it may be that his diabetes regimen has to be adjusted.  We were able to schedule him with his PCP for an appointment tomorrow for follow-up of his diabetes to hopefully be able to  get better glucose control.  Discussed with patient that this is imperative prior to surgery in order to give [place] better chances for good wound healing, without complications, without an increased risk of hernia recurrence or infections. -Commended the patient on quitting smoking as this will only help him both from the respiratory standpoint as well as healing standpoint.  Encouraged him to continue walking and slowly increase his activity level.  Weight loss can only help as well with diabetes management, blood pressure management, and decrease any potential complications from surgery. -Patient will follow-up with me in 3 more months to see how his progress has been doing.  Face-to-face time spent with the patient and care providers was 25 minutes, with more than 50% of the time spent counseling, educating, and coordinating care of the patient.     Howie Ill, MD [facility]

## 2020-06-18 NOTE — Patient Instructions (Addendum)
We will call you in March 2022 to schedule a follow up with Dr.Piscoya in April. Please call our office if you have not heard from our office by the end of March.    Please be sure to follow through with all your doctors appointments. We need you to have your hemoglobin A1C done. You have an appointment scheduled with Franco NonesCheryl Lindley 06/19/2020 @ 1:45 pm.    Please work on better control of your blood sugars. This will help with wound healing when you have your surgery done. Try removing sugars and carbohydrates from your daily diet until you have completely weaned yourself off them.    Diabetes Mellitus and Nutrition, Adult When you have diabetes, or diabetes mellitus, it is very important to have healthy eating habits because your blood sugar (glucose) levels are greatly affected by what you eat and drink. Eating healthy foods in the right amounts, at about the same times every day, can help you:  Control your blood glucose.  Lower your risk of heart disease.  Improve your blood pressure.  Reach or maintain a healthy weight. What can affect my meal plan? Every person with diabetes is different, and each person has different needs for a meal plan. Your health care provider may recommend that you work with a dietitian to make a meal plan that is best for you. Your meal plan may vary depending on factors such as:  The calories you need.  The medicines you take.  Your weight.  Your blood glucose, blood pressure, and cholesterol levels.  Your activity level.  Other health conditions you have, such as heart or kidney disease. How do carbohydrates affect me? Carbohydrates, also called carbs, affect your blood glucose level more than any other type of food. Eating carbs naturally raises the amount of glucose in your blood. Carb counting is a method for keeping track of how many carbs you eat. Counting carbs is important to keep your blood glucose at a healthy level, especially if you use  insulin or take certain oral diabetes medicines. It is important to know how many carbs you can safely have in each meal. This is different for every person. Your dietitian can help you calculate how many carbs you should have at each meal and for each snack. How does alcohol affect me? Alcohol can cause a sudden decrease in blood glucose (hypoglycemia), especially if you use insulin or take certain oral diabetes medicines. Hypoglycemia can be a life-threatening condition. Symptoms of hypoglycemia, such as sleepiness, dizziness, and confusion, are similar to symptoms of having too much alcohol.  Do not drink alcohol if: ? Your health care provider tells you not to drink. ? You are pregnant, may be pregnant, or are planning to become pregnant.  If you drink alcohol: ? Do not drink on an empty stomach. ? Limit how much you use to:  0-1 drink a day for women.  0-2 drinks a day for men. ? Be aware of how much alcohol is in your drink. In the [place] one drink equals one 12 oz bottle of beer (355 mL), one 5 oz glass of wine (148 mL), or one 1 oz glass of hard liquor (44 mL). ? Keep yourself hydrated with water, diet soda, or unsweetened iced tea.  Keep in mind that regular soda, juice, and other mixers may contain a lot of sugar and must be counted as carbs. What are tips for following this plan? Reading food labels  Start by checking the serving size  on the "Nutrition Facts" label of packaged foods and drinks. The amount of calories, carbs, fats, and other nutrients listed on the label is based on one serving of the item. Many items contain more than one serving per package.  Check the total grams (g) of carbs in one serving. You can calculate the number of servings of carbs in one serving by dividing the total carbs by 15. For example, if a food has 30 g of total carbs per serving, it would be equal to 2 servings of carbs.  Check the number of grams (g) of saturated fats and trans fats in one  serving. Choose foods that have a low amount or none of these fats.  Check the number of milligrams (mg) of salt (sodium) in one serving. Most people should limit total sodium intake to less than 2,300 mg per day.  Always check the nutrition information of foods labeled as "low-fat" or "nonfat." These foods may be higher in added sugar or refined carbs and should be avoided.  Talk to your dietitian to identify your daily goals for nutrients listed on the label. Shopping  Avoid buying canned, pre-made, or processed foods. These foods tend to be high in fat, sodium, and added sugar.  Shop around the outside edge of the grocery store. This is where you will most often find fresh fruits and vegetables, bulk grains, fresh meats, and fresh dairy. Cooking  Use low-heat cooking methods, such as baking, instead of high-heat cooking methods like deep frying.  Cook using healthy oils, such as olive, canola, or sunflower oil.  Avoid cooking with butter, cream, or high-fat meats. Meal planning  Eat meals and snacks regularly, preferably at the same times every day. Avoid going long periods of time without eating.  Eat foods that are high in fiber, such as fresh fruits, vegetables, beans, and whole grains. Talk with your dietitian about how many servings of carbs you can eat at each meal.  Eat 4-6 oz (112-168 g) of lean protein each day, such as lean meat, chicken, fish, eggs, or tofu. One ounce (oz) of lean protein is equal to: ? 1 oz (28 g) of meat, chicken, or fish. ? 1 egg. ?  cup (62 g) of tofu.  Eat some foods each day that contain healthy fats, such as avocado, nuts, seeds, and fish.   What foods should I eat? Fruits Berries. Apples. Oranges. Peaches. Apricots. Plums. Grapes. Mango. Papaya. Pomegranate. Kiwi. Cherries. Vegetables Lettuce. Spinach. Leafy greens, including kale, chard, collard greens, and mustard greens. Beets. Cauliflower. Cabbage. Broccoli. Carrots. Green beans.  Tomatoes. Peppers. Onions. Cucumbers. Brussels sprouts. Grains Whole grains, such as whole-wheat or whole-grain bread, crackers, tortillas, cereal, and pasta. Unsweetened oatmeal. Quinoa. Brown or wild rice. Meats and other proteins Seafood. Poultry without skin. Lean cuts of poultry and beef. Tofu. Nuts. Seeds. Dairy Low-fat or fat-free dairy products such as milk, yogurt, and cheese. The items listed above may not be a complete list of foods and beverages you can eat. Contact a dietitian for more information. What foods should I avoid? Fruits Fruits canned with syrup. Vegetables Canned vegetables. Frozen vegetables with butter or cream sauce. Grains Refined white flour and flour products such as bread, pasta, snack foods, and cereals. Avoid all processed foods. Meats and other proteins Fatty cuts of meat. Poultry with skin. Breaded or fried meats. Processed meat. Avoid saturated fats. Dairy Full-fat yogurt, cheese, or milk. Beverages Sweetened drinks, such as soda or iced tea. The items listed above  may not be a complete list of foods and beverages you should avoid. Contact a dietitian for more information. Questions to ask a health care provider  Do I need to meet with a diabetes educator?  Do I need to meet with a dietitian?  What number can I call if I have questions?  When are the best times to check my blood glucose? Where to find more information:  American Diabetes Association: diabetes.org  Academy of Nutrition and Dietetics: www.eatright.[employer] of Diabetes and Digestive and Kidney Diseases: CarFlippers.tn  Association of Diabetes Care and Education Specialists: www.diabeteseducator.org Summary  It is important to have healthy eating habits because your blood sugar (glucose) levels are greatly affected by what you eat and drink.  A healthy meal plan will help you control your blood glucose and maintain a healthy lifestyle.  Your health care  provider may recommend that you work with a dietitian to make a meal plan that is best for you.  Keep in mind that carbohydrates (carbs) and alcohol have immediate effects on your blood glucose levels. It is important to count carbs and to use alcohol carefully. This information is not intended to replace advice given to you by your health care provider. Make sure you discuss any questions you have with your health care provider. Document Revised: 04/19/2019 Document Reviewed: 04/19/2019 Elsevier Patient Education  2021 [employer].  yourself off them.    Diabetes Mellitus and Exercise Exercising regularly is important for overall health, especially for people who have diabetes mellitus. Exercising is not only about losing weight. It has many other health benefits, such as increasing muscle strength and bone density and reducing body fat and stress. This leads to improved fitness, flexibility, and endurance, all of which result in better overall health. What are the benefits of exercise if I have diabetes? Exercise has many benefits for people with diabetes. They include:  Helping to lower and control blood sugar (glucose).  Helping the body to respond better to the hormone insulin by improving insulin sensitivity.  Reducing how much insulin the body needs.  Lowering the risk for heart disease by: ? Lowering "bad" cholesterol and triglyceride levels. ? Increasing "good" cholesterol levels. ? Lowering blood pressure. ? Lowering blood glucose levels. What is my activity plan? Your health care provider or certified diabetes educator can help you make a plan for the type and frequency of exercise that works for you. This is called your activity plan. Be sure to:  Get at least 150 minutes of medium-intensity or high-intensity exercise each week. Exercises may include brisk walking, biking, or water aerobics.  Do stretching and strengthening exercises, such as yoga or weight lifting, at  least 2 times a week.  Spread out your activity over at least 3 days of the week.  Get some form of physical activity each day. ? Do not go more than 2 days in a row without some kind of physical activity. ? Avoid being inactive for more than 90 minutes at a time. Take frequent breaks to walk or stretch.  Choose exercises or activities that you enjoy. Set realistic goals.  Start slowly and gradually increase your exercise intensity over time.   How do I manage my diabetes during exercise? Monitor your blood glucose  Check your blood glucose before and after exercising. If your blood glucose is: ? 240 mg/dL (16.1 mmol/L) or higher before you exercise, check your urine for ketones. These are chemicals created by the liver. If you  have ketones in your urine, do not exercise until your blood glucose returns to normal. ? 100 mg/dL (5.6 mmol/L) or lower, eat a snack containing 15-20 grams of carbohydrate. Check your blood glucose 15 minutes after the snack to make sure that your glucose level is above 100 mg/dL (5.6 mmol/L) before you start your exercise.  Know the symptoms of low blood glucose (hypoglycemia) and how to treat it. Your risk for hypoglycemia increases during and after exercise. Follow these tips and your health care provider's instructions  Keep a carbohydrate snack that is fast-acting for use before, during, and after exercise to help prevent or treat hypoglycemia.  Avoid injecting insulin into areas of the body that are going to be exercised. For example, avoid injecting insulin into: ? Your arms, when you are about to play tennis. ? Your legs, when you are about to go jogging.  Keep records of your exercise habits. Doing this can help you and your health care provider adjust your diabetes management plan as needed. Write down: ? Food that you eat before and after you exercise. ? Blood glucose levels before and after you exercise. ? The type and amount of exercise you have  done.  Work with your health care provider when you start a new exercise or activity. He or she may need to: ? Make sure that the activity is safe for you. ? Adjust your insulin, other medicines, and food that you eat.  Drink plenty of water while you exercise. This prevents loss of water (dehydration) and problems caused by a lot of heat in the body (heat stroke).   Where to find more information  American Diabetes Association: www.diabetes.org Summary  Exercising regularly is important for overall health, especially for people who have diabetes mellitus.  Exercising has many health benefits. It increases muscle strength and bone density and reduces body fat and stress. It also lowers and controls blood glucose.  Your health care provider or certified diabetes educator can help you make an activity plan for the type and frequency of exercise that works for you.  Work with your health care provider to make sure any new activity is safe for you. Also work with your health care provider to adjust your insulin, other medicines, and the food you eat. This information is not intended to replace advice given to you by your health care provider. Make sure you discuss any questions you have with your health care provider. Document Revised: 02/07/2019 Document Reviewed: 02/07/2019 Elsevier Patient Education  2021 [employer].

## 2020-07-26 ENCOUNTER — Ambulatory Visit: Payer: Medicaid Other | Admitting: *Deleted

## 2020-08-09 ENCOUNTER — Ambulatory Visit: Payer: Medicaid Other | Admitting: *Deleted

## 2020-08-21 ENCOUNTER — Other Ambulatory Visit: Payer: Self-pay

## 2020-08-21 ENCOUNTER — Encounter: Payer: Medicaid Other | Attending: Family Medicine | Admitting: *Deleted

## 2020-08-21 ENCOUNTER — Encounter: Payer: Self-pay | Admitting: *Deleted

## 2020-08-21 VITALS — BP 120/64 | Ht 70.0 in | Wt 233.0 lb

## 2020-08-21 DIAGNOSIS — E1165 Type 2 diabetes mellitus with hyperglycemia: Secondary | ICD-10-CM

## 2020-08-21 DIAGNOSIS — E119 Type 2 diabetes mellitus without complications: Secondary | ICD-10-CM | POA: Diagnosis present

## 2020-08-21 NOTE — Patient Instructions (Signed)
Check blood sugars 4 x day before each meal and before bed every day Bring blood sugar records to the next appointment  Exercise:  Walk as tolerated  Eat 3 meals day,  2  snacks a day Space meals 4-6 hours apart Limit desserts and sweets  Quit smoking  Carry fast acting glucose and a snack at all times Rotate injection sites  Return for appointment on:

## 2020-08-21 NOTE — Progress Notes (Signed)
Diabetes Self-Management Education  3:00 pm - Left a voicemail message with PCP office regarding insulin. Pt is taking Levemir as sliding scale and at bedtime. Spoke with his pharmacy and he hasn't picked up NovoLog pens since Dec.   Visit Type: First/Initial  Appt. Start Time: 1320 Appt. End Time: 1450  08/21/2020  [name], identified by name and date of birth, is a 60 y.o. male with a diagnosis of Diabetes: Type 2.   ASSESSMENT  Blood pressure 120/64, height 5\' 10"  (1.778 m), weight 233 lb (105.7 kg). Body mass index is 33.43 kg/m.   Diabetes Self-Management Education - 08/21/20 1540      Visit Information   Visit Type First/Initial      Initial Visit   Diabetes Type Type 2    Are you currently following a meal plan? Yes    What type of meal plan do you follow? "no butter, less carbs, no fried foods"    Are you taking your medications as prescribed? Yes   Confusion on Levemir and NovoLog. Pt is using Lememir pen 4 x day (as slidiing scale and at bedtime) and not NovoLog. Per PCP note from Dec 2021 he is supposed to be on NovoLog using sliding scale. Levemir is not in PCP note. Left vm with PCP to clarify.   Date Diagnosed 10 yrs ago      Health Coping   How would you rate your overall health? Poor      Psychosocial Assessment   Patient Belief/Attitude about Diabetes Defeat/Burnout   "emotional, tired"   Self-care barriers None    Self-management support Doctor's office;Friends;Family    Patient Concerns Nutrition/Meal planning;Medication;Monitoring;Healthy Lifestyle;Problem Solving;Glycemic Control;Weight Control    Special Needs None    Preferred Learning Style Auditory    Learning Readiness Change in progress    How often do you need to have someone help you when you read instructions, pamphlets, or other written materials from your doctor or pharmacy? 3 - Sometimes    What is the last grade level you completed in school? 9th      Pre-Education Assessment    Patient understands the diabetes disease and treatment process. Needs Review    Patient understands incorporating nutritional management into lifestyle. Needs Instruction    Patient undertands incorporating physical activity into lifestyle. Needs Instruction    Patient understands using medications safely. Needs Instruction    Patient understands monitoring blood glucose, interpreting and using results Needs Review    Patient understands prevention, detection, and treatment of acute complications. Needs Instruction    Patient understands prevention, detection, and treatment of chronic complications. Needs Instruction    Patient understands how to develop strategies to address psychosocial issues. Needs Instruction    Patient understands how to develop strategies to promote health/change behavior. Needs Instruction      Complications   Last HgB A1C per patient/outside source 8.8 %   06/2020 - was 11.9% on 04/2020   How often do you check your blood sugar? > 4 times/day   didn't bring meter or records   Fasting Blood glucose range (mg/dL) 05/2020   Pt reports FBG's 130-170's mg/dL.   Postprandial Blood glucose range (mg/dL) --   Pt reports readings before meals and bedtime 180-200's mg/dL.   Have you had a dilated eye exam in the past 12 months? Yes    Have you had a dental exam in the past 12 months? No    Are you checking your feet? No  Dietary Intake   Breakfast cereal and milk; eggs and bacon, toast    Snack (morning) 3 snacks day - fruit (orange, apple) also desserts/sweets    Lunch ham sandwich; chicken and brown rice casserole    Dinner chicken, occasional beef, pork, fish; potatoes weekly, peas, beans, corn, rice, cabbage, broccoli, salads, lettuce tomatoe    Beverage(s) water, cofffee with Sweet-n-low, diet soda      Exercise   Exercise Type ADL's      Patient Education   Previous Diabetes Education No    Disease state  Definition of diabetes, type 1 and 2, and the diagnosis  of diabetes;Explored patient's options for treatment of their diabetes    Nutrition management  Role of diet in the treatment of diabetes and the relationship between the three main macronutrients and blood glucose level;Food label reading, portion sizes and measuring food.;Reviewed blood glucose goals for pre and post meals and how to evaluate the patients' food intake on their blood glucose level.;Meal timing in regards to the patients' current diabetes medication.    Physical activity and exercise  Role of exercise on diabetes management, blood pressure control and cardiac health.    Medications Taught/reviewed insulin injection, site rotation, insulin storage and needle disposal.;Reviewed patients medication for diabetes, action, purpose, timing of dose and side effects.    Monitoring Purpose and frequency of SMBG.;Taught/discussed recording of test results and interpretation of SMBG.;Identified appropriate SMBG and/or A1C goals.    Acute complications Taught treatment of hypoglycemia - the 15 rule.    Chronic complications Relationship between chronic complications and blood glucose control    Psychosocial adjustment Identified and addressed patients feelings and concerns about diabetes    Personal strategies to promote health Review risk of smoking and offered smoking cessation      Individualized Goals (developed by patient)   Reducing Risk Other (comment)   improve blood sugars, decrease medications, prevent diabetes complications, lose weight, lead a healthier lifestyle, quit smoking, become more fit     Outcomes   Expected Outcomes Demonstrated interest in learning. Expect positive outcomes    Future DMSE 2 wks           Individualized Plan for Diabetes Self-Management Training:   Learning Objective:  Patient will have a greater understanding of diabetes self-management. Patient education plan is to attend individual and/or group sessions per assessed needs and concerns.   Plan:    Patient Instructions  Check blood sugars 4 x day before each meal and before bed every day Bring blood sugar records to the next appointment  Exercise:  Walk as tolerated  Eat 3 meals day,  2  snacks a day Space meals 4-6 hours apart Limit desserts and sweets  Quit smoking  Carry fast acting glucose and a snack at all times Rotate injection sites  Expected Outcomes:  Demonstrated interest in learning. Expect positive outcomes  Education material provided: General Meal Planning Guidelines Simple Meal Plan Glucose tablets Symptoms, causes and treatments of Hypoglycemia  If problems or questions, patient to contact team via:  Sharion Settler, RN, CCM, CDCES [phone]  Future DSME appointment: 2 wks  September 11, 2020 with this nurse

## 2020-08-22 ENCOUNTER — Telehealth: Payer: Self-pay | Admitting: *Deleted

## 2020-08-22 NOTE — Telephone Encounter (Signed)
Received call from Rexene Agent, AGNP regarding message that I left yesterday about pt's confusion of insulin. I reported to her the patient is taking Levemir as sliding scale and 62 units at bedtime. He told me that he only had 1 insulin pen type at home. I spoke with his pharmacy and he hasn't picked up NovoLog since Dec. Per her notes she has NovoLog based on sliding scale, NovoLog 65 units at bedtime and no mention of Levemir. She reports that she has explained the regimen multiple times. She will contact patient to clarify insulin administration.

## 2020-09-11 ENCOUNTER — Telehealth: Payer: Self-pay | Admitting: *Deleted

## 2020-09-11 ENCOUNTER — Ambulatory Visit: Payer: Medicaid Other | Admitting: *Deleted

## 2020-09-11 NOTE — Telephone Encounter (Signed)
Pt didn't show for his follow up appointment. Left message on voice mail to call back and reschedule.

## 2020-09-21 ENCOUNTER — Ambulatory Visit: Payer: Medicaid Other | Admitting: Surgery

## 2020-09-27 ENCOUNTER — Ambulatory Visit: Payer: Medicaid Other | Admitting: *Deleted

## 2020-09-28 ENCOUNTER — Ambulatory Visit: Payer: Medicaid Other | Admitting: Surgery

## 2020-10-19 ENCOUNTER — Encounter: Payer: Self-pay | Admitting: *Deleted

## 2021-02-06 ENCOUNTER — Emergency Department: Payer: Medicaid Other

## 2021-02-06 ENCOUNTER — Observation Stay
Admission: EM | Admit: 2021-02-06 | Discharge: 2021-02-07 | DRG: 683 | Discharge status: Against Medical Advice | Payer: Medicaid Other | Attending: Internal Medicine | Admitting: Internal Medicine

## 2021-02-06 ENCOUNTER — Other Ambulatory Visit: Payer: Self-pay

## 2021-02-06 DIAGNOSIS — Z79899 Other long term (current) drug therapy: Secondary | ICD-10-CM | POA: Diagnosis not present

## 2021-02-06 DIAGNOSIS — N179 Acute kidney failure, unspecified: Principal | ICD-10-CM | POA: Diagnosis present

## 2021-02-06 DIAGNOSIS — J449 Chronic obstructive pulmonary disease, unspecified: Secondary | ICD-10-CM | POA: Diagnosis present

## 2021-02-06 DIAGNOSIS — N400 Enlarged prostate without lower urinary tract symptoms: Secondary | ICD-10-CM | POA: Diagnosis not present

## 2021-02-06 DIAGNOSIS — G4730 Sleep apnea, unspecified: Secondary | ICD-10-CM | POA: Diagnosis not present

## 2021-02-06 DIAGNOSIS — M5000 Cervical disc disorder with myelopathy, unspecified cervical region: Secondary | ICD-10-CM | POA: Diagnosis present

## 2021-02-06 DIAGNOSIS — E1165 Type 2 diabetes mellitus with hyperglycemia: Secondary | ICD-10-CM | POA: Diagnosis present

## 2021-02-06 DIAGNOSIS — Z9841 Cataract extraction status, right eye: Secondary | ICD-10-CM

## 2021-02-06 DIAGNOSIS — Z794 Long term (current) use of insulin: Secondary | ICD-10-CM

## 2021-02-06 DIAGNOSIS — I5032 Chronic diastolic (congestive) heart failure: Secondary | ICD-10-CM | POA: Diagnosis present

## 2021-02-06 DIAGNOSIS — Z7982 Long term (current) use of aspirin: Secondary | ICD-10-CM | POA: Diagnosis not present

## 2021-02-06 DIAGNOSIS — M25551 Pain in right hip: Secondary | ICD-10-CM | POA: Diagnosis present

## 2021-02-06 DIAGNOSIS — I959 Hypotension, unspecified: Secondary | ICD-10-CM | POA: Diagnosis not present

## 2021-02-06 DIAGNOSIS — Y92009 Unspecified place in unspecified non-institutional (private) residence as the place of occurrence of the external cause: Secondary | ICD-10-CM

## 2021-02-06 DIAGNOSIS — Z981 Arthrodesis status: Secondary | ICD-10-CM | POA: Diagnosis not present

## 2021-02-06 DIAGNOSIS — R296 Repeated falls: Secondary | ICD-10-CM | POA: Diagnosis present

## 2021-02-06 DIAGNOSIS — Z7984 Long term (current) use of oral hypoglycemic drugs: Secondary | ICD-10-CM

## 2021-02-06 DIAGNOSIS — I110 Hypertensive heart disease with heart failure: Secondary | ICD-10-CM | POA: Diagnosis not present

## 2021-02-06 DIAGNOSIS — Z961 Presence of intraocular lens: Secondary | ICD-10-CM | POA: Diagnosis present

## 2021-02-06 DIAGNOSIS — Z79890 Hormone replacement therapy (postmenopausal): Secondary | ICD-10-CM | POA: Diagnosis not present

## 2021-02-06 DIAGNOSIS — Z20822 Contact with and (suspected) exposure to covid-19: Secondary | ICD-10-CM | POA: Diagnosis present

## 2021-02-06 DIAGNOSIS — W19XXXA Unspecified fall, initial encounter: Secondary | ICD-10-CM | POA: Diagnosis not present

## 2021-02-06 DIAGNOSIS — E860 Dehydration: Secondary | ICD-10-CM | POA: Diagnosis not present

## 2021-02-06 DIAGNOSIS — Z6833 Body mass index (BMI) 33.0-33.9, adult: Secondary | ICD-10-CM

## 2021-02-06 DIAGNOSIS — I10 Essential (primary) hypertension: Secondary | ICD-10-CM

## 2021-02-06 DIAGNOSIS — Z8249 Family history of ischemic heart disease and other diseases of the circulatory system: Secondary | ICD-10-CM

## 2021-02-06 DIAGNOSIS — W1830XA Fall on same level, unspecified, initial encounter: Secondary | ICD-10-CM | POA: Diagnosis present

## 2021-02-06 DIAGNOSIS — R55 Syncope and collapse: Secondary | ICD-10-CM | POA: Diagnosis present

## 2021-02-06 DIAGNOSIS — R42 Dizziness and giddiness: Secondary | ICD-10-CM | POA: Diagnosis present

## 2021-02-06 DIAGNOSIS — E119 Type 2 diabetes mellitus without complications: Secondary | ICD-10-CM

## 2021-02-06 DIAGNOSIS — F17210 Nicotine dependence, cigarettes, uncomplicated: Secondary | ICD-10-CM | POA: Diagnosis present

## 2021-02-06 DIAGNOSIS — E669 Obesity, unspecified: Secondary | ICD-10-CM | POA: Diagnosis not present

## 2021-02-06 LAB — CBC
HCT: 44.8 % (ref 39.0–52.0)
Hemoglobin: 14.9 g/dL (ref 13.0–17.0)
MCH: 30.3 pg (ref 26.0–34.0)
MCHC: 33.3 g/dL (ref 30.0–36.0)
MCV: 91.2 fL (ref 80.0–100.0)
Platelets: 236 10*3/uL (ref 150–400)
RBC: 4.91 MIL/uL (ref 4.22–5.81)
RDW: 13.8 % (ref 11.5–15.5)
WBC: 7.5 10*3/uL (ref 4.0–10.5)
nRBC: 0 % (ref 0.0–0.2)

## 2021-02-06 LAB — RESP PANEL BY RT-PCR (FLU A&B, COVID) ARPGX2
Influenza A by PCR: NEGATIVE
Influenza B by PCR: NEGATIVE
SARS Coronavirus 2 by RT PCR: NEGATIVE

## 2021-02-06 LAB — BASIC METABOLIC PANEL
Anion gap: 15 (ref 5–15)
BUN: 46 mg/dL — ABNORMAL HIGH (ref 8–23)
CO2: 20 mmol/L — ABNORMAL LOW (ref 22–32)
Calcium: 9.5 mg/dL (ref 8.9–10.3)
Chloride: 97 mmol/L — ABNORMAL LOW (ref 98–111)
Creatinine, Ser: 3.09 mg/dL — ABNORMAL HIGH (ref 0.61–1.24)
GFR, Estimated: 22 mL/min — ABNORMAL LOW (ref >60)
Glucose, Bld: 253 mg/dL — ABNORMAL HIGH (ref 70–99)
Potassium: 4.7 mmol/L (ref 3.5–5.1)
Sodium: 132 mmol/L — ABNORMAL LOW (ref 135–145)

## 2021-02-06 LAB — MAGNESIUM: Magnesium: 2.1 mg/dL (ref 1.7–2.4)

## 2021-02-06 LAB — CK: Total CK: 138 U/L (ref 49–397)

## 2021-02-06 MED ORDER — INSULIN DETEMIR 100 UNIT/ML ~~LOC~~ SOLN
22.0000 [IU] | Freq: Every day | SUBCUTANEOUS | Status: DC
  Filled 2021-02-06 (×2): qty 0.22

## 2021-02-06 MED ORDER — MELATONIN 5 MG PO TABS: 2.5000 mg | ORAL_TABLET | Freq: Every evening | ORAL | Status: DC | PRN

## 2021-02-06 MED ORDER — ATORVASTATIN CALCIUM 20 MG PO TABS
40.0000 mg | ORAL_TABLET | Freq: Every day | ORAL | Status: DC
  Administered 2021-02-07: 40 mg via ORAL
  Filled 2021-02-06: qty 2

## 2021-02-06 MED ORDER — ALBUTEROL SULFATE (2.5 MG/3ML) 0.083% IN NEBU: 3.0000 mL | INHALATION_SOLUTION | Freq: Four times a day (QID) | RESPIRATORY_TRACT | Status: DC | PRN

## 2021-02-06 MED ORDER — LACTATED RINGERS IV SOLN: INTRAVENOUS | Status: DC

## 2021-02-06 MED ORDER — MOMETASONE FURO-FORMOTEROL FUM 100-5 MCG/ACT IN AERO: 2.0000 | INHALATION_SPRAY | Freq: Two times a day (BID) | RESPIRATORY_TRACT | Status: DC

## 2021-02-06 MED ORDER — INSULIN ASPART 100 UNIT/ML IJ SOLN
0.0000 [IU] | Freq: Three times a day (TID) | INTRAMUSCULAR | Status: DC
  Administered 2021-02-07: 11 [IU] via SUBCUTANEOUS
  Administered 2021-02-07: 8 [IU] via SUBCUTANEOUS
  Administered 2021-02-07: 5 [IU] via SUBCUTANEOUS
  Filled 2021-02-06 (×3): qty 1

## 2021-02-06 MED ORDER — ACETAMINOPHEN 325 MG PO TABS: 650.0000 mg | ORAL_TABLET | Freq: Four times a day (QID) | ORAL | Status: DC | PRN

## 2021-02-06 MED ORDER — ACETAMINOPHEN 650 MG RE SUPP: 650.0000 mg | Freq: Four times a day (QID) | RECTAL | Status: DC | PRN

## 2021-02-06 MED ORDER — NICOTINE 14 MG/24HR TD PT24: 14.0000 mg | MEDICATED_PATCH | Freq: Every day | TRANSDERMAL | Status: DC | PRN

## 2021-02-06 MED ORDER — INSULIN ASPART 100 UNIT/ML IJ SOLN: 0.0000 [IU] | Freq: Every day | INTRAMUSCULAR | Status: DC

## 2021-02-06 MED ORDER — FINASTERIDE 5 MG PO TABS
5.0000 mg | ORAL_TABLET | Freq: Every day | ORAL | Status: DC
  Administered 2021-02-07: 5 mg via ORAL
  Filled 2021-02-06: qty 1

## 2021-02-06 MED ORDER — SODIUM CHLORIDE 0.9 % IV BOLUS
1000.0000 mL | Freq: Once | INTRAVENOUS | Status: AC
  Administered 2021-02-06: 1000 mL via INTRAVENOUS

## 2021-02-06 MED ORDER — LORATADINE 10 MG PO TABS
10.0000 mg | ORAL_TABLET | Freq: Every day | ORAL | Status: DC
  Administered 2021-02-07: 10 mg via ORAL
  Filled 2021-02-06: qty 1

## 2021-02-06 NOTE — ED Provider Notes (Signed)
[facility] Emergency Department Provider Note  ____________________________________________   I have reviewed the triage vital signs and the nursing notes.   HISTORY  Chief Complaint Fall   History limited by: Not Limited   HPI Juan Harper is a 60 y.o. male who presents to the emergency department today because of concerns for falls.  Patient states that he has had multiple falls today.  He says that he has felt dizzy and off balance.  It does sound like he might of lost consciousness with some of the falls.  He denies any associated chest pain or palpitations.  He states the first couple falls he fell forward however the last when he fell backwards and hit the back of his head.  He is also complaining of some pain to the right flank.  Patient states that yesterday he felt a little weak.  States that he did have a couple of falls in a single day a few years ago.  He denies any fevers.   Records reviewed. Per medical record review patient has a history of HTN, AKI.   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   Sleep apnea 11/24/2016   Diabetes (HCC) 11/24/2016   HTN (hypertension) 11/24/2016   Dizziness 11/24/2016   Acute on chronic renal failure (HCC) 09/19/2016   Cervical disc disease with myelopathy 09/07/2016   Simple chronic bronchitis (HCC) 07/16/2016   Tobacco use 07/16/2016    Closed fracture of multiple ribs of left side 02/07/2016   BPH with obstruction/lower urinary tract symptoms 05/23/2014   Elevated PSA 05/23/2014   Erectile dysfunction 05/23/2014   Chest pain 12/10/2010    Past Surgical History:  Procedure Laterality Date   BACK SURGERY     CATARACT EXTRACTION W/PHACO Right 07/31/2016   Procedure: CATARACT EXTRACTION PHACO AND INTRAOCULAR LENS PLACEMENT (IOC);  Surgeon: Nevada Crane, MD;  Location: [facility] ORS;  Service: Ophthalmology;  Laterality: Right;  Lot # [id number] H US:00:26.9 AP%:6.8% CDE: 1.83   COLONOSCOPY     NECK MASS EXCISION     PILONIDAL CYST EXCISION      Prior to Admission medications   Medication Sig Start Date End Date Taking? Authorizing Provider  acetaminophen (TYLENOL) 325 MG tablet Take 650 mg by mouth 3 (three) times daily.    [provider]  albuterol (PROVENTIL HFA;VENTOLIN HFA) 108 (90 Base) MCG/ACT inhaler Inhale 2 puffs into the lungs every 6 (six) hours as needed for wheezing or shortness of breath.    [provider]  allopurinol (ZYLOPRIM) 100 MG tablet Take 100 mg by mouth daily. 03/01/20   [provider]  amLODipine (NORVASC) 10 MG tablet Take 10 mg by mouth daily. 03/01/20   [provider]  aspirin EC 81 MG tablet  Take 81 mg by mouth daily.    [provider]  atorvastatin (LIPITOR) 40 MG tablet Take 40 mg by mouth daily.    [provider]  budesonide-formoterol (SYMBICORT) 80-4.5 MCG/ACT inhaler Inhale 2 puffs into the lungs 2 (two) times daily.    [provider]  chlorthalidone (HYGROTON) 25 MG tablet Take 25 mg by mouth daily. 03/01/20   [provider]  diclofenac sodium (VOLTAREN) 1 % GEL Apply 2 g topically 2 (two) times daily.    [provider]  ferrous sulfate (SLOW FE) 160 (50 Fe) MG TBCR SR tablet Take 160 mg by mouth daily. 07/19/20   [provider]  finasteride (PROSCAR) 5 MG tablet Take 5 mg by mouth daily.  03/01/20   [provider]  gabapentin (NEURONTIN) 300 MG capsule Take 300 mg by mouth daily. 08/17/20   [provider]  GVOKE HYPOPEN 2-PACK 1 MG/0.2ML SOAJ  07/31/20   [provider]  insulin detemir (LEVEMIR FLEXTOUCH) 100 UNIT/ML FlexPen Inject 62 Units into the skin at bedtime. 04/01/19   [provider]  lisinopril (ZESTRIL) 20 MG tablet Take 20 mg by mouth daily.    [provider]  loratadine (CLARITIN) 10 MG tablet Take 10 mg by mouth daily.    [provider]  Melatonin 5 MG CAPS Take by mouth.    [provider]  metFORMIN (GLUCOPHAGE) 500 MG tablet Take 2 tablets (1,000 mg total) by mouth 2 (two) times daily. 09/05/16   Willy Eddy, MD  oxycodone (OXY-IR) 5 MG capsule Take 5 mg by mouth every 6 (six) hours as needed.    [provider]  potassium chloride (KLOR-CON) 10 MEQ tablet Take 10 mEq by mouth daily. 01/17/20   [provider]  sitaGLIPtin (JANUVIA) 100 MG tablet Take 100 mg by mouth daily.    [provider]  tamsulosin (FLOMAX) 0.4 MG CAPS capsule Take 0.8 mg by mouth every morning.     [provider]  traZODone (DESYREL) 50 MG tablet Take 50 mg by mouth at bedtime. 03/01/20   [provider]  vitamin B-12 (CYANOCOBALAMIN) 1000 MCG tablet Take 1,000 mcg by mouth daily. 08/17/20   [provider]  Vitamin D, Ergocalciferol, (DRISDOL) 1.25 MG (50000 UNIT) CAPS capsule Take 50,000 Units by mouth every 7 (seven) days. 08/17/20   [provider]  YUPELRI 175 MCG/3ML nebulizer solution Take 175 mcg by nebulization daily. 10/31/19   [provider]    Allergies Patient has no known allergies.  Family History  Problem Relation Age of Onset   Hypertension Mother     Social History Social History   Tobacco Use   Smoking status: Every Day    Packs/day: 0.50    Types: Cigarettes   Smokeless tobacco: Never  Substance Use Topics   Alcohol use:  No   Drug use: Yes    Types: Cocaine, Marijuana    Comment: Patient states he hasn't used for a year.    Review of Systems Constitutional: No fever/chills Eyes: No visual changes. ENT: No sore throat. Cardiovascular: Denies chest pain. Respiratory: Denies shortness of breath. Gastrointestinal: Positive for right flank pain.  Genitourinary: Negative for dysuria. Musculoskeletal: Negative for back pain. Skin: Negative for rash. Neurological: Positive for headache. Positive for dizziness.   ____________________________________________   PHYSICAL EXAM:  VITAL SIGNS: ED Triage Vitals  Enc Vitals Group     BP 02/06/21 1623 99/67     Pulse Rate 02/06/21 1623 87  Resp 02/06/21 1623 18     Temp 02/06/21 1623 98 F (36.7 C)     Temp src --      SpO2 02/06/21 1623 93 %     Weight --      Height --      Head Circumference --      Peak Flow --      Pain Score 02/06/21 1621 10   Constitutional: Alert and oriented.  Eyes: Conjunctivae are normal.  ENT      Head: Normocephalic and atraumatic.      Nose: No congestion/rhinnorhea.      Mouth/Throat: Mucous membranes are moist.      Neck: No stridor. Hematological/Lymphatic/Immunilogical: No cervical lymphadenopathy. Cardiovascular: Normal rate, regular rhythm.  No murmurs, rubs, or gallops.  Respiratory: Normal respiratory effort without tachypnea nor retractions. Breath sounds are clear and equal bilaterally. No wheezes/rales/rhonchi. Gastrointestinal: Soft and non tender. No rebound. No guarding.  Genitourinary: Deferred Musculoskeletal: Normal range of motion in all extremities. No lower extremity edema. Neurologic:  Normal speech and language. No gross focal neurologic deficits are appreciated.  Skin:  Skin is warm, dry and intact. No rash noted. Psychiatric: Mood and affect are normal. Speech and behavior are normal. Patient exhibits appropriate insight and judgment.  ____________________________________________     LABS (pertinent positives/negatives)  CBC wbc 7.5, hgb 14.9, plt 236 BMP na 132, k 4.7, glu 253, cr 3.09 ____________________________________________   EKG  I, Phineas Semen, attending physician, personally viewed and interpreted this EKG  EKG Time: 1623 Rate: 89 Rhythm: sinus rhythm Axis: normal Intervals: qtc 435 QRS: narrow ST changes: no st elevation Impression: normal ekg   ____________________________________________    RADIOLOGY  CT head/cervical spine No acute intracranial abnormality. No acute osseous abnormality  ____________________________________________   PROCEDURES  Procedures  ____________________________________________   INITIAL IMPRESSION / ASSESSMENT AND PLAN / ED COURSE  Pertinent labs & imaging results that were available during my care of the patient were reviewed by me and considered in my medical decision making (see chart for details).   Patient presented to the emergency department today after multiple falls and what sound like possible syncopal episode.  Per chart review patient had very similar symptoms roughly 3 years ago.  Work-up today is concerning for AKI.  Will start IV fluids.  Will plan on admission.  ____________________________________________   FINAL CLINICAL IMPRESSION(S) / ED DIAGNOSES  Final diagnoses:  Fall, initial encounter  AKI (acute kidney injury) [facility])     Note: This dictation was prepared with Office manager. Any transcriptional errors that result from this process are unintentional     Phineas Semen, MD 02/06/21 2034

## 2021-02-06 NOTE — ED Notes (Signed)
IV not pulling back blood at this time. Attempted stick for blood work with luck. Lab called by Doreatha Martin, RN for blood draw.

## 2021-02-06 NOTE — ED Triage Notes (Signed)
Pt comes via EMs with c/o multiple falls unwitnessed. Pt states LOC all times. Pt states pain all over. Pt did hit his head on the door frame. EMS reports upon their arrival pt did vomit and become nauseated.   20g  BP-100/70 HR-84 SR CBG-251  Pt given 30cc fluids and 4 of zofran.

## 2021-02-06 NOTE — H&P (Signed)
History and Physical    PLEASE NOTE THAT DRAGON DICTATION SOFTWARE WAS USED IN THE CONSTRUCTION OF THIS NOTE.   Juan Harper YQM:[id number] DOB: 08-22-1960 DOA: 02/06/2021  PCP: Franciso Bend, NP Patient coming from: home   I have personally briefly reviewed patient's old medical records in [facility]  Chief Complaint: loss of consciousness   HPI: Juan Harper is a 60 y.o. male with medical history significant for type 2 diabetes mellitus, essential hypertension, chronic diastolic heart failure, who is admitted to [facility] on 02/06/2021  for further evaluation management of syncope after presenting from home to [facility] ED for evaluation of loss of consciousness.  Over the last 24 hours, the patient reports that he is experienced 2 episodes of dizziness and lightheadedness associated with the sensation of impending loss of consciousness when rising from a seated to a standing position at home.  He denies any associated loss of consciousness with these 2 episodes, but reports that both episodes resulted in him falling to the floor as a consequence of the associated dizziness and lightheadedness.  On the second such episode, patient reports that he hit the left temporal area of his head on the kitchen floor as a consequence of the above.  He denies any associated acute focal weakness, acute focal numbness, paresthesias, slurred speech, facial droop, word finding difficulties, vertigo, dysphagia.   Subsequent to these 2 episodes of dizziness/lightheadedness without overt loss of consciousness, the patient reports that he again developed dizziness/lightheadedness when rising from a seated to a standing position at home earlier today.  However, with this third episode, the patient reports that he experienced loss of consciousness, resulting in another fall to the floor, although he does not believe that he struck his head as a component of this fall.  While each of the  above 3 episodes were unwitnessed, the patient reports that he believes that this third episode was associated with only few seconds of loss of consciousness.  He was subsequently able to contact EMS, who brought the patient to [facility] ED for further evaluation and management of the above episodes of presyncope/syncope.  He denies any associated, preceding, or ensuing chest pain, shortness of breath, nausea, vomiting.  Reports that these episodes were not associated with any tonic-clonic activity, tongue biting, or loss of bowel/bladder function.  He denies any trauma in the days to weeks leading up to these 3 episodes of presyncope/syncope.   The patient reports that over the last 2 to 3 weeks, that he is significantly increased his daily volume of coffee consumption, without a corresponding increase in free water intake.  In response to this recent increase in coffee consumption, the patient conveys ensuing development of polyuria, noting that he typically urinates approximately 20 times per 12 hours over the last 2 weeks.  He reports that all of his outpatient medications are managed via a home health provider and he is not sure if there have been any recent modifications to his home medication regiment, including unsure if there have been any recent additions, limitations, or modifications in dosing to existing, medications.  I specifically asked him if there have been any recent modifications involving his home chlorthalidone, but the patient admits that his unsure as to this possibility.   The patient reports a very similar presentation involving presyncope/syncope in July 2018 at which time initial labs reflected acute kidney injury, with presenting serum creatinine of 4.90.  Per chart review, it appears that this acute  kidney injury was felt to be prerenal in nature, and resolved with ensuing IV fluid resuscitative efforts, with ensuing baseline creatinine range noted to be 1.0-1.2.  Of note, creatinine was  found to be 1.11 on 03/01/2017, with most recent prior serum creatinine data point noted to be 1.16 on 09/10/2020.  Following his multiple episodes of presyncope/syncope in 2018, patient denies any ensuing such events, leading up to his 3 episodes of presyncope/syncope over the last 24 hours prompting him to present to [facility] ED today for further evaluation thereof.  As a consequence of the syncopal episode earlier today, patient reports development of acute right hip discomfort.  He reports that this right hip pain is new as of the syncopal episode.  He notes this discomfort to be sharp in nature and constant since onset.  He conveys worsening of the associated intensity with movement of the right lower extremity, and denies any radiation of this discomfort from her right hip, including no radiation into the groin.  Denies any associated acute numbness or paresthesias involving the right lower extremity.  Denies any new back or neck discomfort.  Recent headache.  It appears that the patient is on a daily baby aspirin, but otherwise on no blood thinners at home.  He denies any recent subjective fever, chills, rigors, or generalized myalgias.  No recent diarrhea, melena, hematochezia, or rash.  Denies any recent dysuria or gross hematuria.     ED Course:  Vital signs in the ED were notable for the following: Afebrile; heart rate 86-91; initial blood pressure 99/67, which increased to 108/64 following interval IV fluid administration, as further quantified below; respiratory rate 15-19, oxygen saturation 95 to 100% on room air.  Labs were notable for the following: BMP was notable for the following: Sodium 132, which corrects to 134 when taking into account concomitant hyperglycemia, potassium 4.7, chloride 97, bicarbonate 20, anion gap 15, BUN 46, creatinine 3.09, glucose 253.  Urinalysis has been ordered, with result currently pending.  CBC notable for white blood cell count 7500.  COVID-19/influenza PCR were  checked in the ED today found to be negative.  Imaging and additional notable ED work-up: EKG, in comparison to most recent prior EKG from 03/02/2017, she was sent the rhythm with heart rate 89, normal intervals, nonspecific T wave inversion in aVL, which is unchanged from most recent prior EKG, no interval evidence of ST changes, including no evidence of ST elevation.  Noncontrast CT that shows no evidence of acute intracranial process, including no evidence of intracranial hemorrhage, while CT cervical spine shows no evidence of acute displaced fracture or traumatic listhesis of the cervical spine.  While in the ED, the following were administered: Normal saline x1 L bolus.  Subsequently, the patient was admitted to the med telemetry floor for further evaluation acute kidney injury in the setting of presenting syncope.     Review of Systems: As per HPI otherwise 10 point review of systems negative.   Past Medical History:  Diagnosis Date   Arthritis    NECK/ RIGHT KNEE   Bell's palsy    BPH (benign prostatic hyperplasia)    Cough    chronic   Diabetes mellitus without complication (HCC)    Edema    legs/feet   Fusion of spine of cervical region    PLANNED FOR APRIL   Hypertension    Orthopnea    Shortness of breath dyspnea    Sleep apnea    cpap   Wheezing  Past Surgical History:  Procedure Laterality Date   BACK SURGERY     CATARACT EXTRACTION W/PHACO Right 07/31/2016   Procedure: CATARACT EXTRACTION PHACO AND INTRAOCULAR LENS PLACEMENT (IOC);  Surgeon: Nevada Crane, MD;  Location: [facility] ORS;  Service: Ophthalmology;  Laterality: Right;  Lot # [id number] H US:00:26.9 AP%:6.8% CDE: 1.83   COLONOSCOPY     NECK MASS EXCISION     PILONIDAL CYST EXCISION      Social History:  reports that he has been smoking cigarettes. He has been smoking an average of .5 packs per day. He has never used smokeless tobacco. He reports current drug use. Drugs: Cocaine and Marijuana. He  reports that he does not drink alcohol.   No Known Allergies  Family History  Problem Relation Age of Onset   Hypertension Mother     Family history reviewed and not pertinent    Prior to Admission medications   Medication Sig Start Date End Date Taking? Authorizing Provider  acetaminophen (TYLENOL) 325 MG tablet Take 650 mg by mouth 3 (three) times daily.    [provider]  albuterol (PROVENTIL HFA;VENTOLIN HFA) 108 (90 Base) MCG/ACT inhaler Inhale 2 puffs into the lungs every 6 (six) hours as needed for wheezing or shortness of breath.    [provider]  allopurinol (ZYLOPRIM) 100 MG tablet Take 100 mg by mouth daily. 03/01/20   [provider]  amLODipine (NORVASC) 10 MG tablet Take 10 mg by mouth daily. 03/01/20   [provider]  aspirin EC 81 MG tablet Take 81 mg by mouth daily.    [provider]  atorvastatin (LIPITOR) 40 MG tablet Take 40 mg by mouth daily.    [provider]  budesonide-formoterol (SYMBICORT) 80-4.5 MCG/ACT inhaler Inhale 2 puffs into the lungs 2 (two) times daily.    [provider]  chlorthalidone (HYGROTON) 25 MG tablet Take 25 mg by mouth daily. 03/01/20   [provider]  diclofenac sodium (VOLTAREN) 1 % GEL Apply 2 g topically 2 (two) times daily.    [provider]  ferrous sulfate (SLOW FE) 160 (50 Fe) MG TBCR SR tablet Take 160 mg by mouth daily. 07/19/20   [provider]  finasteride (PROSCAR) 5 MG tablet Take 5 mg by mouth daily. 03/01/20   [provider]  gabapentin (NEURONTIN) 300 MG capsule Take 300 mg by mouth daily. 08/17/20   [provider]  GVOKE HYPOPEN 2-PACK 1 MG/0.2ML SOAJ  07/31/20   [provider]  insulin detemir (LEVEMIR FLEXTOUCH) 100 UNIT/ML FlexPen Inject 62 Units into the skin at bedtime. 04/01/19   [provider]  lisinopril (ZESTRIL) 20 MG tablet Take 20 mg by mouth daily.    [provider]   loratadine (CLARITIN) 10 MG tablet Take 10 mg by mouth daily.    [provider]  Melatonin 5 MG CAPS Take by mouth.    [provider]  metFORMIN (GLUCOPHAGE) 500 MG tablet Take 2 tablets (1,000 mg total) by mouth 2 (two) times daily. 09/05/16   Willy Eddy, MD  oxycodone (OXY-IR) 5 MG capsule Take 5 mg by mouth every 6 (six) hours as needed.    [provider]  potassium chloride (KLOR-CON) 10 MEQ tablet Take 10 mEq by mouth daily. 01/17/20   [provider]  sitaGLIPtin (JANUVIA) 100 MG tablet Take 100 mg by mouth daily.    [provider]  tamsulosin (FLOMAX) 0.4 MG CAPS capsule Take 0.8 mg by mouth every  morning.     [provider]  traZODone (DESYREL) 50 MG tablet Take 50 mg by mouth at bedtime. 03/01/20   [provider]  vitamin B-12 (CYANOCOBALAMIN) 1000 MCG tablet Take 1,000 mcg by mouth daily. 08/17/20   [provider]  Vitamin D, Ergocalciferol, (DRISDOL) 1.25 MG (50000 UNIT) CAPS capsule Take 50,000 Units by mouth every 7 (seven) days. 08/17/20   [provider]  YUPELRI 175 MCG/3ML nebulizer solution Take 175 mcg by nebulization daily. 10/31/19   [provider]     Objective    Physical Exam: Vitals:   02/06/21 1730 02/06/21 1833 02/06/21 1900 02/06/21 1930  BP: 111/62 108/64 106/63 98/60  Pulse: 90 86 90 88  Resp: 19 17 16 15   Temp:      SpO2: 100% 100% 95% 94%    General: appears to be stated age; alert, oriented Skin: warm, dry, no rash Head:  AT/[place] Mouth:  Oral mucosa membranes appear dry, normal dentition Neck: supple; trachea midline Heart:  RRR; did not appreciate any M/R/G Lungs: CTAB, did not appreciate any wheezes, rales, or rhonchi Abdomen: + BS; soft, ND, NT Vascular: 2+ pedal pulses b/l; 2+ radial pulses b/l Extremities: no peripheral edema, no muscle wasting Neuro: strength and sensation intact in upper and lower extremities b/l     Labs on Admission: I  have personally reviewed following labs and imaging studies  CBC: Recent Labs  Lab 02/06/21 1738  WBC 7.5  HGB 14.9  HCT 44.8  MCV 91.2  PLT 236   Basic Metabolic Panel: Recent Labs  Lab 02/06/21 1738  NA 132*  K 4.7  CL 97*  CO2 20*  GLUCOSE 253*  BUN 46*  CREATININE 3.09*  CALCIUM 9.5   GFR: CrCl cannot be calculated (Unknown ideal weight.). Liver Function Tests: No results for input(s): AST, ALT, ALKPHOS, BILITOT, PROT, ALBUMIN in the last 168 hours. No results for input(s): LIPASE, AMYLASE in the last 168 hours. No results for input(s): AMMONIA in the last 168 hours. Coagulation Profile: No results for input(s): INR, PROTIME in the last 168 hours. Cardiac Enzymes: No results for input(s): CKTOTAL, CKMB, CKMBINDEX, TROPONINI in the last 168 hours. BNP (last 3 results) No results for input(s): PROBNP in the last 8760 hours. HbA1C: No results for input(s): HGBA1C in the last 72 hours. CBG: No results for input(s): GLUCAP in the last 168 hours. Lipid Profile: No results for input(s): CHOL, HDL, LDLCALC, TRIG, CHOLHDL, LDLDIRECT in the last 72 hours. Thyroid Function Tests: No results for input(s): TSH, T4TOTAL, FREET4, T3FREE, THYROIDAB in the last 72 hours. Anemia Panel: No results for input(s): VITAMINB12, FOLATE, FERRITIN, TIBC, IRON, RETICCTPCT in the last 72 hours. Urine analysis:    Component Value Date/Time   COLORURINE YELLOW (A) 02/26/2017 1353   APPEARANCEUR CLEAR (A) 02/26/2017 1353   LABSPEC 1.013 02/26/2017 1353   PHURINE 5.0 02/26/2017 1353   GLUCOSEU 50 (A) 02/26/2017 1353   HGBUR NEGATIVE 02/26/2017 1353   BILIRUBINUR NEGATIVE 02/26/2017 1353   KETONESUR NEGATIVE 02/26/2017 1353   PROTEINUR NEGATIVE 02/26/2017 1353   NITRITE NEGATIVE 02/26/2017 1353   LEUKOCYTESUR NEGATIVE 02/26/2017 1353    Radiological Exams on Admission: CT HEAD WO CONTRAST (04/28/2017)  Result Date: 02/06/2021 CLINICAL DATA:  Status post fall. EXAM: CT HEAD WITHOUT  CONTRAST CT CERVICAL SPINE WITHOUT CONTRAST TECHNIQUE: Multidetector CT imaging of the head and cervical spine was performed following the standard protocol without intravenous contrast. Multiplanar CT image reconstructions of the cervical spine  were also generated. COMPARISON:  CT head and C-spine 11/24/2016 FINDINGS: CT HEAD FINDINGS BRAIN: BRAIN Patchy and confluent areas of decreased attenuation are noted throughout the deep and periventricular white matter of the cerebral hemispheres bilaterally, compatible with chronic microvascular ischemic disease. No evidence of large-territorial acute infarction. No parenchymal hemorrhage. No mass lesion. No extra-axial collection. No mass effect or midline shift. No hydrocephalus. Basilar cisterns are patent. Vascular: No hyperdense vessel. Atherosclerotic calcifications are present within the cavernous internal carotid arteries. Skull: No acute fracture or focal lesion. Sinuses/Orbits: Paranasal sinuses and mastoid air cells are clear. Right lens replacement. Otherwise orbits are unremarkable. Other: None. CT CERVICAL SPINE FINDINGS Alignment: Normal. Skull base and vertebrae: Multilevel degenerative changes of the spine in a patient status post anterior and interbody fusion surgical hardware at the C3 through C5 levels. C4 corpectomy. No severe osseous neural foraminal or central canal stenosis. No acute fracture. No aggressive appearing focal osseous lesion or focal pathologic process. Soft tissues and spinal canal: No prevertebral fluid or swelling. No visible canal hematoma. Upper chest: Biapical pleural/pulmonary scarring. Paraseptal emphysematous changes. Other: None. IMPRESSION: 1. No acute intracranial abnormality. 2. No acute displaced fracture or traumatic listhesis of the cervical spine in a patient status post C3 - C5 anterior and interbody fusion. Electronically Signed   By: Tish Frederickson M.D.   On: 02/06/2021 18:11   CT Cervical Spine Wo  Contrast  Result Date: 02/06/2021 CLINICAL DATA:  Status post fall. EXAM: CT HEAD WITHOUT CONTRAST CT CERVICAL SPINE WITHOUT CONTRAST TECHNIQUE: Multidetector CT imaging of the head and cervical spine was performed following the standard protocol without intravenous contrast. Multiplanar CT image reconstructions of the cervical spine were also generated. COMPARISON:  CT head and C-spine 11/24/2016 FINDINGS: CT HEAD FINDINGS BRAIN: BRAIN Patchy and confluent areas of decreased attenuation are noted throughout the deep and periventricular white matter of the cerebral hemispheres bilaterally, compatible with chronic microvascular ischemic disease. No evidence of large-territorial acute infarction. No parenchymal hemorrhage. No mass lesion. No extra-axial collection. No mass effect or midline shift. No hydrocephalus. Basilar cisterns are patent. Vascular: No hyperdense vessel. Atherosclerotic calcifications are present within the cavernous internal carotid arteries. Skull: No acute fracture or focal lesion. Sinuses/Orbits: Paranasal sinuses and mastoid air cells are clear. Right lens replacement. Otherwise orbits are unremarkable. Other: None. CT CERVICAL SPINE FINDINGS Alignment: Normal. Skull base and vertebrae: Multilevel degenerative changes of the spine in a patient status post anterior and interbody fusion surgical hardware at the C3 through C5 levels. C4 corpectomy. No severe osseous neural foraminal or central canal stenosis. No acute fracture. No aggressive appearing focal osseous lesion or focal pathologic process. Soft tissues and spinal canal: No prevertebral fluid or swelling. No visible canal hematoma. Upper chest: Biapical pleural/pulmonary scarring. Paraseptal emphysematous changes. Other: None. IMPRESSION: 1. No acute intracranial abnormality. 2. No acute displaced fracture or traumatic listhesis of the cervical spine in a patient status post C3 - C5 anterior and interbody fusion. Electronically  Signed   By: Tish Frederickson M.D.   On: 02/06/2021 18:11     EKG: Independently reviewed, with result as described above.    Assessment/Plan   Juan Harper is a 60 y.o. male with medical history significant for type 2 diabetes mellitus, essential hypertension, chronic diastolic heart failure, who is admitted to [facility] on 02/06/2021  for further evaluation management of syncope after presenting from home to [facility] ED for evaluation of loss of consciousness.   Principal Problem:  AKI (acute kidney injury) (HCC) Active Problems:   Diabetes (HCC)   HTN (hypertension)   Fall at home, initial encounter   Syncope   Right hip pain   BPH (benign prostatic hyperplasia)   Chronic diastolic CHF (congestive heart failure) (HCC)      #) Acute kidney injury: Relative to baseline creatinine range of 1.0-1.2, presenting serum creatinine found to be 3.09.  Suspect a prerenal contribution from dehydration as a consequence of recent increase in effective diuresis as a consequence of recent increase in coffee consumption without compensatory increase in water consumption, with potential LE multifactorial pharmacologic contributing/exacerbating factors in the form of lisinopril as well as that posed by chlorthalidone.  Additionally, in the setting of as needed use of oxycodone, there is likely diminished renal metabolism and clearance of this medication in the setting of initial AKI, resulting in increase in associated nephrotoxic metabolites, which may have further contributed to patient's worsening renal function.  Increases patient's blood pressure following initial IV fluid bolus appears to support suspect an element of presenting dehydration.  Urinalysis with microscopy has been ordered, with result currently pending.  While the patient denies any associated prolonged downtime after falls relating to his episodes of presyncope/syncope earlier today, will also check CPK to further  evaluate, no presenting AKI.  Plan: Follow-up result urinalysis with microscopy, with particular attention for the presence of urinary casts.  Add on random urine sodium as well as random urine creatinine.  Monitor strict I's and O's and daily weights.  Tempt avoid nephrotoxic agents.  Hold home lisinopril and chlorthalidone for now.  Continuous lactated Ringer's to be initiated following completion of orthostatic vital signs, as further detailed below.  Repeat BMP in the morning.  Hold potassium chloride, as well as oxycodone.  Hold home gabapentin.  Add on CPK level, as above.        #) Syncope: 1 episode of suspected syncope that occurred when the patient was moving from a seated to a standing position and associated with prodrome, believed to be consistent with orthostatic hypotension in the setting of dehydration given patient's report of recent increase increase in cough induced diuresis without compensatory increase water consumption, and exacerbated by home pharmacologic factors serving to diminish compensatory vasoconstrictive response in the setting of outpatient lisinopril, Norvasc, Flomax, with chlorthalidone further exacerbated suspected element of dehydration.  This episode of syncope was also preceded by 2 episodes of presyncope, which also appear to be consistent with an element of orthostatic hypotension. Not associated with any overt acute focal neurologic deficits. Clinically, acute ischemic stroke versus seizures appear less likely at this time. Presentation appears less consistent with ACS, with presenting EKG showing SR with no evidence of acute ischemic changes relative to most recent prior. Will check orthostatic vital signs, but with the caveat that the patient has already received IVF's in the ED, potentially altering the results of this evaluation.  In the context of documented history of diabetes, differential also includes potential contributory autonomic dysfunction.  Of note,  per chart review, it also appears that the patient has a distant history of cocaine use. Will check UDS to further evaluate.     Plan: I have placed a nursing communication order requesting that orthostatic vital signs x 1 set be checked and documented, following which will initiate gentle IV fluids in the form of LR @ 100cc/hr. Monitor on telemetry. Hold home lisinopril, Norvasc, chlorthalidone, Flomax for now.   Monitor strict I's and O's.  Check CMP, CBC,  serum magnesium level in the morning. Fall precautions ordered.  Check UDS, as above.  Bilateral carotid ultrasounds also been ordered for the morning.       #) Ground-level mechanical falls: 3 episodes of ground-level mechanical falls that occurred as a consequence of 2 episodes of presyncope and one episode of syncope over the course of the last 24 hours, as further detailed above, with 1 of these episodes reportedly associated with the patient hitting his head.  No evidence of associated acute focal neurologic deficits, and presenting CT shows no evidence of acute intracranial process, including no evidence of intracranial hemorrhage, while associated CT cervical spine shows no evidence of acute displaced fracture or traumatic listhesis of the cervical spine. Will initiate fall precautions and engage in further evaluation management of presenting syncope/presyncope and suspected contributory dehydration, as further detailed above.  In addition to suspected contribution from dehydration leading to aforementioned episodes of presyncope and syncope, the patient is also at heightened fall risk as a consequence of his acute kidney injury resulting in diminished renal metabolism and clearance of his as needed oxycodone likely resulting in higher than baseline circulating levels of this medication representing independent fall risk for this patient in an acute to subacute setting.   Plan: Fall precautions ordered.  Further evaluation management of acute  kidney injury, including IV fluids, as above.  Further evaluation management of syncope/presyncope, as above.  Hold home oxycodone for now, as above.  Check urinalysis.  Repeat CMP and CBC in the morning.  Check urinary drug screen.       #) Acute right hip pain: As a consequence of the E3 falls that the patient experienced early to his episodes of presyncope/syncope over the course the last day, patient reports development of acute right hip discomfort.  Right lower extremity appears neurovascularly intact at this time, we will further evaluate with plain films of the right hip/pelvis to further evaluate this new right hip discomfort.   Plan: As needed acetaminophen.  Plain films of the right hip, as above.       #) Benign prostatic hyperplasia: Documented history of such, on finasteride as well as Flomax as an outpatient.  We will hold Flomax for now in the setting of syncope with suspected contribution from orthostatic hypotension complicated by diminished compensatory vasoconstriction as a consequence of multiple outpatient pharmacologic influences, including peripheral vasodilatory impacts of tamsulosin.   Plan: Continue finasteride.  Hold home Flomax for now, as above.  Monitor strict I's and O's and daily weights.  Repeat BMP in the morning.      #) Type 2 diabetes mellitus: On Levemir 62 units subcu nightly.  Not on any scheduled or sliding scale short acting insulin at home.  His outpatient oral hypoglycemic regimen includes metformin as well as Januvia.  Presenting blood sugar found to be 253 without associated evidence of anion gap metabolic acidosis to suggest DKA  In terms of initial basal insulin dose during his hospitalization, will start with 22 units subcu nightly, first dose now.  Accu-Cheks before every meal and at bedtime with moderate dose sliding scale insulin.  Hold home Januvia and metformin during this hospitalization.         #) COPD: Documented history  of such in the setting of being a current smoker home respiratory regimen includes Symbicort as well as as needed albuterol.  No evidence to suggest acute COPD exacerbation at this time.  Plan: Continue respiratory regimen.  Repeat BMP in the morning.  Counseled patient on the importance of complete smoking discontinuation.     #) Chronic tobacco abuse: Patient reports that he has a current smoker, having smoked at least half pack per day over the last 20 years.  Plan: Counseled the patient on the importance of complete smoking discontinuation, particular in setting of underlying COPD.  As needed nicotine patch has been ordered for use during this hospitalization.        #) Chronic diastolic heart failure: Documented history of such, but his recent echocardiogram from July 2018 notable for normal left ventricle cavity size, moderate LVH, LVEF 60 to 65%, grade 1 diastolic dysfunction, mildly dilated left atrium, no significant valvular pathology, and normal systolic function associated with the right ventricle.  Aside from chlorthalidone, the patient is not on any scheduled diuretic medications at home, although he appears to have amplify it is effective diuresis over the last few weeks in the setting of concomitant increase in caffeine intake over that time, as further detailed above.  No clinical evidence to suggest acutely decompensated heart failure at this time.  Plan: Hold chlorthalidone for now.  Monitor strict I's and O's and daily weights.  Add on serum magnesium level.  Repeat BMP in the morning.     DVT prophylaxis: SCDs Code Status: Full code Family Communication: none Disposition Plan: Per Rounding Team Consults called: none;  Admission status: Inpatient; med telemetry     Of note, this patient was added by me to the following Admit List/Treatment Team: armcadmits.    Of note, the Adult Admission Order Set (Multimorbid order set) was used by me in the admission process  for this patient.   PLEASE NOTE THAT DRAGON DICTATION SOFTWARE WAS USED IN THE CONSTRUCTION OF THIS NOTE.   Angie Fava DO [facility] Pager [phone] From 6PM - 2AM  Otherwise, please contact night-coverage  [email or web address] Password [facility]   02/06/2021, 8:43 PM

## 2021-02-06 NOTE — ED Notes (Signed)
Lab called to send someone for blood work.

## 2021-02-07 ENCOUNTER — Encounter: Payer: Self-pay | Admitting: Internal Medicine

## 2021-02-07 ENCOUNTER — Inpatient Hospital Stay: Payer: Medicaid Other

## 2021-02-07 DIAGNOSIS — R55 Syncope and collapse: Secondary | ICD-10-CM | POA: Diagnosis not present

## 2021-02-07 DIAGNOSIS — I5032 Chronic diastolic (congestive) heart failure: Secondary | ICD-10-CM | POA: Diagnosis not present

## 2021-02-07 DIAGNOSIS — N400 Enlarged prostate without lower urinary tract symptoms: Secondary | ICD-10-CM | POA: Diagnosis present

## 2021-02-07 DIAGNOSIS — E1165 Type 2 diabetes mellitus with hyperglycemia: Secondary | ICD-10-CM | POA: Diagnosis not present

## 2021-02-07 DIAGNOSIS — Y92009 Unspecified place in unspecified non-institutional (private) residence as the place of occurrence of the external cause: Secondary | ICD-10-CM

## 2021-02-07 DIAGNOSIS — N179 Acute kidney failure, unspecified: Secondary | ICD-10-CM | POA: Diagnosis not present

## 2021-02-07 DIAGNOSIS — W19XXXA Unspecified fall, initial encounter: Secondary | ICD-10-CM

## 2021-02-07 DIAGNOSIS — M25551 Pain in right hip: Secondary | ICD-10-CM | POA: Diagnosis present

## 2021-02-07 LAB — COMPREHENSIVE METABOLIC PANEL
ALT: 27 U/L (ref 0–44)
AST: 22 U/L (ref 15–41)
Albumin: 3.7 g/dL (ref 3.5–5.0)
Alkaline Phosphatase: 60 U/L (ref 38–126)
Anion gap: 11 (ref 5–15)
BUN: 44 mg/dL — ABNORMAL HIGH (ref 8–23)
CO2: 25 mmol/L (ref 22–32)
Calcium: 9.4 mg/dL (ref 8.9–10.3)
Chloride: 100 mmol/L (ref 98–111)
Creatinine, Ser: 1.86 mg/dL — ABNORMAL HIGH (ref 0.61–1.24)
GFR, Estimated: 40 mL/min — ABNORMAL LOW (ref >60)
Glucose, Bld: 250 mg/dL — ABNORMAL HIGH (ref 70–99)
Potassium: 4.4 mmol/L (ref 3.5–5.1)
Sodium: 136 mmol/L (ref 135–145)
Total Bilirubin: 0.5 mg/dL (ref 0.3–1.2)
Total Protein: 7.4 g/dL (ref 6.5–8.1)

## 2021-02-07 LAB — URINALYSIS, COMPLETE (UACMP) WITH MICROSCOPIC
Bacteria, UA: NONE SEEN
Bilirubin Urine: NEGATIVE
Glucose, UA: >=500 mg/dL — AB
Hgb urine dipstick: NEGATIVE
Ketones, ur: NEGATIVE mg/dL
Leukocytes,Ua: NEGATIVE
Nitrite: NEGATIVE
Protein, ur: NEGATIVE mg/dL
Specific Gravity, Urine: 1.025 (ref 1.005–1.030)
pH: 5 (ref 5.0–8.0)

## 2021-02-07 LAB — MAGNESIUM: Magnesium: 2 mg/dL (ref 1.7–2.4)

## 2021-02-07 LAB — CBG MONITORING, ED
Glucose-Capillary: 172 mg/dL — ABNORMAL HIGH (ref 70–99)
Glucose-Capillary: 217 mg/dL — ABNORMAL HIGH (ref 70–99)
Glucose-Capillary: 252 mg/dL — ABNORMAL HIGH (ref 70–99)
Glucose-Capillary: 347 mg/dL — ABNORMAL HIGH (ref 70–99)

## 2021-02-07 LAB — CBC
HCT: 43.1 % (ref 39.0–52.0)
Hemoglobin: 15 g/dL (ref 13.0–17.0)
MCH: 31.3 pg (ref 26.0–34.0)
MCHC: 34.8 g/dL (ref 30.0–36.0)
MCV: 89.8 fL (ref 80.0–100.0)
Platelets: 232 10*3/uL (ref 150–400)
RBC: 4.8 MIL/uL (ref 4.22–5.81)
RDW: 13.7 % (ref 11.5–15.5)
WBC: 6.6 10*3/uL (ref 4.0–10.5)
nRBC: 0 % (ref 0.0–0.2)

## 2021-02-07 LAB — URINE DRUG SCREEN, QUALITATIVE (ARMC ONLY)
Amphetamines, Ur Screen: NOT DETECTED
Barbiturates, Ur Screen: NOT DETECTED
Benzodiazepine, Ur Scrn: NOT DETECTED
Cannabinoid 50 Ng, Ur ~~LOC~~: POSITIVE — AB
Cocaine Metabolite,Ur ~~LOC~~: POSITIVE — AB
MDMA (Ecstasy)Ur Screen: NOT DETECTED
Methadone Scn, Ur: NOT DETECTED
Opiate, Ur Screen: NOT DETECTED
Phencyclidine (PCP) Ur S: NOT DETECTED
Tricyclic, Ur Screen: NOT DETECTED

## 2021-02-07 LAB — HIV ANTIBODY (ROUTINE TESTING W REFLEX): HIV Screen 4th Generation wRfx: NONREACTIVE

## 2021-02-07 LAB — PHOSPHORUS: Phosphorus: 3.6 mg/dL (ref 2.5–4.6)

## 2021-02-07 LAB — SODIUM, URINE, RANDOM: Sodium, Ur: 39 mmol/L

## 2021-02-07 LAB — CREATININE, URINE, RANDOM: Creatinine, Urine: 227 mg/dL

## 2021-02-07 MED ORDER — INSULIN DETEMIR 100 UNIT/ML ~~LOC~~ SOLN
30.0000 [IU] | Freq: Once | SUBCUTANEOUS | Status: AC
  Administered 2021-02-07: 30 [IU] via SUBCUTANEOUS
  Filled 2021-02-07: qty 0.3

## 2021-02-07 MED ORDER — MOMETASONE FURO-FORMOTEROL FUM 100-5 MCG/ACT IN AERO
2.0000 | INHALATION_SPRAY | Freq: Two times a day (BID) | RESPIRATORY_TRACT | Status: DC
  Administered 2021-02-07: 2 via RESPIRATORY_TRACT
  Filled 2021-02-07: qty 8.8

## 2021-02-07 NOTE — ED Notes (Signed)
Located and provided pt with TV remote

## 2021-02-07 NOTE — ED Notes (Signed)
Patient provided nutritional tray x 2 juices

## 2021-02-07 NOTE — Progress Notes (Addendum)
Progress Note    Juan Harper  ALP:[id number] DOB: [birth year]  DOA: 02/06/2021 PCP: Franciso Bend, NP      Brief Narrative:    Medical records reviewed and are as summarized below:  Juan Harper is a 60 y.o. male with medical history significant for type 2 diabetes mellitus, hypertension, chronic diastolic CHF, cocaine and marijuana use disorder, who presented to the hospital because of syncope with loss of consciousness, dizziness and generalized weakness.  He was hypotensive in the emergency room and he was found to have AKI.    Assessment/Plan:   Principal Problem:   AKI (acute kidney injury) (HCC) Active Problems:   Diabetes (HCC)   HTN (hypertension)   Fall at home, initial encounter   Syncope   Right hip pain   BPH (benign prostatic hyperplasia)   Chronic diastolic CHF (congestive heart failure) (HCC)   Body mass index is 33.53 kg/m.  (Obesity)   S/p syncope, s/p fall: Likely due to hypotension/dehydration  AKI: This is likely prerenal.  Continue IV fluids for hydration.  Monitor BMP.  Lisinopril, Farxiga and chlorthalidone have been held  Hypotension: Antihypertensives (chlorthalidone, amlodipine, lisinopril) have been held.  Monitor BP closely.  Type II DM with hyperglycemia: Of note, patient did not get Levemir last night.  He takes Levemir 60 units nightly.  Start with 30 units Levemir and adjust dose based on glucose levels.  Conservative approach has been adopted because of AKI. Sitagliptin, Farxiga and metformin have been held.  Generalized weakness and recent fall: Consult PT  Chronic diastolic CHF and COPD: Compensated.  2D echo in July 2018 showed EF estimated at 60 to 65%, moderate LVH, grade 1 diastolic dysfunction  Tobacco use disorder, cocaine use disorder, marijuana use disorder: Urine drug screen was positive for cocaine and cannabinoid.  Counseled to quit   Diet Order             Diet Carb Modified Fluid consistency: Thin;  Room service appropriate? Yes  Diet effective now                      Consultants: None  Procedures: None    Medications:    atorvastatin  40 mg Oral Daily   finasteride  5 mg Oral Daily   insulin aspart  0-15 Units Subcutaneous TID WC   insulin aspart  0-5 Units Subcutaneous QHS   insulin detemir  22 Units Subcutaneous QHS   loratadine  10 mg Oral Daily   mometasone-formoterol  2 puff Inhalation BID   Continuous Infusions:  lactated ringers 100 mL/hr at 02/07/21 0748     Anti-infectives (From admission, onward)    None              Family Communication/Anticipated D/C date and plan/Code Status   DVT prophylaxis: SCDs Start: 02/06/21 2037     Code Status: Full Code  Family Communication: None Disposition Plan:    Status is: Inpatient  Remains inpatient appropriate because:IV treatments appropriate due to intensity of illness or inability to take PO  Dispo: The patient is from: Home              Anticipated d/c is to: Home              Patient currently is not medically stable to d/c.   Difficult to place patient No           Subjective:   C/o generalized weakness and dizziness.  No chest pain or shortness of breath.  Objective:    Vitals:   02/07/21 1011 02/07/21 1100 02/07/21 1153 02/07/21 1200  BP: 115/67  121/73 111/72  Pulse: 85 83 79 81  Resp: (!) 22 16 14 15   Temp:   98.1 F (36.7 C)   TempSrc:   Oral   SpO2: 95% 98% 100% 93%  Weight:      Height:       No data found.   Intake/Output Summary (Last 24 hours) at 02/07/2021 1242 Last data filed at 02/07/2021 1146 Gross per 24 hour  Intake --  Output 1150 ml  Net -1150 ml   Filed Weights   02/07/21 0400  Weight: 106 kg    Exam:  GEN: NAD SKIN: No rash EYES: EOMI ENT: MMM CV: RRR PULM: CTA B ABD: soft, obese, NT, +BS CNS: AAO x 3, non focal EXT: No edema or tenderness        Data Reviewed:   I have personally reviewed following labs and  imaging studies:  Labs: Labs show the following:   Basic Metabolic Panel: Recent Labs  Lab 02/06/21 1738 02/06/21 2039 02/07/21 0837  NA 132*  --  136  K 4.7  --  4.4  CL 97*  --  100  CO2 20*  --  25  GLUCOSE 253*  --  250*  BUN 46*  --  44*  CREATININE 3.09*  --  1.86*  CALCIUM 9.5  --  9.4  MG  --  2.1 2.0  PHOS  --   --  3.6   GFR Estimated Creatinine Clearance: 50.2 mL/min (A) (by C-G formula based on SCr of 1.86 mg/dL (H)). Liver Function Tests: Recent Labs  Lab 02/07/21 0837  AST 22  ALT 27  ALKPHOS 60  BILITOT 0.5  PROT 7.4  ALBUMIN 3.7   No results for input(s): LIPASE, AMYLASE in the last 168 hours. No results for input(s): AMMONIA in the last 168 hours. Coagulation profile No results for input(s): INR, PROTIME in the last 168 hours.  CBC: Recent Labs  Lab 02/06/21 1738 02/07/21 0837  WBC 7.5 6.6  HGB 14.9 15.0  HCT 44.8 43.1  MCV 91.2 89.8  PLT 236 232   Cardiac Enzymes: Recent Labs  Lab 02/06/21 2039  CKTOTAL 138   BNP (last 3 results) No results for input(s): PROBNP in the last 8760 hours. CBG: Recent Labs  Lab 02/06/21 2355 02/07/21 0736 02/07/21 1143  GLUCAP 172* 252* 347*   D-Dimer: No results for input(s): DDIMER in the last 72 hours. Hgb A1c: No results for input(s): HGBA1C in the last 72 hours. Lipid Profile: No results for input(s): CHOL, HDL, LDLCALC, TRIG, CHOLHDL, LDLDIRECT in the last 72 hours. Thyroid function studies: No results for input(s): TSH, T4TOTAL, T3FREE, THYROIDAB in the last 72 hours.  Invalid input(s): FREET3 Anemia work up: No results for input(s): VITAMINB12, FOLATE, FERRITIN, TIBC, IRON, RETICCTPCT in the last 72 hours. Sepsis Labs: Recent Labs  Lab 02/06/21 1738 02/07/21 0837  WBC 7.5 6.6    Microbiology Recent Results (from the past 240 hour(s))  Resp Panel by RT-PCR (Flu A&B, Covid) Nasopharyngeal Swab     Status: None   Collection Time: 02/06/21  7:26 PM   Specimen: Nasopharyngeal  Swab; Nasopharyngeal(NP) swabs in vial transport medium  Result Value Ref Range Status   SARS Coronavirus 2 by RT PCR NEGATIVE NEGATIVE Final    Comment: (NOTE) SARS-CoV-2 target nucleic acids are NOT DETECTED.  The SARS-CoV-2 RNA is generally detectable in upper respiratory specimens during the acute phase of infection. The lowest concentration of SARS-CoV-2 viral copies this assay can detect is 138 copies/mL. A negative result does not preclude SARS-Cov-2 infection and should not be used as the sole basis for treatment or other patient management decisions. A negative result may occur with  improper specimen collection/handling, submission of specimen other than nasopharyngeal swab, presence of viral mutation(s) within the areas targeted by this assay, and inadequate number of viral copies(<138 copies/mL). A negative result must be combined with clinical observations, patient history, and epidemiological information. The expected result is Negative.  Fact Sheet for Patients:  BloggerCourse.com  Fact Sheet for Healthcare Providers:  SeriousBroker.it  This test is no t yet approved or cleared by the [place] FDA and  has been authorized for detection and/or diagnosis of SARS-CoV-2 by FDA under an Emergency Use Authorization (EUA). This EUA will remain  in effect (meaning this test can be used) for the duration of the COVID-19 declaration under Section 564(b)(1) of the Act, 21 U.S.C.section 360bbb-3(b)(1), unless the authorization is terminated  or revoked sooner.       Influenza A by PCR NEGATIVE NEGATIVE Final   Influenza B by PCR NEGATIVE NEGATIVE Final    Comment: (NOTE) The Xpert Xpress SARS-CoV-2/FLU/RSV plus assay is intended as an aid in the diagnosis of influenza from Nasopharyngeal swab specimens and should not be used as a sole basis for treatment. Nasal washings and aspirates are unacceptable for Xpert Xpress  SARS-CoV-2/FLU/RSV testing.  Fact Sheet for Patients: BloggerCourse.com  Fact Sheet for Healthcare Providers: SeriousBroker.it  This test is not yet approved or cleared by the [place] FDA and has been authorized for detection and/or diagnosis of SARS-CoV-2 by FDA under an Emergency Use Authorization (EUA). This EUA will remain in effect (meaning this test can be used) for the duration of the COVID-19 declaration under Section 564(b)(1) of the Act, 21 U.S.C. section 360bbb-3(b)(1), unless the authorization is terminated or revoked.  Performed at [facility], [address] [place], [place] [address]     Procedures and diagnostic studies:  CT HEAD WO CONTRAST ( )  Result Date: 02/06/2021 CLINICAL DATA:  Status post fall. EXAM: CT HEAD WITHOUT CONTRAST CT CERVICAL SPINE WITHOUT CONTRAST TECHNIQUE: Multidetector CT imaging of the head and cervical spine was performed following the standard protocol without intravenous contrast. Multiplanar CT image reconstructions of the cervical spine were also generated. COMPARISON:  CT head and C-spine 11/24/2016 FINDINGS: CT HEAD FINDINGS BRAIN: BRAIN Patchy and confluent areas of decreased attenuation are noted throughout the deep and periventricular white matter of the cerebral hemispheres bilaterally, compatible with chronic microvascular ischemic disease. No evidence of large-territorial acute infarction. No parenchymal hemorrhage. No mass lesion. No extra-axial collection. No mass effect or midline shift. No hydrocephalus. Basilar cisterns are patent. Vascular: No hyperdense vessel. Atherosclerotic calcifications are present within the cavernous internal carotid arteries. Skull: No acute fracture or focal lesion. Sinuses/Orbits: Paranasal sinuses and mastoid air cells are clear. Right lens replacement. Otherwise orbits are unremarkable. Other: None. CT CERVICAL SPINE FINDINGS  Alignment: Normal. Skull base and vertebrae: Multilevel degenerative changes of the spine in a patient status post anterior and interbody fusion surgical hardware at the C3 through C5 levels. C4 corpectomy. No severe osseous neural foraminal or central canal stenosis. No acute fracture. No aggressive appearing focal osseous lesion or focal pathologic process. Soft tissues and spinal canal: No prevertebral fluid or swelling. No visible canal  hematoma. Upper chest: Biapical pleural/pulmonary scarring. Paraseptal emphysematous changes. Other: None. IMPRESSION: 1. No acute intracranial abnormality. 2. No acute displaced fracture or traumatic listhesis of the cervical spine in a patient status post C3 - C5 anterior and interbody fusion. Electronically Signed   By: Tish Frederickson M.D.   On: 02/06/2021 18:11   CT Cervical Spine Wo Contrast  Result Date: 02/06/2021 CLINICAL DATA:  Status post fall. EXAM: CT HEAD WITHOUT CONTRAST CT CERVICAL SPINE WITHOUT CONTRAST TECHNIQUE: Multidetector CT imaging of the head and cervical spine was performed following the standard protocol without intravenous contrast. Multiplanar CT image reconstructions of the cervical spine were also generated. COMPARISON:  CT head and C-spine 11/24/2016 FINDINGS: CT HEAD FINDINGS BRAIN: BRAIN Patchy and confluent areas of decreased attenuation are noted throughout the deep and periventricular white matter of the cerebral hemispheres bilaterally, compatible with chronic microvascular ischemic disease. No evidence of large-territorial acute infarction. No parenchymal hemorrhage. No mass lesion. No extra-axial collection. No mass effect or midline shift. No hydrocephalus. Basilar cisterns are patent. Vascular: No hyperdense vessel. Atherosclerotic calcifications are present within the cavernous internal carotid arteries. Skull: No acute fracture or focal lesion. Sinuses/Orbits: Paranasal sinuses and mastoid air cells are clear. Right lens  replacement. Otherwise orbits are unremarkable. Other: None. CT CERVICAL SPINE FINDINGS Alignment: Normal. Skull base and vertebrae: Multilevel degenerative changes of the spine in a patient status post anterior and interbody fusion surgical hardware at the C3 through C5 levels. C4 corpectomy. No severe osseous neural foraminal or central canal stenosis. No acute fracture. No aggressive appearing focal osseous lesion or focal pathologic process. Soft tissues and spinal canal: No prevertebral fluid or swelling. No visible canal hematoma. Upper chest: Biapical pleural/pulmonary scarring. Paraseptal emphysematous changes. Other: None. IMPRESSION: 1. No acute intracranial abnormality. 2. No acute displaced fracture or traumatic listhesis of the cervical spine in a patient status post C3 - C5 anterior and interbody fusion. Electronically Signed   By: Tish Frederickson M.D.   On: 02/06/2021 18:11   US Carotid Bilateral  Result Date: 02/07/2021 CLINICAL DATA:  Syncope EXAM: BILATERAL CAROTID DUPLEX ULTRASOUND TECHNIQUE: Wallace Cullens scale imaging, color Doppler and duplex ultrasound were performed of bilateral carotid and vertebral arteries in the neck. COMPARISON:  None. FINDINGS: Criteria: Quantification of carotid stenosis is based on velocity parameters that correlate the residual internal carotid diameter with NASCET-based stenosis levels, using the diameter of the distal internal carotid lumen as the denominator for stenosis measurement. The following velocity measurements were obtained: RIGHT ICA: 66/25 cm/sec CCA: 69/14 cm/sec SYSTOLIC ICA/CCA RATIO:  1.0 ECA:  107 cm/sec LEFT ICA: 84/28 cm/sec CCA: 99/20 cm/sec SYSTOLIC ICA/CCA RATIO:  0.8 ECA:  84 cm/sec RIGHT CAROTID ARTERY: Mild heterogeneous atherosclerotic plaque at the carotid bifurcation extending into the origin of the internal carotid artery. By peak systolic velocity criteria, the estimated stenosis is less than 50%. RIGHT VERTEBRAL ARTERY:  Patent with normal  antegrade flow. LEFT CAROTID ARTERY: Heterogeneous atherosclerotic plaque in the proximal internal carotid artery. By peak systolic velocity criteria, the estimated stenosis is less than 50%. LEFT VERTEBRAL ARTERY:  Patent with normal antegrade flow. IMPRESSION: 1. Mild (1-49%) stenosis proximal right internal carotid artery secondary to mild focal heterogeneous atherosclerotic plaque. 2. Mild (1-49%) stenosis proximal left internal carotid artery secondary to heterogenous atherosclerotic plaque. 3. Vertebral arteries are patent with normal antegrade flow. Signed, Sterling Big, MD, RPVI [facility] Electronically Signed   By: Malachy Moan M.D.   On:  02/07/2021 09:53   DG HIP UNILAT WITH PELVIS 2-3 VIEWS RIGHT  Result Date: 02/07/2021 CLINICAL DATA:  Status post fall. EXAM: DG HIP (WITH OR WITHOUT PELVIS) 2-3V RIGHT COMPARISON:  None. FINDINGS: There is no evidence of hip fracture or dislocation. Degenerative changes are seen in the form of joint space narrowing and acetabular sclerosis. There is mild vascular calcification. IMPRESSION: No acute osseous injury. Electronically Signed   By: Aram Candela M.D.   On: 02/07/2021 01:38               LOS: 1 day   Juan Harper  [facility]   Pager on [email or web address]. If 7PM-7AM, please contact night-coverage at [email or web address]     02/07/2021, 12:42 PM

## 2021-02-07 NOTE — ED Notes (Signed)
Adjusted pt's HOB.

## 2021-02-07 NOTE — ED Notes (Signed)
Patient provided 2nd nutritional tray/ x 2 juices

## 2021-02-07 NOTE — ED Notes (Signed)
Changed pt's linen. Placed pt's belongings (sandals, white tee shirt, underwears, shorts, keys) in white bag at bedside, pt changed into hospital gown as linen and clothing were wet

## 2021-02-07 NOTE — ED Notes (Signed)
Patient's brother here at bedside with clothes stating he is taking patient. Patient signed AMA. MD notified.

## 2021-02-07 NOTE — ED Notes (Signed)
Patient upset stating he is not being taken care of properly and is going to leave to go to [facility]. Patient reports not seeing a doctor today and no one is feeding him. Patient was given sandwich tray while waiting for dinner tray. MD paged.

## 2021-02-07 NOTE — Evaluation (Signed)
Physical Therapy Evaluation Patient Details Name: Juan Harper MRN: [id number] DOB: 11-26-1960 Today's Date: 02/07/2021  History of Present Illness  Juan Harper is a 60 y.o. male with medical history significant for type 2 diabetes mellitus, essential hypertension, chronic diastolic heart failure, who is admitted to [facility] on 02/06/2021  for further evaluation management of syncope after presenting from home to [facility] ED for evaluation of loss of consciousness and associated falls.  Clinical Impression  Pt is a pleasant 60 year old male who presents to PT evaluation s/p fall in home and concerns for dizziness and lightheadedness. Prior to admission, pt required assistance with ADLs and is modified independent with mobility using a RW. Pt reports having an aide come to his house each day for 2-3 hours to assist with ADLs and IADLs. Upon evaluation, pt required modA for bed mobility, and CGA for standing with RW due to symptomatic orthostatic hypotension. Pt presents with orthostatic hypotension, decreased activity tolerance, impaired sensation and impaired balance. Recommending SNF placement to improve impairments listed above however, pt may likely decline this recommendation. Pt will continue to benefit from skilled PT services to reduce likelihood of falls and balance with mobility.      Recommendations for follow up therapy are one component of a multi-disciplinary discharge planning process, led by the attending physician.  Recommendations may be updated based on patient status, additional functional criteria and insurance authorization.  Follow Up Recommendations SNF;Supervision for mobility/OOB    Equipment Recommendations  Rolling walker with 5" wheels    Recommendations for Other Services       Precautions / Restrictions Precautions Precautions: Fall Restrictions Weight Bearing Restrictions: No      Mobility  Bed Mobility Overal bed mobility: Needs  Assistance Bed Mobility: Supine to Sit;Sit to Supine     Supine to sit: Mod assist Sit to supine: Min assist   General bed mobility comments: ModA for at the trunk with supine to sit. Cued to assist with usage of elbows. MinA for management of B LEs back into bed    Transfers Overall transfer level: Needs assistance Equipment used: Rolling walker (2 wheeled) Transfers: Sit to/from Stand Sit to Stand: Min guard         General transfer comment: CGA for safety due to concerns of symptomatic orthostatic hypotension  Ambulation/Gait Ambulation/Gait assistance: Min guard Gait Distance (Feet): 3 Feet Assistive device: Rolling walker (2 wheeled)       General Gait Details: Sidestepping at EOB for positioning in bed. Pt required assistance with movement of RW and cues for sequencing  Stairs            Wheelchair Mobility    Modified Rankin (Stroke Patients Only)       Balance Overall balance assessment: Needs assistance Sitting-balance support: Feet supported;Single extremity supported Sitting balance-Leahy Scale: Fair Sitting balance - Comments: Pt symptomatic in sitting position with concerns of lightheadedness requiring UE support to maintain seated balance.   Standing balance support: Bilateral upper extremity supported Standing balance-Leahy Scale: Fair Standing balance comment: Symptomatic in standing after 3 minutes requiring B UE assistance to maintain balance           Pertinent Vitals/Pain Pain Assessment: 0-10 Pain Score: 10-Worst pain ever Pain Location: Generalized; Unable to pin point pain location Pain Intervention(s): Limited activity within patient's tolerance;Monitored during session;Repositioned    Home Living Family/patient expects to be discharged to:: Private residence Living Arrangements: Alone Available Help at Discharge: Other (Comment) (None) Type  of Home: House Home Access: Stairs to enter Entrance Stairs-Rails: None Entrance  Stairs-Number of Steps: 4 Home Layout: One level Home Equipment: Walker - 2 wheels Additional Comments: Pt reports no family or friends would be available to assist him at discharge    Prior Function Level of Independence: Needs assistance   Gait / Transfers Assistance Needed: Household ambulation mostly with usage of RW     Comments: Pt reports having a home health aide come every day for 2-3 hours to assist with ADLs and IADLs     Hand Dominance        Extremity/Trunk Assessment   Upper Extremity Assessment Upper Extremity Assessment: Generalized weakness (Decreased dexterity at baseline due to diabetic neuropathy)    Lower Extremity Assessment Lower Extremity Assessment: Generalized weakness;RLE deficits/detail;LLE deficits/detail RLE Deficits / Details: Decreased knee extension ROM. Grossly 5/5 MMT RLE Sensation: history of peripheral neuropathy;decreased light touch LLE Deficits / Details: Decreased knee extension ROM. Grossly 5/5 MMT LLE Sensation: history of peripheral neuropathy;decreased light touch       Communication   Communication: No difficulties  Cognition Arousal/Alertness: Awake/alert Behavior During Therapy: WFL for tasks assessed/performed Overall Cognitive Status: Within Functional Limits for tasks assessed           General Comments      Exercises Other Exercises Other Exercises: Orthostatics taken throughout mobility assessment. See vital signs flowsheet for measurements. Other Exercises: Pt assisted with food tray at end of session due to poor dexterity in hands.   Assessment/Plan    PT Assessment Patient needs continued PT services  PT Problem List Decreased activity tolerance;Decreased balance;Decreased mobility;Cardiopulmonary status limiting activity       PT Treatment Interventions DME instruction;Gait training;Stair training;Functional mobility training;Therapeutic activities;Therapeutic exercise;Balance training;Neuromuscular  re-education    PT Goals (Current goals can be found in the Care Plan section)  Acute Rehab PT Goals Patient Stated Goal: to figure out what is causing his dizziness and to fall less PT Goal Formulation: With patient Time For Goal Achievement: 02/21/21 Potential to Achieve Goals: Fair    Frequency Min 2X/week   Barriers to discharge Decreased caregiver support      Co-evaluation               AM-PAC PT "6 Clicks" Mobility  Outcome Measure Help needed turning from your back to your side while in a flat bed without using bedrails?: A Little Help needed moving from lying on your back to sitting on the side of a flat bed without using bedrails?: A Lot Help needed moving to and from a bed to a chair (including a wheelchair)?: A Lot Help needed standing up from a chair using your arms (e.g., wheelchair or bedside chair)?: A Lot Help needed to walk in hospital room?: A Lot Help needed climbing 3-5 steps with a railing? : A Lot 6 Click Score: 13    End of Session Equipment Utilized During Treatment: Gait belt Activity Tolerance: Patient tolerated treatment well Patient left: in bed;with call bell/phone within reach;Other (comment) (Food tray over lap) Nurse Communication: Mobility status PT Visit Diagnosis: Unsteadiness on feet (R26.81);Dizziness and giddiness (R42);History of falling (Z91.81)    Time: 1345-1420 PT Time Calculation (min) (ACUTE ONLY): 35 min   Charges:   PT Evaluation $PT Eval Moderate Complexity: 1 Mod PT Treatments $Therapeutic Activity: 8-22 mins        Verl Blalock, SPT   Verl Blalock 02/07/2021, 4:07 PM

## 2021-02-07 NOTE — ED Notes (Signed)
Provided pt with pillow.

## 2021-06-11 ENCOUNTER — Encounter: Payer: Self-pay | Admitting: Ophthalmology

## 2021-06-12 ENCOUNTER — Encounter: Payer: Self-pay | Admitting: Ophthalmology

## 2021-06-21 NOTE — Discharge Instructions (Signed)

## 2021-06-24 ENCOUNTER — Ambulatory Visit
Admission: RE | Admit: 2021-06-24 | Discharge: 2021-06-24 | Disposition: A | Discharge status: Home / Self Care | Payer: Medicaid Other | Source: Ambulatory Visit | Attending: Ophthalmology | Admitting: Ophthalmology

## 2021-06-24 ENCOUNTER — Encounter
Admission: RE | Disposition: A | Discharge status: Home / Self Care | Payer: Self-pay | Source: Ambulatory Visit | Attending: Ophthalmology

## 2021-06-24 ENCOUNTER — Ambulatory Visit: Payer: Medicaid Other | Admitting: Anesthesiology

## 2021-06-24 ENCOUNTER — Other Ambulatory Visit: Payer: Self-pay

## 2021-06-24 DIAGNOSIS — E1136 Type 2 diabetes mellitus with diabetic cataract: Secondary | ICD-10-CM | POA: Diagnosis not present

## 2021-06-24 DIAGNOSIS — F17210 Nicotine dependence, cigarettes, uncomplicated: Secondary | ICD-10-CM | POA: Diagnosis not present

## 2021-06-24 DIAGNOSIS — I509 Heart failure, unspecified: Secondary | ICD-10-CM | POA: Diagnosis not present

## 2021-06-24 DIAGNOSIS — H2512 Age-related nuclear cataract, left eye: Secondary | ICD-10-CM | POA: Insufficient documentation

## 2021-06-24 DIAGNOSIS — J45909 Unspecified asthma, uncomplicated: Secondary | ICD-10-CM | POA: Diagnosis not present

## 2021-06-24 DIAGNOSIS — G4730 Sleep apnea, unspecified: Secondary | ICD-10-CM | POA: Diagnosis not present

## 2021-06-24 DIAGNOSIS — I110 Hypertensive heart disease with heart failure: Secondary | ICD-10-CM | POA: Insufficient documentation

## 2021-06-24 HISTORY — DX: Unspecified asthma, uncomplicated: J45.909

## 2021-06-24 HISTORY — PX: CATARACT EXTRACTION W/PHACO: SHX586

## 2021-06-24 HISTORY — DX: Unspecified hearing loss, unspecified ear: H91.90

## 2021-06-24 HISTORY — DX: Presence of dental prosthetic device (complete) (partial): Z97.2

## 2021-06-24 LAB — GLUCOSE, CAPILLARY
Glucose-Capillary: 246 mg/dL — ABNORMAL HIGH (ref 70–99)
Glucose-Capillary: 269 mg/dL — ABNORMAL HIGH (ref 70–99)

## 2021-06-24 SURGERY — PHACOEMULSIFICATION, CATARACT, WITH IOL INSERTION
Anesthesia: Monitor Anesthesia Care | Site: Eye | Laterality: Left

## 2021-06-24 MED ORDER — TETRACAINE HCL 0.5 % OP SOLN
1.0000 [drp] | OPHTHALMIC | Status: DC | PRN
  Administered 2021-06-24 (×3): 1 [drp] via OPHTHALMIC

## 2021-06-24 MED ORDER — LACTATED RINGERS IV SOLN: INTRAVENOUS | Status: DC

## 2021-06-24 MED ORDER — LIDOCAINE HCL (PF) 2 % IJ SOLN
INTRAOCULAR | Status: DC | PRN
  Administered 2021-06-24: 1 mL via INTRAOCULAR

## 2021-06-24 MED ORDER — SIGHTPATH DOSE#1 SODIUM HYALURONATE 23 MG/ML IO SOLUTION
PREFILLED_SYRINGE | INTRAOCULAR | Status: DC | PRN
  Administered 2021-06-24: 0.6 mL via INTRAOCULAR

## 2021-06-24 MED ORDER — SIGHTPATH DOSE#1 SODIUM HYALURONATE 10 MG/ML IO SOLUTION
PREFILLED_SYRINGE | INTRAOCULAR | Status: DC | PRN
  Administered 2021-06-24: 0.85 mL via INTRAOCULAR

## 2021-06-24 MED ORDER — MOXIFLOXACIN HCL 0.5 % OP SOLN
OPHTHALMIC | Status: DC | PRN
  Administered 2021-06-24: 0.2 mL via OPHTHALMIC

## 2021-06-24 MED ORDER — FENTANYL CITRATE (PF) 100 MCG/2ML IJ SOLN
INTRAMUSCULAR | Status: DC | PRN
  Administered 2021-06-24: 50 ug via INTRAVENOUS

## 2021-06-24 MED ORDER — SIGHTPATH DOSE#1 BSS IO SOLN
INTRAOCULAR | Status: DC | PRN
  Administered 2021-06-24: 76 mL via OPHTHALMIC

## 2021-06-24 MED ORDER — MIDAZOLAM HCL 2 MG/2ML IJ SOLN
INTRAMUSCULAR | Status: DC | PRN
  Administered 2021-06-24: 1 mg via INTRAVENOUS

## 2021-06-24 MED ORDER — SIGHTPATH DOSE#1 BSS IO SOLN
INTRAOCULAR | Status: DC | PRN
  Administered 2021-06-24: 15 mL

## 2021-06-24 MED ORDER — ARMC OPHTHALMIC DILATING DROPS
1.0000 | OPHTHALMIC | Status: DC | PRN
Start: 2021-06-24 — End: 2021-06-24
  Administered 2021-06-24 (×3): 1 via OPHTHALMIC

## 2021-06-24 SURGICAL SUPPLY — 20 items
CANNULA ANT/CHMB 27G (MISCELLANEOUS) IMPLANT
CANNULA ANT/CHMB 27GA (MISCELLANEOUS) IMPLANT
CATARACT SUITE SIGHTPATH (MISCELLANEOUS) ×2 IMPLANT
DISSECTOR HYDRO NUCLEUS 50X22 (MISCELLANEOUS) ×2 IMPLANT
FEE CATARACT SUITE SIGHTPATH (MISCELLANEOUS) ×1 IMPLANT
GLOVE SURG GAMMEX PI TX LF 7.5 (GLOVE) ×2 IMPLANT
GLOVE SURG SYN 8.5  E (GLOVE) ×1
GLOVE SURG SYN 8.5 E (GLOVE) ×1 IMPLANT
GLOVE SURG SYN 8.5 PF PI (GLOVE) ×1 IMPLANT
LENS IOL TECNIS EYHANCE 19.5 (Intraocular Lens) ×1 IMPLANT
NDL FILTER BLUNT 18X1 1/2 (NEEDLE) ×1 IMPLANT
NEEDLE FILTER BLUNT 18X 1/2SAF (NEEDLE) ×1
NEEDLE FILTER BLUNT 18X1 1/2 (NEEDLE) ×1 IMPLANT
PACK VIT ANT 23G (MISCELLANEOUS) IMPLANT
RING MALYGIN (MISCELLANEOUS) IMPLANT
SUT ETHILON 10-0 CS-B-6CS-B-6 (SUTURE)
SUTURE EHLN 10-0 CS-B-6CS-B-6 (SUTURE) IMPLANT
SYR 3ML LL SCALE MARK (SYRINGE) ×2 IMPLANT
SYR 5ML LL (SYRINGE) ×2 IMPLANT
WATER STERILE IRR 250ML POUR (IV SOLUTION) ×2 IMPLANT

## 2021-06-24 NOTE — Anesthesia Preprocedure Evaluation (Signed)
Anesthesia Evaluation  Patient identified by MRN, date of birth, ID band Patient awake    Reviewed: Allergy & Precautions, H&P , NPO status , Patient's Chart, lab work & pertinent test results  Airway Mallampati: II  TM Distance: >3 FB Neck ROM: full    Dental no notable dental hx.    Pulmonary asthma , sleep apnea and Continuous Positive Airway Pressure Ventilation , Current Smoker and Patient abstained from smoking.,    Pulmonary exam normal breath sounds clear to auscultation       Cardiovascular hypertension, +CHF  Normal cardiovascular exam Rhythm:regular Rate:Normal     Neuro/Psych    GI/Hepatic   Endo/Other  diabetes  Renal/GU Renal disease     Musculoskeletal   Abdominal   Peds  Hematology   Anesthesia Other Findings   Reproductive/Obstetrics                             Anesthesia Physical Anesthesia Plan  ASA: 3  Anesthesia Plan: MAC   Post-op Pain Management: Minimal or no pain anticipated   Induction:   PONV Risk Score and Plan: Treatment may vary due to age or medical condition, TIVA and Midazolam  Airway Management Planned:   Additional Equipment:   Intra-op Plan:   Post-operative Plan:   Informed Consent: I have reviewed the patients History and Physical, chart, labs and discussed the procedure including the risks, benefits and alternatives for the proposed anesthesia with the patient or authorized representative who has indicated his/her understanding and acceptance.     Dental Advisory Given  Plan Discussed with: CRNA  Anesthesia Plan Comments:         Anesthesia Quick Evaluation

## 2021-06-24 NOTE — H&P (Signed)
[facility]   Primary Care Physician:  Juan Harper, Juan B, NP Ophthalmologist: Dr. Willey BladeBradley Amine Harper  Pre-Procedure History & Physical: HPI:  Juan RouseHenry E Harper is a 61 y.o. male here for cataract surgery.   Past Medical History:  Diagnosis Date   Arthritis    NECK/ RIGHT KNEE   Asthma    Bell's palsy    BPH (benign prostatic hyperplasia)    Cough    chronic   Diabetes mellitus without complication (HCC)    Edema    legs/feet   Fusion of spine of cervical region    PLANNED FOR APRIL   HOH (hard of hearing)    Hypertension    Orthopnea    Shortness of breath dyspnea    Sleep apnea    cpap   Wears dentures    partial upper and lower   Wheezing     Past Surgical History:  Procedure Laterality Date   BACK SURGERY     CATARACT EXTRACTION W/PHACO Right 07/31/2016   Procedure: CATARACT EXTRACTION PHACO AND INTRAOCULAR LENS PLACEMENT (IOC);  Surgeon: Juan CraneBradley Mark Raydell Maners, MD;  Location: [facility] ORS;  Service: Ophthalmology;  Laterality: Right;  Lot # [id number] H US:00:26.9 AP%:6.8% CDE: 1.83   COLONOSCOPY     NECK MASS EXCISION     PILONIDAL CYST EXCISION      Prior to Admission medications   Medication Sig Start Date End Date Taking? Authorizing Provider  albuterol (PROVENTIL HFA;VENTOLIN HFA) 108 (90 Base) MCG/ACT inhaler Inhale 2 puffs into the lungs every 6 (six) hours as needed for wheezing or shortness of breath.   Yes [provider]  allopurinol (ZYLOPRIM) 100 MG tablet Take 100 mg by mouth daily. 03/01/20  Yes [provider]  amLODipine (NORVASC) 10 MG tablet Take 10 mg by mouth daily. 03/01/20  Yes [provider]  aspirin EC 81 MG tablet Take 81 mg by mouth daily.   Yes [provider]  atorvastatin (LIPITOR) 40 MG tablet Take 40 mg by mouth daily.   Yes [provider]  chlorthalidone (HYGROTON) 25 MG tablet Take 25 mg by mouth daily. 03/01/20  Yes [provider]  FARXIGA 10 MG TABS tablet Take 10 mg by mouth daily.  02/02/21  Yes [provider]  ferrous sulfate 325 (65 FE) MG tablet Take 325 mg by mouth daily.   Yes [provider]  finasteride (PROSCAR) 5 MG tablet Take 5 mg by mouth daily. 03/01/20  Yes [provider]  gabapentin (NEURONTIN) 300 MG capsule Take 300 mg by mouth at bedtime. 08/17/20  Yes [provider]  GVOKE HYPOPEN 2-PACK 1 MG/0.2ML SOAJ  07/31/20  Yes [provider]  insulin detemir (LEVEMIR FLEXTOUCH) 100 UNIT/ML FlexPen Inject 60 Units into the skin at bedtime. 04/01/19  Yes [provider]  ipratropium (ATROVENT HFA) 17 MCG/ACT inhaler Inhale 2 puffs into the lungs every 6 (six) hours.   Yes [provider]  lisinopril-hydrochlorothiazide (ZESTORETIC) 20-12.5 MG tablet Take 1 tablet by mouth daily.   Yes [provider]  loratadine (CLARITIN) 10 MG tablet Take 10 mg by mouth daily.   Yes [provider]  Melatonin 5 MG CAPS Take 1 capsule by mouth daily.   Yes [provider]  metFORMIN (GLUCOPHAGE) 500 MG tablet Take 2 tablets (1,000 mg total) by mouth 2 (two) times daily. 09/05/16  Yes Juan Harper, Patrick, MD  NOVOLOG FLEXPEN 100 UNIT/ML FlexPen Inject 0-6 Units into the skin 3 (three) times daily before meals. Per  sliding scale 12/31/20  Yes [provider]  potassium chloride (KLOR-CON) 10 MEQ tablet Take 10 mEq by mouth daily. 01/17/20  Yes [provider]  sitaGLIPtin (JANUVIA) 100 MG tablet Take 100 mg by mouth daily.   Yes [provider]  tamsulosin (FLOMAX) 0.4 MG CAPS capsule Take 0.8 mg by mouth every morning.    Yes [provider]  traZODone (DESYREL) 50 MG tablet Take 100 mg by mouth at bedtime. 03/01/20  Yes [provider]  vitamin Harper-12 (CYANOCOBALAMIN) 1000 MCG tablet Take 1,000 mcg by mouth daily. 08/17/20  Yes [provider]  Vitamin D, Ergocalciferol, (DRISDOL) 1.25 MG (50000 UNIT) CAPS capsule Take 50,000 Units by mouth every 7  (seven) days. 08/17/20  Yes [provider]  YUPELRI 175 MCG/3ML nebulizer solution Take 175 mcg by nebulization daily. 10/31/19  Yes [provider]  acetaminophen (TYLENOL) 325 MG tablet Take 650 mg by mouth 3 (three) times daily. Patient not taking: Reported on 02/07/2021    [provider]  budesonide-formoterol (SYMBICORT) 80-4.5 MCG/ACT inhaler Inhale 2 puffs into the lungs 2 (two) times daily.    [provider]  diclofenac sodium (VOLTAREN) 1 % GEL Apply 2 g topically 2 (two) times daily. Patient not taking: Reported on 02/07/2021    [provider]  ferrous sulfate (SLOW FE) 160 (50 Fe) MG TBCR SR tablet Take 160 mg by mouth daily. Patient not taking: No sig reported 07/19/20   [provider]  gabapentin (NEURONTIN) 100 MG capsule Take 100 mg by mouth daily. 02/02/21   [provider]  lisinopril (ZESTRIL) 20 MG tablet Take 20 mg by mouth daily. Patient not taking: Reported on 06/11/2021    [provider]  oxycodone (OXY-IR) 5 MG capsule Take 5 mg by mouth every 6 (six) hours as needed. Patient not taking: No sig reported    [provider]    Allergies as of 05/29/2021 - Review Complete 02/07/2021  Allergen Reaction Noted   Bee venom Itching 10/25/2020    Family History  Problem Relation Age of Onset   Hypertension Mother     Social History   Socioeconomic History   Marital status: Single    Spouse name: Not on file   Number of children: Not on file   Years of education: Not on file   Highest education level: Not on file  Occupational History   Not on file  Tobacco Use   Smoking status: Every Day    Packs/day: 0.50    Years: 48.00    Pack years: 24.00    Types: Cigarettes   Smokeless tobacco: Never   Tobacco comments:    Started age 77  Vaping Use   Vaping Use: Never used  Substance and Sexual Activity   Alcohol use: No   Drug use: Yes    Types: Cocaine, Marijuana    Comment:  06/12/21  Pt states no marajuana for over 1 month.  Has never used cocaine, but does "handle it" sometimes.  Not in last month)   Sexual activity: Not on file  Other Topics Concern   Not on file  Social History Narrative   Not on file   Social Determinants of Health   Financial Resource Strain: Not on file  Food Insecurity: Not on file  Transportation Needs: Not on file  Physical Activity: Not on file  Stress: Not on file  Social Connections: Not on file  Intimate Partner Violence: Not on file  Review of Systems: See HPI, otherwise negative ROS  Physical Exam: BP (!) 159/90    Pulse 84    Temp (!) 97 F (36.1 C) (Temporal)    Resp 20    Ht 5\' 10"  (1.778 m)    Wt 102.1 kg    SpO2 100%    BMI 32.28 kg/m  General:   Alert, cooperative in NAD Head:  Normocephalic and atraumatic. Respiratory:  Normal work of breathing. Cardiovascular:  RRR  Impression/Plan: is here for cataract surgery.  Risks, benefits, limitations, and alternatives regarding cataract surgery have been reviewed with the patient.  Questions have been answered.  All parties agreeable.   Juan Rouse, MD  06/24/2021, 7:59 AM

## 2021-06-24 NOTE — Anesthesia Postprocedure Evaluation (Signed)
Anesthesia Post Note  Patient: Juan Harper  Procedure(s) Performed: CATARACT EXTRACTION PHACO AND INTRAOCULAR LENS PLACEMENT (IOC) LEFT DIABETIC (Left: Eye)     Patient location during evaluation: PACU Anesthesia Type: MAC Level of consciousness: awake and alert and oriented Pain management: satisfactory to patient Vital Signs Assessment: post-procedure vital signs reviewed and stable Respiratory status: spontaneous breathing, nonlabored ventilation and respiratory function stable Cardiovascular status: blood pressure returned to baseline and stable Postop Assessment: Adequate PO intake and No signs of nausea or vomiting Anesthetic complications: no   No notable events documented.  Raliegh Ip

## 2021-06-24 NOTE — Transfer of Care (Signed)
Immediate Anesthesia Transfer of Care Note  Patient: Juan Harper  Procedure(s) Performed: CATARACT EXTRACTION PHACO AND INTRAOCULAR LENS PLACEMENT (IOC) LEFT DIABETIC (Left: Eye)  Patient Location: PACU  Anesthesia Type: MAC  Level of Consciousness: awake, alert  and patient cooperative  Airway and Oxygen Therapy: Patient Spontanous Breathing and Patient connected to supplemental oxygen  Post-op Assessment: Post-op Vital signs reviewed, Patient's Cardiovascular Status Stable, Respiratory Function Stable, Patent Airway and No signs of Nausea or vomiting  Post-op Vital Signs: Reviewed and stable  Complications: No notable events documented.

## 2021-06-24 NOTE — Anesthesia Procedure Notes (Signed)
Procedure Name: MAC Date/Time: 06/24/2021 8:05 AM Performed by: Cameron Ali, CRNA Pre-anesthesia Checklist: Patient identified, Emergency Drugs available, Suction available, Timeout performed and Patient being monitored Patient Re-evaluated:Patient Re-evaluated prior to induction Oxygen Delivery Method: Nasal cannula Placement Confirmation: positive ETCO2

## 2021-06-24 NOTE — Op Note (Signed)
OPERATIVE NOTE  TYAIRE LUQUIN IM:[id number] 06/24/2021   PREOPERATIVE DIAGNOSIS:  Nuclear sclerotic cataract left eye.  H25.12   POSTOPERATIVE DIAGNOSIS:    Nuclear sclerotic cataract left eye.     PROCEDURE:  Phacoemusification with posterior chamber intraocular lens placement of the left eye   LENS:   Implant Name Type Inv. Item Serial No. Manufacturer Lot No. LRB No. Used Action  LENS IOL TECNIS EYHANCE 19.5 - [id number] Intraocular Lens LENS IOL TECNIS EYHANCE 19.5 [id number] [employer]  Left 1 Implanted      Procedure(s) with comments: CATARACT EXTRACTION PHACO AND INTRAOCULAR LENS PLACEMENT (IOC) LEFT DIABETIC (Left) - 3.35 0:23.9  DIB00 +19.5   ULTRASOUND TIME: 0 minutes 23 seconds.  CDE 3.35   SURGEON:  Benay Pillow, MD, MPH   ANESTHESIA:  Topical with tetracaine drops augmented with 1% preservative-free intracameral lidocaine.  ESTIMATED BLOOD LOSS: <1 mL   COMPLICATIONS:  None.   DESCRIPTION OF PROCEDURE:  The patient was identified in the holding room and transported to the operating room and placed in the supine position under the operating microscope.  The left eye was identified as the operative eye and it was prepped and draped in the usual sterile ophthalmic fashion.   A 1.0 millimeter clear-corneal paracentesis was made at the 5:00 position. 0.5 ml of preservative-free 1% lidocaine with epinephrine was injected into the anterior chamber.  The anterior chamber was filled with Healon 5 viscoelastic.  A 2.4 millimeter keratome was used to make a near-clear corneal incision at the 2:00 position.  A curvilinear capsulorrhexis was made with a cystotome and capsulorrhexis forceps.  Balanced salt solution was used to hydrodissect and hydrodelineate the nucleus.   Phacoemulsification was then used in stop and chop fashion to remove the lens nucleus and epinucleus.  The remaining cortex was then removed using the irrigation and aspiration handpiece. Healon was then placed into  the capsular bag to distend it for lens placement.  A lens was then injected into the capsular bag.  The remaining viscoelastic was aspirated.   Wounds were hydrated with balanced salt solution.  The anterior chamber was inflated to a physiologic pressure with balanced salt solution.  Intracameral vigamox 0.1 mL undiltued was injected into the eye and a drop placed onto the ocular surface.  No wound leaks were noted.  The patient was taken to the recovery room in stable condition without complications of anesthesia or surgery  Benay Pillow 06/24/2021, 8:25 AM

## 2021-06-25 ENCOUNTER — Encounter: Payer: Self-pay | Admitting: Ophthalmology

## 2021-12-27 IMAGING — CR DG HIP (WITH OR WITHOUT PELVIS) 2-3V*R*
3 series · 3 of 3 positions shown · non-contrast
Comparison: None.

CLINICAL DATA: Status post fall.

EXAM:
DG HIP (WITH OR WITHOUT PELVIS) 2-3V RIGHT

[pelvis ap]
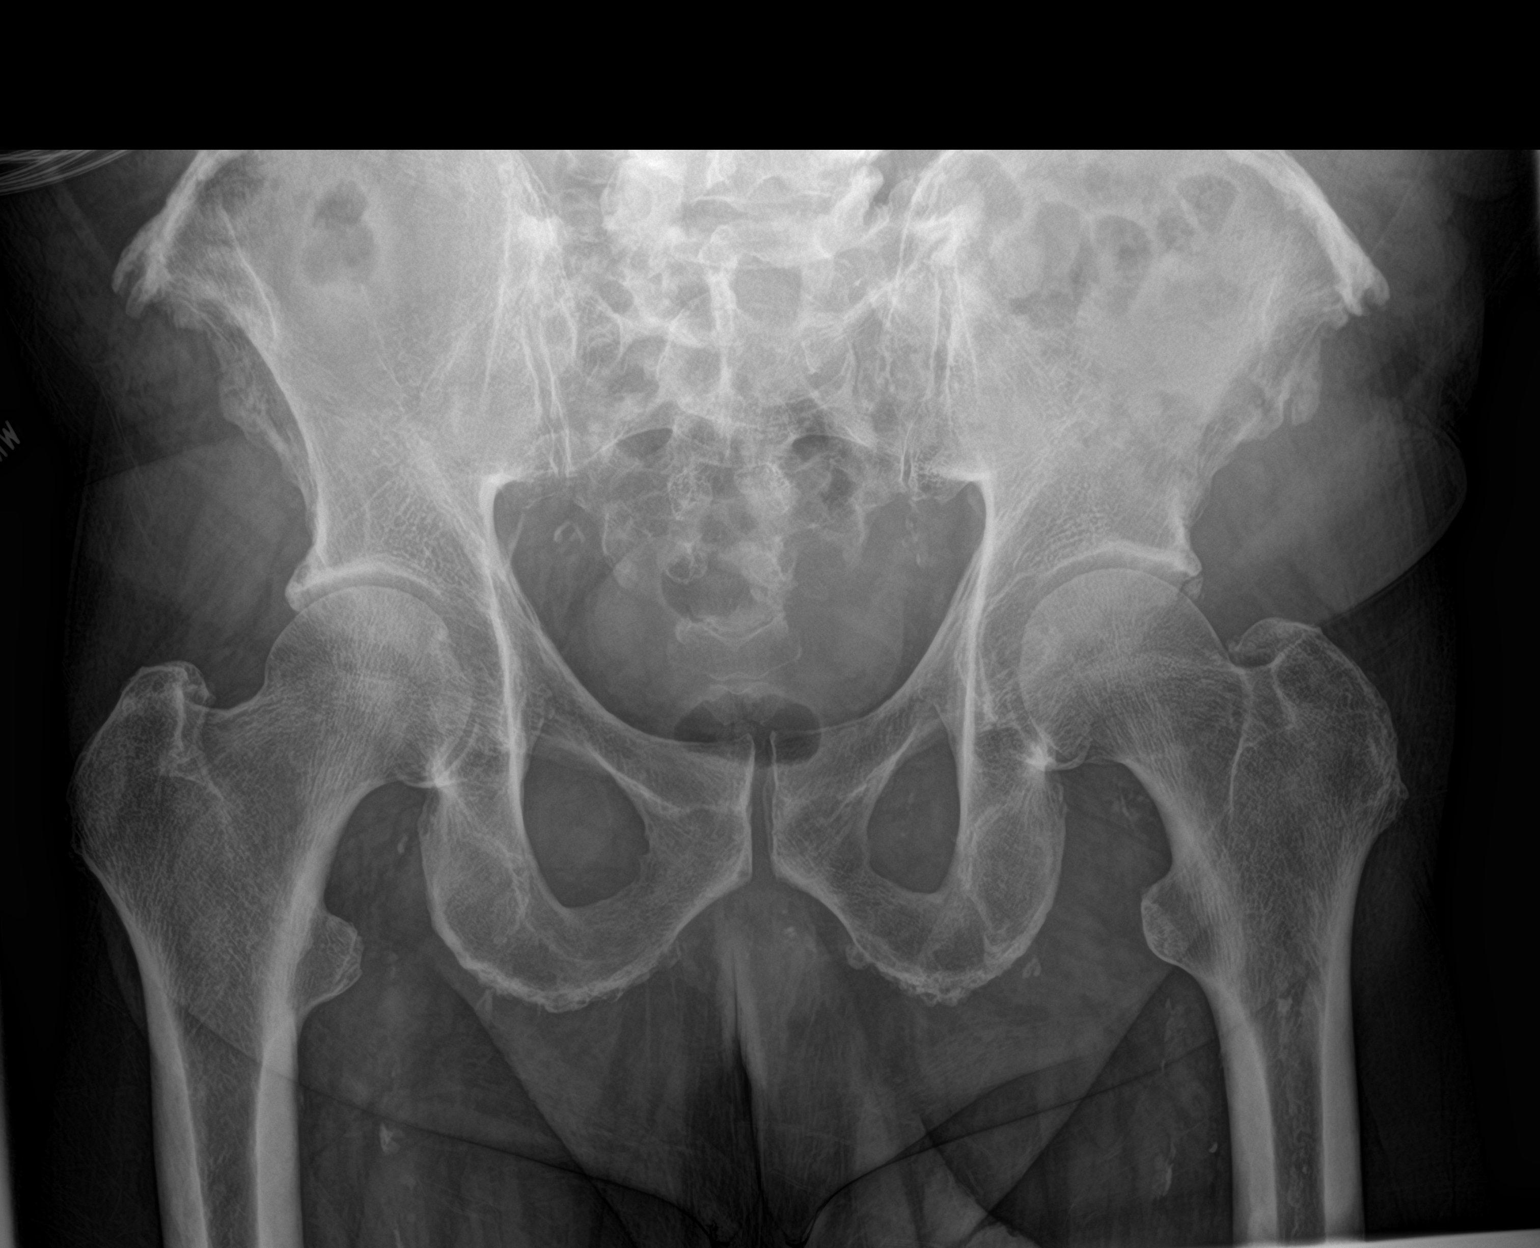

[hip ap]
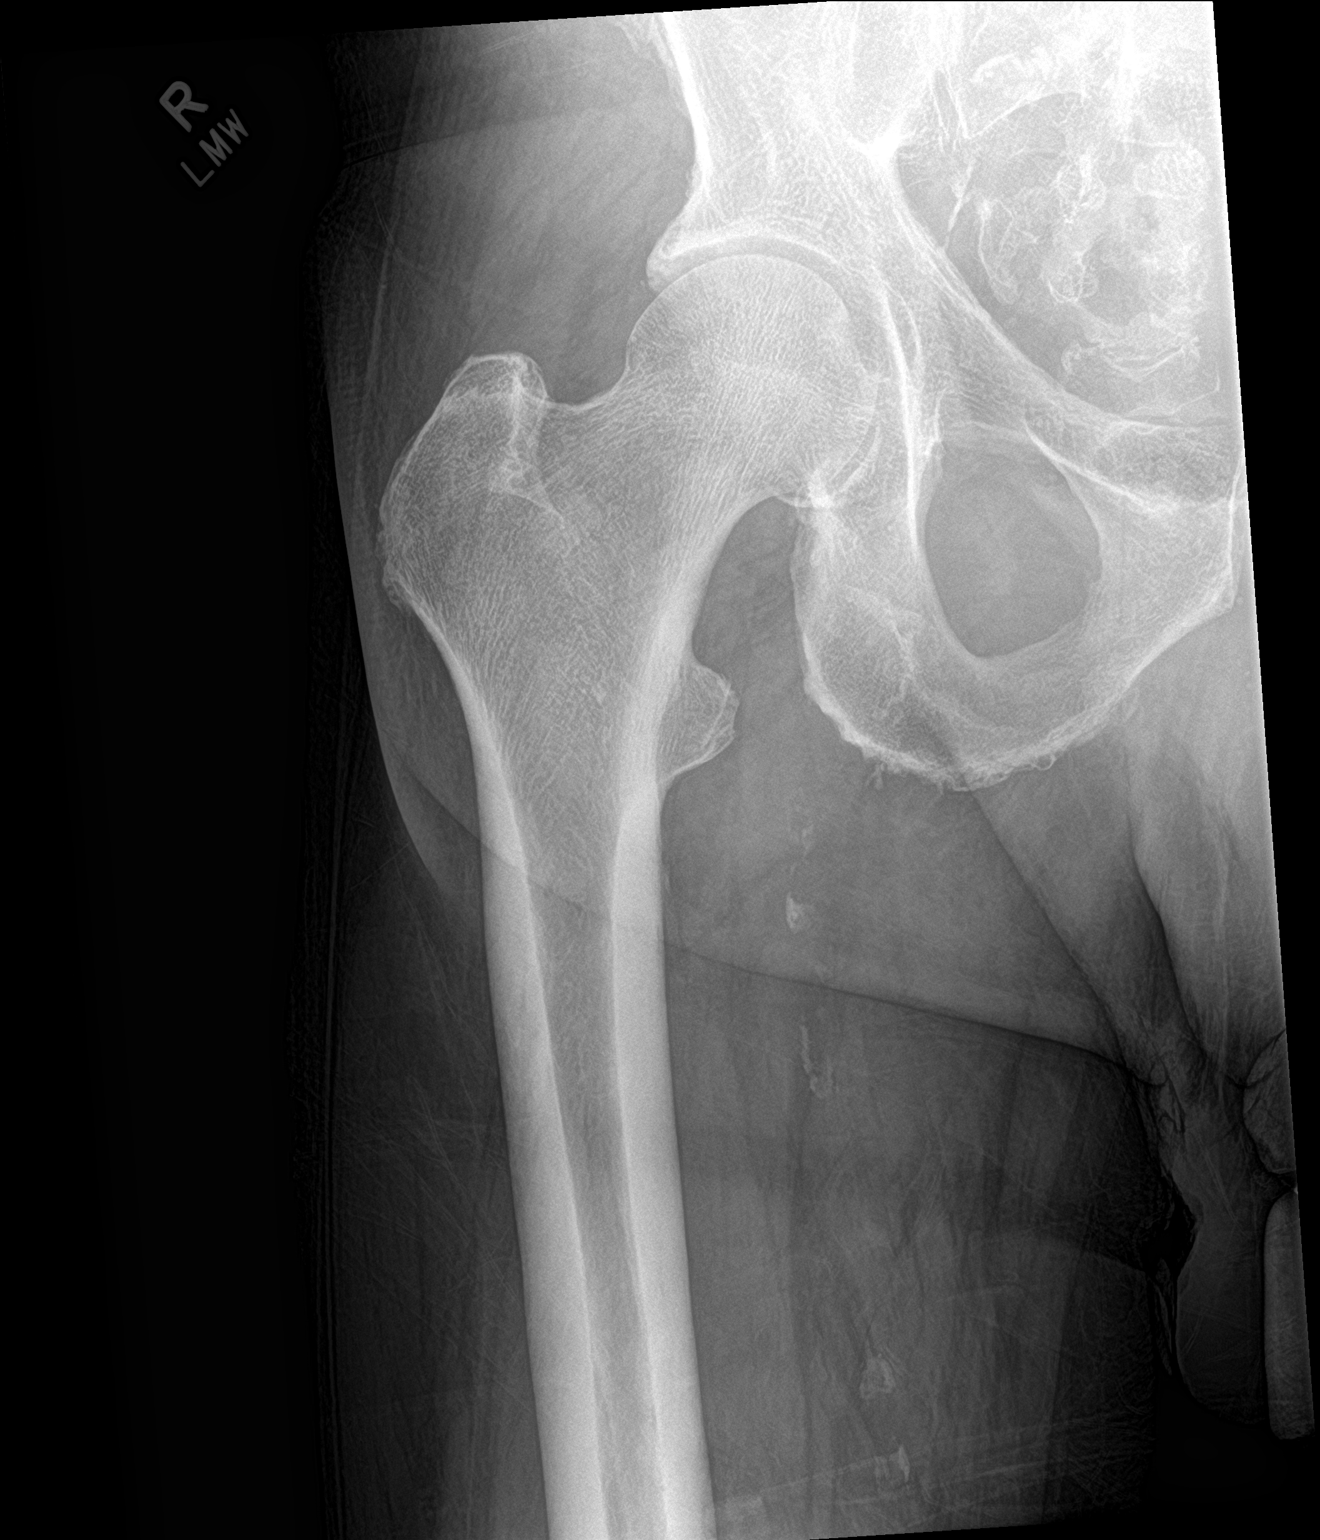

[hip lat]
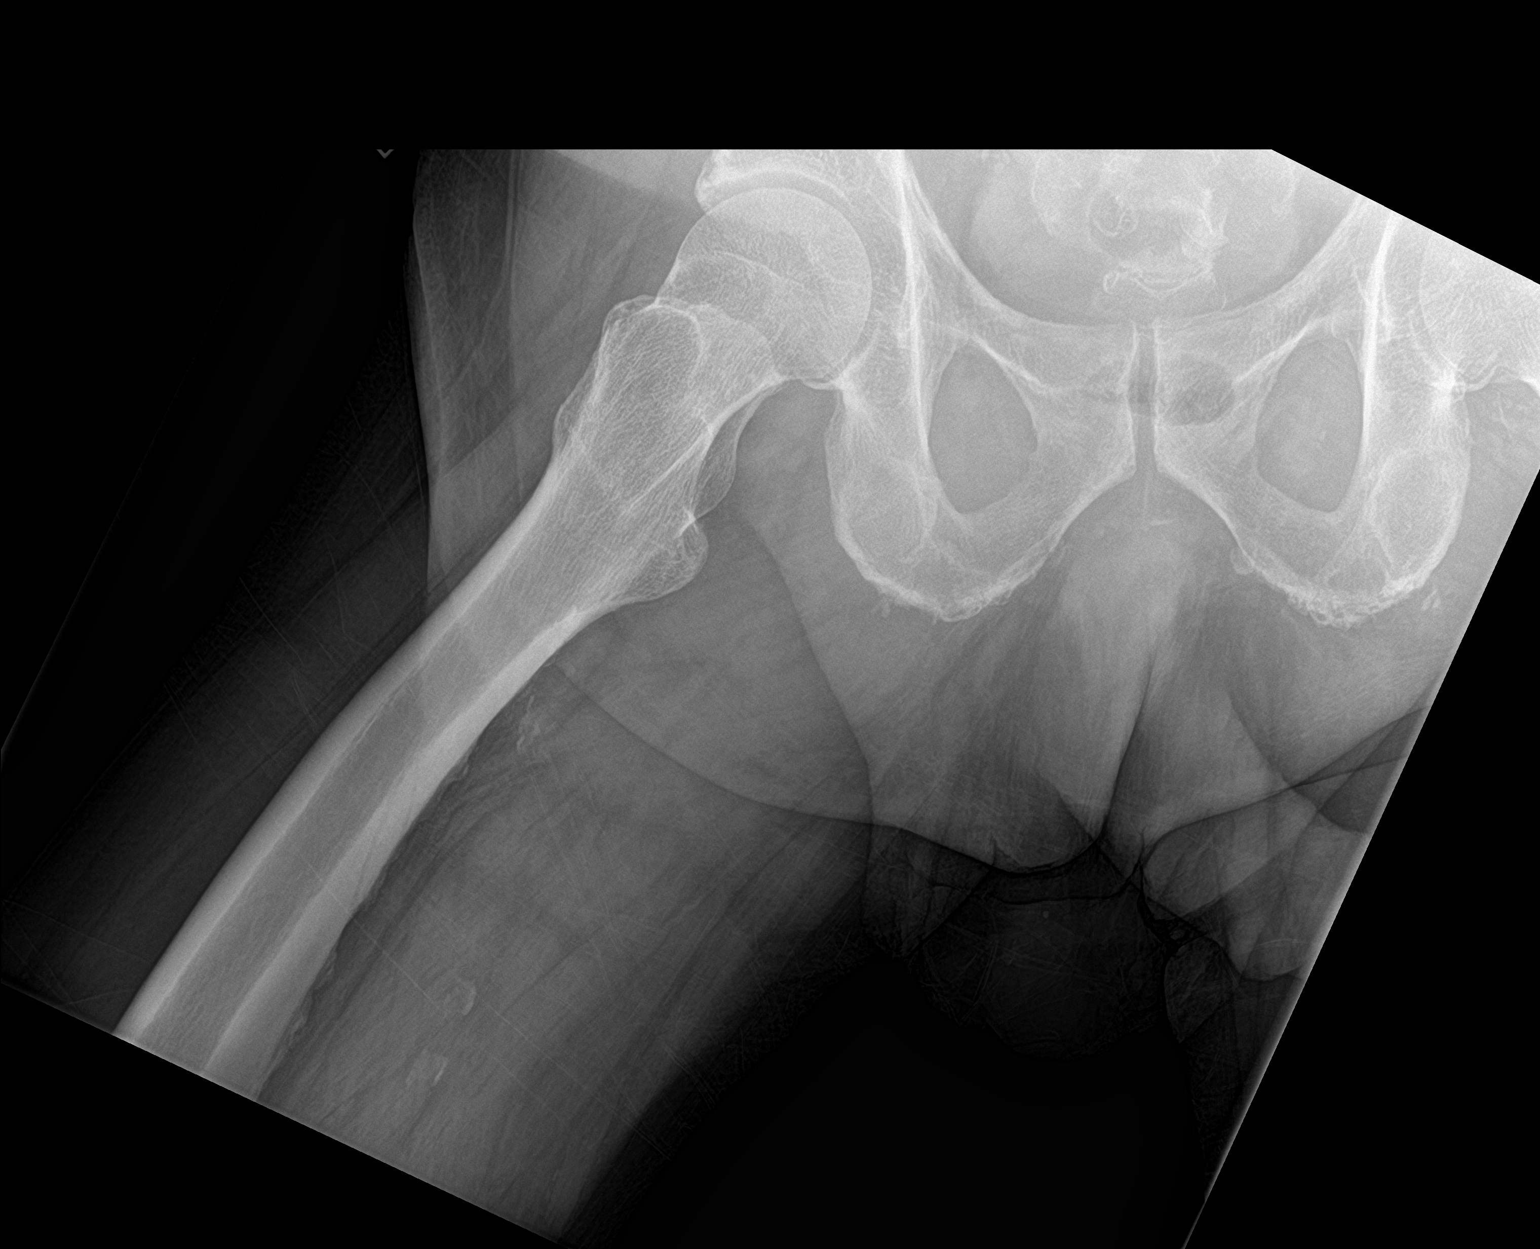

[3 of 3 positions shown; findings below may reference images not displayed]

FINDINGS: There is no evidence of hip fracture or dislocation. Degenerative
changes are seen in the form of joint space narrowing and acetabular
sclerosis. There is mild vascular calcification.
IMPRESSION: No acute osseous injury.

## 2023-02-03 ENCOUNTER — Encounter: Payer: Self-pay | Admitting: Emergency Medicine

## 2023-02-03 ENCOUNTER — Other Ambulatory Visit: Payer: Self-pay

## 2023-02-03 ENCOUNTER — Emergency Department
Admission: EM | Admit: 2023-02-03 | Discharge: 2023-02-03 | Disposition: A | Discharge status: Home / Self Care | Payer: MEDICAID | Attending: Emergency Medicine | Admitting: Emergency Medicine

## 2023-02-03 DIAGNOSIS — R739 Hyperglycemia, unspecified: Secondary | ICD-10-CM | POA: Diagnosis present

## 2023-02-03 DIAGNOSIS — I10 Essential (primary) hypertension: Secondary | ICD-10-CM | POA: Insufficient documentation

## 2023-02-03 LAB — CBC
HCT: 43.8 % (ref 39.0–52.0)
Hemoglobin: 14.5 g/dL (ref 13.0–17.0)
MCH: 28.5 pg (ref 26.0–34.0)
MCHC: 33.1 g/dL (ref 30.0–36.0)
MCV: 86.1 fL (ref 80.0–100.0)
Platelets: 226 10*3/uL (ref 150–400)
RBC: 5.09 MIL/uL (ref 4.22–5.81)
RDW: 12.6 % (ref 11.5–15.5)
WBC: 5.8 10*3/uL (ref 4.0–10.5)
nRBC: 0 % (ref 0.0–0.2)

## 2023-02-03 LAB — URINALYSIS, ROUTINE W REFLEX MICROSCOPIC
Bacteria, UA: NONE SEEN
Bilirubin Urine: NEGATIVE
Glucose, UA: >=500 mg/dL — AB
Hgb urine dipstick: NEGATIVE
Ketones, ur: NEGATIVE mg/dL
Leukocytes,Ua: NEGATIVE
Nitrite: NEGATIVE
Protein, ur: 30 mg/dL — AB
Specific Gravity, Urine: 1.022 (ref 1.005–1.030)
WBC, UA: 0 WBC/hpf (ref 0–5)
pH: 6 (ref 5.0–8.0)

## 2023-02-03 LAB — COMPREHENSIVE METABOLIC PANEL
ALT: 14 U/L (ref 0–44)
AST: 18 U/L (ref 15–41)
Albumin: 3.1 g/dL — ABNORMAL LOW (ref 3.5–5.0)
Alkaline Phosphatase: 78 U/L (ref 38–126)
Anion gap: 10 (ref 5–15)
BUN: 16 mg/dL (ref 8–23)
CO2: 26 mmol/L (ref 22–32)
Calcium: 8.5 mg/dL — ABNORMAL LOW (ref 8.9–10.3)
Chloride: 98 mmol/L (ref 98–111)
Creatinine, Ser: 1.07 mg/dL (ref 0.61–1.24)
GFR, Estimated: >60 mL/min (ref >60)
Glucose, Bld: 424 mg/dL — ABNORMAL HIGH (ref 70–99)
Potassium: 4.2 mmol/L (ref 3.5–5.1)
Sodium: 134 mmol/L — ABNORMAL LOW (ref 135–145)
Total Bilirubin: 0.5 mg/dL (ref 0.3–1.2)
Total Protein: 7 g/dL (ref 6.5–8.1)

## 2023-02-03 LAB — CBG MONITORING, ED: Glucose-Capillary: 330 mg/dL — ABNORMAL HIGH (ref 70–99)

## 2023-02-03 MED ORDER — GABAPENTIN 100 MG PO CAPS
100.0000 mg | ORAL_CAPSULE | Freq: Every day | ORAL | 0 refills | Status: DC
  Filled 2023-02-03: qty 30, 30d supply, fill #0

## 2023-02-03 MED ORDER — ALLOPURINOL 100 MG PO TABS
100.0000 mg | ORAL_TABLET | Freq: Every day | ORAL | 0 refills | Status: DC
  Filled 2023-02-03: qty 30, 30d supply, fill #0

## 2023-02-03 MED ORDER — YUPELRI 175 MCG/3ML IN SOLN
175.0000 ug | Freq: Every day | RESPIRATORY_TRACT | 0 refills | Status: AC
  Filled 2023-02-03: qty 90, 30d supply, fill #0

## 2023-02-03 MED ORDER — ASPIRIN 81 MG PO TBEC
81.0000 mg | DELAYED_RELEASE_TABLET | Freq: Every day | ORAL | 0 refills | Status: AC
  Filled 2023-02-03: qty 30, 30d supply, fill #0

## 2023-02-03 MED ORDER — SODIUM CHLORIDE 0.9 % IV BOLUS
1000.0000 mL | Freq: Once | INTRAVENOUS | Status: AC
  Administered 2023-02-03: 1000 mL via INTRAVENOUS

## 2023-02-03 MED ORDER — INSULIN ASPART 100 UNIT/ML FLEXPEN
6.0000 [IU] | PEN_INJECTOR | Freq: Three times a day (TID) | SUBCUTANEOUS | 11 refills | Status: AC
  Filled 2023-02-03: qty 15, 83d supply, fill #0

## 2023-02-03 MED ORDER — LISINOPRIL 20 MG PO TABS
20.0000 mg | ORAL_TABLET | Freq: Every day | ORAL | 2 refills | Status: DC
  Filled 2023-02-03: qty 30, 30d supply, fill #0

## 2023-02-03 MED ORDER — SITAGLIPTIN PHOSPHATE 100 MG PO TABS
100.0000 mg | ORAL_TABLET | Freq: Every day | ORAL | 0 refills | Status: DC
  Filled 2023-02-03: qty 30, 30d supply, fill #0

## 2023-02-03 MED ORDER — ATORVASTATIN CALCIUM 40 MG PO TABS
40.0000 mg | ORAL_TABLET | Freq: Every day | ORAL | 0 refills | Status: DC
  Filled 2023-02-03: qty 30, 30d supply, fill #0

## 2023-02-03 MED ORDER — INSULIN ASPART 100 UNIT/ML IJ SOLN
8.0000 [IU] | Freq: Once | INTRAMUSCULAR | Status: AC
  Administered 2023-02-03: 8 [IU] via SUBCUTANEOUS
  Filled 2023-02-03: qty 1

## 2023-02-03 MED ORDER — FERROUS SULFATE 325 (65 FE) MG PO TBEC
325.0000 mg | DELAYED_RELEASE_TABLET | Freq: Every day | ORAL | 0 refills | Status: DC
  Filled 2023-02-03: qty 100, 100d supply, fill #0

## 2023-02-03 MED ORDER — TRAZODONE HCL 150 MG PO TABS
150.0000 mg | ORAL_TABLET | Freq: Every day | ORAL | 0 refills | Status: DC
  Filled 2023-02-03: qty 30, 30d supply, fill #0

## 2023-02-03 MED ORDER — INSULIN DETEMIR 100 UNIT/ML FLEXPEN
60.0000 [IU] | PEN_INJECTOR | Freq: Every day | SUBCUTANEOUS | 0 refills | Status: DC
  Filled 2023-02-03: qty 15, 25d supply, fill #0

## 2023-02-03 MED ORDER — METFORMIN HCL 500 MG PO TABS
1000.0000 mg | ORAL_TABLET | Freq: Two times a day (BID) | ORAL | 0 refills | Status: DC
  Filled 2023-02-03: qty 120, 30d supply, fill #0

## 2023-02-03 MED ORDER — TAMSULOSIN HCL 0.4 MG PO CAPS
0.4000 mg | ORAL_CAPSULE | Freq: Every day | ORAL | 0 refills | Status: AC
  Filled 2023-02-03: qty 30, 30d supply, fill #0

## 2023-02-03 MED ORDER — DAPAGLIFLOZIN PROPANEDIOL 10 MG PO TABS
10.0000 mg | ORAL_TABLET | Freq: Every day | ORAL | 0 refills | Status: AC
  Filled 2023-02-03: qty 30, 30d supply, fill #0

## 2023-02-03 MED ORDER — AMLODIPINE BESYLATE 10 MG PO TABS
10.0000 mg | ORAL_TABLET | Freq: Every day | ORAL | 2 refills | Status: DC
  Filled 2023-02-03: qty 30, 30d supply, fill #0

## 2023-02-03 MED ORDER — LORATADINE 10 MG PO TABS
10.0000 mg | ORAL_TABLET | Freq: Every day | ORAL | 0 refills | Status: DC
  Filled 2023-02-03: qty 30, 30d supply, fill #0

## 2023-02-03 MED ORDER — FINASTERIDE 5 MG PO TABS
5.0000 mg | ORAL_TABLET | Freq: Every day | ORAL | 0 refills | Status: AC
  Filled 2023-02-03: qty 30, 30d supply, fill #0

## 2023-02-03 NOTE — Discharge Instructions (Addendum)
Follow-up with your primary care physician.  Check your glucose frequently and use your sliding scale as directed.  Continue to follow-up with your caseworker for further needs for transportation and medication assistance.

## 2023-02-03 NOTE — ED Provider Notes (Signed)
[facility] Provider Note    Event Date/Time   First MD Initiated Contact with Patient 02/03/23 1654     (approximate)   History   Hyperglycemia   HPI  Juan Harper is a 62 y.o. male past medical history significant for hypertension, hyperglycemia who presents to the emergency department with not feeling well.  States he has not been feeling well for the past couple of days.  Has not been taking any of his home medications due to difficulty getting transport to primary care provider.  States he has an appointment in 1 month but he has not been able to take any of his medications or get it refilled.  States to call his caseworker.  Denies any chest pain, shortness of breath, nausea or vomiting.  Denies any abdominal pain.     Physical Exam   Triage Vital Signs: ED Triage Vitals  Encounter Vitals Group     BP 02/03/23 1627 (!) 170/104     Systolic BP Percentile --      Diastolic BP Percentile --      Pulse Rate 02/03/23 1627 89     Resp 02/03/23 1627 17     Temp 02/03/23 1627 98.3 F (36.8 C)     Temp Source 02/03/23 1627 Oral     SpO2 02/03/23 1627 98 %     Weight --      Height 02/03/23 1628 5\' 11"  (1.803 m)     Head Circumference --      Peak Flow --      Pain Score 02/03/23 1628 9     Pain Loc --      Pain Education --      Exclude from Growth Chart --     Most recent vital signs: Vitals:   02/03/23 1627 02/03/23 1952  BP: (!) 170/104 (!) 168/92  Pulse: 89 86  Resp: 17 18  Temp: 98.3 F (36.8 C) 98.4 F (36.9 C)  SpO2: 98% 98%    Physical Exam Constitutional:      Appearance: He is well-developed.  HENT:     Head: Atraumatic.  Eyes:     Conjunctiva/sclera: Conjunctivae normal.  Cardiovascular:     Rate and Rhythm: Regular rhythm.  Pulmonary:     Effort: No respiratory distress.  Musculoskeletal:     Cervical back: Normal range of motion.  Skin:    General: Skin is warm.  Neurological:     Mental Status: He is alert.  Mental status is at baseline.     IMPRESSION / MDM / ASSESSMENT AND PLAN / ED COURSE  I reviewed the triage vital signs and the nursing notes.  Differential diagnosis including hyperglycemia, DKA, dehydration  LABS (all labs ordered are listed, but only abnormal results are displayed) Labs interpreted as -    Labs Reviewed  URINALYSIS, ROUTINE W REFLEX MICROSCOPIC - Abnormal; Notable for the following components:      Result Value   Color, Urine STRAW (*)    APPearance CLEAR (*)    Glucose, UA >=500 (*)    Protein, ur 30 (*)    All other components within normal limits  COMPREHENSIVE METABOLIC PANEL - Abnormal; Notable for the following components:   Sodium 134 (*)    Glucose, Bld 424 (*)    Calcium 8.5 (*)    Albumin 3.1 (*)    All other components within normal limits  CBG MONITORING, ED - Abnormal; Notable for the following components:  Glucose-Capillary 330 (*)    All other components within normal limits  CBC  CBG MONITORING, ED     MDM  Hyperglycemia but does not meet criteria for DKA.  Given IV fluids and on reevaluation glucose improved to 330.  Given subcu insulin.  Called and discussed with pharmacy tech and caseworker.  Will give her prescription to our pharmacy for refills.  Discussed need to follow-up closely with primary care physician.     PROCEDURES:  Critical Care performed: No  Procedures  Patient's presentation is most consistent with acute presentation with potential threat to life or bodily function.   MEDICATIONS ORDERED IN ED: Medications  sodium chloride 0.9 % bolus 1,000 mL (0 mLs Intravenous Stopped 02/03/23 1821)  insulin aspart (novoLOG) injection 8 Units (8 Units Subcutaneous Given 02/03/23 1850)    FINAL CLINICAL IMPRESSION(S) / ED DIAGNOSES   Final diagnoses:  Hyperglycemia  Uncontrolled hypertension     Rx / DC Orders   ED Discharge Orders          Ordered    traZODone (DESYREL) 150 MG tablet  Daily at bedtime         02/03/23 1920    lisinopril (ZESTRIL) 20 MG tablet  Daily        02/03/23 1920    insulin aspart (NOVOLOG) 100 UNIT/ML FlexPen        02/03/23 1920    tamsulosin (FLOMAX) 0.4 MG CAPS capsule  Daily        02/03/23 1920    loratadine (CLARITIN) 10 MG tablet  Daily        02/03/23 1920    metFORMIN (GLUCOPHAGE) 500 MG tablet  2 times daily with meals        02/03/23 1920    revefenacin (YUPELRI) 175 MCG/3ML nebulizer solution  Daily        02/03/23 1920    amLODipine (NORVASC) 10 MG tablet  Daily        02/03/23 1920    insulin detemir (LEVEMIR) 100 UNIT/ML FlexPen  Daily at bedtime        02/03/23 1920    sitaGLIPtin (JANUVIA) 100 MG tablet  Daily        02/03/23 1920    gabapentin (NEURONTIN) 100 MG capsule  Daily        02/03/23 1920    finasteride (PROSCAR) 5 MG tablet  Daily        02/03/23 1920    ferrous sulfate 325 (65 FE) MG EC tablet  Daily with breakfast        02/03/23 1920    dapagliflozin propanediol (FARXIGA) 10 MG TABS tablet  Daily before breakfast        02/03/23 1920    atorvastatin (LIPITOR) 40 MG tablet  Daily at bedtime        02/03/23 1920    aspirin EC 81 MG tablet  Daily        02/03/23 1920    allopurinol (ZYLOPRIM) 100 MG tablet  Daily        02/03/23 1920             Note:  This document was prepared using Dragon voice recognition software and may include unintentional dictation errors.   Corena Herter, MD 02/03/23 [phone]

## 2023-02-03 NOTE — ED Triage Notes (Signed)
Pt arrives via [facility] for hyperglycemia. Pt has been out of his medications x4 months. Pt takes metformin, novolog, and januvia for diabetes. Pt stays at home alone. CBG 461 per EMS. His case manager Juluis Mire was on scene and would like a follow up call to help coordinate care. Her number is [phone]. Pt c/o polydipsia and polyuria.

## 2023-02-04 ENCOUNTER — Other Ambulatory Visit: Payer: Self-pay

## 2023-02-17 ENCOUNTER — Other Ambulatory Visit: Payer: Self-pay

## 2024-05-11 ENCOUNTER — Observation Stay

## 2024-05-11 ENCOUNTER — Inpatient Hospital Stay
Admission: EM | Admit: 2024-05-11 | Discharge: 2024-05-23 | Disposition: A | Discharge status: Skilled Nursing Facility | Attending: Internal Medicine | Admitting: Internal Medicine

## 2024-05-11 ENCOUNTER — Emergency Department

## 2024-05-11 DIAGNOSIS — Z7982 Long term (current) use of aspirin: Secondary | ICD-10-CM

## 2024-05-11 DIAGNOSIS — I110 Hypertensive heart disease with heart failure: Secondary | ICD-10-CM | POA: Diagnosis present

## 2024-05-11 DIAGNOSIS — I639 Cerebral infarction, unspecified: Principal | ICD-10-CM | POA: Diagnosis present

## 2024-05-11 DIAGNOSIS — G8191 Hemiplegia, unspecified affecting right dominant side: Secondary | ICD-10-CM | POA: Diagnosis present

## 2024-05-11 DIAGNOSIS — R29710 NIHSS score 10: Secondary | ICD-10-CM | POA: Diagnosis present

## 2024-05-11 DIAGNOSIS — E1165 Type 2 diabetes mellitus with hyperglycemia: Secondary | ICD-10-CM | POA: Diagnosis present

## 2024-05-11 DIAGNOSIS — Z981 Arthrodesis status: Secondary | ICD-10-CM

## 2024-05-11 DIAGNOSIS — N400 Enlarged prostate without lower urinary tract symptoms: Secondary | ICD-10-CM | POA: Diagnosis present

## 2024-05-11 DIAGNOSIS — I6523 Occlusion and stenosis of bilateral carotid arteries: Secondary | ICD-10-CM | POA: Diagnosis present

## 2024-05-11 DIAGNOSIS — Z794 Long term (current) use of insulin: Secondary | ICD-10-CM

## 2024-05-11 DIAGNOSIS — I6381 Other cerebral infarction due to occlusion or stenosis of small artery: Principal | ICD-10-CM | POA: Diagnosis present

## 2024-05-11 DIAGNOSIS — Z9842 Cataract extraction status, left eye: Secondary | ICD-10-CM

## 2024-05-11 DIAGNOSIS — E119 Type 2 diabetes mellitus without complications: Secondary | ICD-10-CM

## 2024-05-11 DIAGNOSIS — E785 Hyperlipidemia, unspecified: Secondary | ICD-10-CM | POA: Diagnosis present

## 2024-05-11 DIAGNOSIS — G4733 Obstructive sleep apnea (adult) (pediatric): Secondary | ICD-10-CM | POA: Diagnosis present

## 2024-05-11 DIAGNOSIS — Z9841 Cataract extraction status, right eye: Secondary | ICD-10-CM

## 2024-05-11 DIAGNOSIS — I10 Essential (primary) hypertension: Secondary | ICD-10-CM | POA: Diagnosis present

## 2024-05-11 DIAGNOSIS — Z91030 Bee allergy status: Secondary | ICD-10-CM

## 2024-05-11 DIAGNOSIS — E43 Unspecified severe protein-calorie malnutrition: Secondary | ICD-10-CM | POA: Diagnosis present

## 2024-05-11 DIAGNOSIS — R471 Dysarthria and anarthria: Secondary | ICD-10-CM | POA: Diagnosis present

## 2024-05-11 DIAGNOSIS — Z79899 Other long term (current) drug therapy: Secondary | ICD-10-CM

## 2024-05-11 DIAGNOSIS — R29810 Facial weakness: Secondary | ICD-10-CM | POA: Diagnosis present

## 2024-05-11 DIAGNOSIS — Z79890 Hormone replacement therapy (postmenopausal): Secondary | ICD-10-CM

## 2024-05-11 DIAGNOSIS — Z961 Presence of intraocular lens: Secondary | ICD-10-CM | POA: Diagnosis present

## 2024-05-11 DIAGNOSIS — R1310 Dysphagia, unspecified: Secondary | ICD-10-CM | POA: Diagnosis present

## 2024-05-11 DIAGNOSIS — R633 Feeding difficulties, unspecified: Secondary | ICD-10-CM | POA: Diagnosis present

## 2024-05-11 DIAGNOSIS — F17210 Nicotine dependence, cigarettes, uncomplicated: Secondary | ICD-10-CM | POA: Diagnosis present

## 2024-05-11 DIAGNOSIS — Z7902 Long term (current) use of antithrombotics/antiplatelets: Secondary | ICD-10-CM

## 2024-05-11 DIAGNOSIS — Z7951 Long term (current) use of inhaled steroids: Secondary | ICD-10-CM

## 2024-05-11 DIAGNOSIS — I5032 Chronic diastolic (congestive) heart failure: Secondary | ICD-10-CM | POA: Diagnosis present

## 2024-05-11 DIAGNOSIS — R296 Repeated falls: Secondary | ICD-10-CM | POA: Diagnosis present

## 2024-05-11 DIAGNOSIS — F1990 Other psychoactive substance use, unspecified, uncomplicated: Secondary | ICD-10-CM | POA: Diagnosis present

## 2024-05-11 DIAGNOSIS — Z8249 Family history of ischemic heart disease and other diseases of the circulatory system: Secondary | ICD-10-CM

## 2024-05-11 DIAGNOSIS — Z6826 Body mass index (BMI) 26.0-26.9, adult: Secondary | ICD-10-CM

## 2024-05-11 DIAGNOSIS — Z7984 Long term (current) use of oral hypoglycemic drugs: Secondary | ICD-10-CM

## 2024-05-11 DIAGNOSIS — F1490 Cocaine use, unspecified, uncomplicated: Secondary | ICD-10-CM | POA: Diagnosis present

## 2024-05-11 LAB — COMPREHENSIVE METABOLIC PANEL WITH GFR
ALT: 20 U/L (ref 0–44)
AST: 31 U/L (ref 15–41)
Albumin: 3.8 g/dL (ref 3.5–5.0)
Alkaline Phosphatase: 100 U/L (ref 38–126)
Anion gap: 15 (ref 5–15)
BUN: 22 mg/dL (ref 8–23)
CO2: 23 mmol/L (ref 22–32)
Calcium: 9.4 mg/dL (ref 8.9–10.3)
Chloride: 102 mmol/L (ref 98–111)
Creatinine, Ser: 1.09 mg/dL (ref 0.61–1.24)
GFR, Estimated: >60 mL/min (ref >60)
Glucose, Bld: 156 mg/dL — ABNORMAL HIGH (ref 70–99)
Potassium: 4.4 mmol/L (ref 3.5–5.1)
Sodium: 139 mmol/L (ref 135–145)
Total Bilirubin: 0.6 mg/dL (ref 0.0–1.2)
Total Protein: 7.6 g/dL (ref 6.5–8.1)

## 2024-05-11 LAB — LIPID PANEL
Cholesterol: 212 mg/dL — ABNORMAL HIGH (ref 0–200)
HDL: 41 mg/dL (ref >40)
LDL Cholesterol: 154 mg/dL — ABNORMAL HIGH (ref 0–99)
Total CHOL/HDL Ratio: 5.2 ratio
Triglycerides: 87 mg/dL (ref <150)
VLDL: 17 mg/dL (ref 0–40)

## 2024-05-11 LAB — DIFFERENTIAL
Abs Immature Granulocytes: 0.03 K/uL (ref 0.00–0.07)
Basophils Absolute: 0.1 K/uL (ref 0.0–0.1)
Basophils Relative: 1 %
Eosinophils Absolute: 0.1 K/uL (ref 0.0–0.5)
Eosinophils Relative: 1 %
Immature Granulocytes: 0 %
Lymphocytes Relative: 16 %
Lymphs Abs: 1.1 K/uL (ref 0.7–4.0)
Monocytes Absolute: 0.9 K/uL (ref 0.1–1.0)
Monocytes Relative: 13 %
Neutro Abs: 4.7 K/uL (ref 1.7–7.7)
Neutrophils Relative %: 69 %

## 2024-05-11 LAB — CBC
HCT: 42.4 % (ref 39.0–52.0)
Hemoglobin: 14.2 g/dL (ref 13.0–17.0)
MCH: 28.7 pg (ref 26.0–34.0)
MCHC: 33.5 g/dL (ref 30.0–36.0)
MCV: 85.7 fL (ref 80.0–100.0)
Platelets: 283 K/uL (ref 150–400)
RBC: 4.95 MIL/uL (ref 4.22–5.81)
RDW: 13.3 % (ref 11.5–15.5)
WBC: 6.9 K/uL (ref 4.0–10.5)
nRBC: 0 % (ref 0.0–0.2)

## 2024-05-11 LAB — PROTIME-INR
INR: 1 (ref 0.8–1.2)
Prothrombin Time: 13.6 s (ref 11.4–15.2)

## 2024-05-11 LAB — CBG MONITORING, ED: Glucose-Capillary: 151 mg/dL — ABNORMAL HIGH (ref 70–99)

## 2024-05-11 LAB — GLUCOSE, CAPILLARY
Glucose-Capillary: 131 mg/dL — ABNORMAL HIGH (ref 70–99)
Glucose-Capillary: 79 mg/dL (ref 70–99)

## 2024-05-11 LAB — MAGNESIUM: Magnesium: 1.8 mg/dL (ref 1.7–2.4)

## 2024-05-11 LAB — APTT: aPTT: 29 s (ref 24–36)

## 2024-05-11 LAB — ETHANOL: Alcohol, Ethyl (B): <15 mg/dL (ref <15)

## 2024-05-11 MED ORDER — STROKE: EARLY STAGES OF RECOVERY BOOK: Freq: Once | Status: AC

## 2024-05-11 MED ORDER — HEPARIN SODIUM (PORCINE) 5000 UNIT/ML IJ SOLN
5000.0000 [IU] | Freq: Three times a day (TID) | INTRAMUSCULAR | Status: DC
  Administered 2024-05-11 – 2024-05-21 (×29): 5000 [IU] via SUBCUTANEOUS
  Filled 2024-05-11 (×29): qty 1

## 2024-05-11 MED ORDER — SODIUM CHLORIDE 0.9% FLUSH
3.0000 mL | Freq: Two times a day (BID) | INTRAVENOUS | Status: DC
  Administered 2024-05-11 – 2024-05-23 (×21): 3 mL via INTRAVENOUS

## 2024-05-11 MED ORDER — INSULIN ASPART 100 UNIT/ML IJ SOLN
0.0000 [IU] | Freq: Three times a day (TID) | INTRAMUSCULAR | Status: DC
  Administered 2024-05-11 – 2024-05-12 (×2): 1 [IU] via SUBCUTANEOUS
  Administered 2024-05-13: 2 [IU] via SUBCUTANEOUS
  Administered 2024-05-13: 1 [IU] via SUBCUTANEOUS
  Administered 2024-05-14: 2 [IU] via SUBCUTANEOUS
  Administered 2024-05-15 – 2024-05-16 (×4): 1 [IU] via SUBCUTANEOUS
  Administered 2024-05-16 – 2024-05-17 (×5): 2 [IU] via SUBCUTANEOUS
  Filled 2024-05-11: qty 2
  Filled 2024-05-11 (×2): qty 1
  Filled 2024-05-11: qty 2
  Filled 2024-05-11: qty 1
  Filled 2024-05-11: qty 2
  Filled 2024-05-11 (×3): qty 1
  Filled 2024-05-11 (×4): qty 2

## 2024-05-11 MED ORDER — ONDANSETRON HCL 4 MG PO TABS: 4.0000 mg | ORAL_TABLET | Freq: Four times a day (QID) | ORAL | Status: DC | PRN

## 2024-05-11 MED ORDER — ROSUVASTATIN CALCIUM 20 MG PO TABS
20.0000 mg | ORAL_TABLET | Freq: Every day | ORAL | Status: DC
  Administered 2024-05-12 – 2024-05-13 (×2): 20 mg via ORAL
  Filled 2024-05-11 (×3): qty 1

## 2024-05-11 MED ORDER — ACETAMINOPHEN 650 MG RE SUPP: 650.0000 mg | Freq: Four times a day (QID) | RECTAL | Status: DC | PRN

## 2024-05-11 MED ORDER — ASPIRIN 300 MG RE SUPP
300.0000 mg | Freq: Once | RECTAL | Status: AC
  Administered 2024-05-11: 22:00:00 300 mg via RECTAL
  Filled 2024-05-11: qty 1

## 2024-05-11 MED ORDER — CLOPIDOGREL BISULFATE 75 MG PO TABS
75.0000 mg | ORAL_TABLET | Freq: Every day | ORAL | Status: DC
  Administered 2024-05-12 – 2024-05-13 (×2): 75 mg via ORAL
  Filled 2024-05-11 (×2): qty 1

## 2024-05-11 MED ORDER — ONDANSETRON HCL 4 MG/2ML IJ SOLN: 4.0000 mg | Freq: Four times a day (QID) | INTRAMUSCULAR | Status: DC | PRN

## 2024-05-11 MED ORDER — ACETAMINOPHEN 325 MG PO TABS
650.0000 mg | ORAL_TABLET | Freq: Four times a day (QID) | ORAL | Status: DC | PRN
  Administered 2024-05-15: 650 mg via ORAL
  Filled 2024-05-11: qty 2

## 2024-05-11 MED ORDER — INSULIN ASPART 100 UNIT/ML IJ SOLN: 0.0000 [IU] | Freq: Every day | INTRAMUSCULAR | Status: DC

## 2024-05-11 MED ORDER — SENNOSIDES-DOCUSATE SODIUM 8.6-50 MG PO TABS: 1.0000 | ORAL_TABLET | Freq: Every evening | ORAL | Status: DC | PRN

## 2024-05-11 NOTE — ED Triage Notes (Signed)
 Refer to first nurse note. Pt has slurred speech/incomprehensible speech. Per EMS pt has a baseline of slurred speech, but family states this has worsened over the last two days.

## 2024-05-11 NOTE — ED Triage Notes (Signed)
 First nurse note: pt to ED [facility] from home for multiple falls, increased weakness. Pt has slurred speech, has baseline slurred speech for family that has worsened over 2 days.,

## 2024-05-11 NOTE — H&P (Addendum)
 History and Physical    Juan Harper FMW:[id number] DOB: [birth year] DOA: 05/11/2024  DOS: the patient was seen and examined on 05/11/2024  PCP: Sharl Delon NOVAK, NP   Patient coming from: Home  I have personally briefly reviewed patient's old medical records in [facility] and CareEverywhere  HPI:   Juan Harper is a 63 y.o. year old male with medical history of HTN, HLD, DMII presenting to the ED after having worsening slurred speech.   Pt with significantly slurred speech. Not able to contribute significantly to hx given the slurred speech. Denies any acute complaints. Discussed with son who states she speaks with his father daily. He spoke with him yesterday and he wasn't able to understand him. He sent her niece to check on him but she reported that he was doing everything per routine. Per his report they are having significant issues with pt's roommate as they want him evicted as he is a negative influence on the patient.    On arrival to the ED patient was noted to be HDS stable. Lab work and imaging obtained. CBC unremarkable. CMP with mild hyperglycemia but otherwise unremarkable. INR wnl. Ethanol negative. CTH shows acute/subacute stroke in the left internal capsule with remote bialteral thalamic strokes. Given this and pt being outside of any acute interventions, [facility] contacted for admission.  Review of Systems: As mentioned in the history of present illness. All other systems reviewed and are negative.   Past Medical History:  Diagnosis Date   Arthritis    NECK/ RIGHT KNEE   Asthma    Bell's palsy    BPH (benign prostatic hyperplasia)    Cough    chronic   Diabetes mellitus without complication (HCC)    Edema    legs/feet   Fusion of spine of cervical region    PLANNED FOR APRIL   HOH (hard of hearing)    Hypertension    Orthopnea    Shortness of breath dyspnea    Sleep apnea    cpap   Wears dentures    partial upper and lower   Wheezing     Past  Surgical History:  Procedure Laterality Date   BACK SURGERY     CATARACT EXTRACTION W/PHACO Right 07/31/2016   Procedure: CATARACT EXTRACTION PHACO AND INTRAOCULAR LENS PLACEMENT (IOC);  Surgeon: Adine Oneil Novak, MD;  Location: [facility] ORS;  Service: Ophthalmology;  Laterality: Right;  Lot # [id number] H US :00:26.9 AP%:6.8% CDE: 1.83   CATARACT EXTRACTION W/PHACO Left 06/24/2021   Procedure: CATARACT EXTRACTION PHACO AND INTRAOCULAR LENS PLACEMENT (IOC) LEFT DIABETIC;  Surgeon: Novak Adine Oneil, MD;  Location: [facility];  Service: Ophthalmology;  Laterality: Left;  3.35 0:23.9   COLONOSCOPY     NECK MASS EXCISION     PILONIDAL CYST EXCISION       Allergies[1]  Family History  Problem Relation Age of Onset   Hypertension Mother     Prior to Admission medications  Medication Sig Start Date End Date Taking? Authorizing Provider  chlorthalidone (HYGROTON) 25 MG tablet Take 25 mg by mouth daily. 06/18/23  Yes [provider]  dapagliflozin  propanediol (FARXIGA ) 10 MG TABS tablet Take 10 mg by mouth daily. 06/18/23  Yes [provider]  lisinopril  (ZESTRIL ) 20 MG tablet Take 40 mg by mouth daily. 04/10/23  Yes [provider]  metFORMIN  (GLUCOPHAGE ) 500 MG tablet Take 1,000 mg by mouth 2 (two) times daily with a meal. 06/18/23  Yes [provider]  acetaminophen  (  TYLENOL ) 325 MG tablet Take 650 mg by mouth every 6 (six) hours as needed.    [provider]  albuterol  (PROVENTIL  HFA;VENTOLIN  HFA) 108 (90 Base) MCG/ACT inhaler Inhale 2 puffs into the lungs every 6 (six) hours as needed for wheezing or shortness of breath.    [provider]  budesonide-formoterol  (SYMBICORT) 80-4.5 MCG/ACT inhaler Inhale 2 puffs into the lungs 2 (two) times daily.    [provider]  diclofenac sodium (VOLTAREN) 1 % GEL Apply 2 g topically 2 (two) times daily. Patient not taking: Reported on 02/07/2021    [provider]  GVOKE HYPOPEN  2-PACK 1 MG/0.2ML [facility]  07/31/20   [provider]  insulin  aspart (NOVOLOG ) 100 UNIT/ML FlexPen Inject 0-6 Units into the skin 3 (three) times daily before meals using sliding scale. 02/03/23   Suzanne Kirsch, MD  ipratropium (ATROVENT  HFA) 17 MCG/ACT inhaler Inhale 2 puffs into the lungs every 6 (six) hours.    [provider]  Melatonin 5 MG CAPS Take 1 capsule by mouth daily.    [provider]  potassium chloride (KLOR-CON) 10 MEQ tablet Take 10 mEq by mouth daily. 07/02/22   [provider]  vitamin B-12 (CYANOCOBALAMIN) 1000 MCG tablet Take 1,000 mcg by mouth daily. 08/17/20   [provider]    Social History:  reports that he has been smoking cigarettes. He has a 24 pack-year smoking history. He has never used smokeless tobacco. He reports current drug use. Drugs: Cocaine and Marijuana. He reports that he does not drink alcohol. Lives with friend. Smokes 1 ppd, occasional alcohol use. Poly substance use per son. Independent in ADLs and iADLs.     Physical Exam: Vitals:   05/11/24 1309 05/11/24 1317 05/11/24 1935  BP: (!) 149/89  (!) 132/101  Pulse: 98  97  Resp: 15  20  Temp: 98.4 F (36.9 C)  98.5 F (36.9 C)  SpO2: 95%  95%  Height:  5' 11 (1.803 m)     Gen: NAD HENT: NCAT CV: normal heart sounds Lung: CTAB Abd: No TTP, normal bowel sounds MSK: No asymmetry, good bulk and tone Neuro: alert, CNII-VI intact, Significant slurred speech, mild facial asymmetry, good sensation and strength in all extremities. Good grip strength. Heel to shin normal bilaterally, finger to nose testing normal.    Labs on Admission: I have personally reviewed following labs and imaging studies  CBC: Recent Labs  Lab 05/11/24 1320  WBC 6.9  NEUTROABS 4.7  HGB 14.2  HCT 42.4  MCV 85.7  PLT 283   Basic Metabolic Panel: Recent Labs  Lab 05/11/24 1320  NA 139  K 4.4  CL 102  CO2 23  GLUCOSE 156*  BUN 22  CREATININE 1.09  CALCIUM  9.4  MG  1.8   GFR: CrCl cannot be calculated (Unknown ideal weight.). Liver Function Tests: Recent Labs  Lab 05/11/24 1320  AST 31  ALT 20  ALKPHOS 100  BILITOT 0.6  PROT 7.6  ALBUMIN 3.8   No results for input(s): LIPASE, AMYLASE in the last 168 hours. No results for input(s): AMMONIA in the last 168 hours. Coagulation Profile: Recent Labs  Lab 05/11/24 1320  INR 1.0   Cardiac Enzymes: No results for input(s): CKTOTAL, CKMB, CKMBINDEX, TROPONINI, TROPONINIHS in the last 168 hours. BNP (last 3 results) No results for input(s): BNP in the last 8760 hours. HbA1C: No results for input(s): HGBA1C in the last 72 hours. CBG: Recent Labs  Lab 05/11/24 1319  05/11/24 1824  GLUCAP 151* 131*   Lipid Profile: Recent Labs    05/11/24 1320  CHOL 212*  HDL 41  LDLCALC 154*  TRIG 87  CHOLHDL 5.2   Thyroid Function Tests: No results for input(s): TSH, T4TOTAL, FREET4, T3FREE, THYROIDAB in the last 72 hours. Anemia Panel: No results for input(s): VITAMINB12, FOLATE, FERRITIN, TIBC, IRON, RETICCTPCT in the last 72 hours. Urine analysis:    Component Value Date/Time   COLORURINE STRAW (A) 02/03/2023 1630   APPEARANCEUR CLEAR (A) 02/03/2023 1630   LABSPEC 1.022 02/03/2023 1630   PHURINE 6.0 02/03/2023 1630   GLUCOSEU >=500 (A) 02/03/2023 1630   HGBUR NEGATIVE 02/03/2023 1630   BILIRUBINUR NEGATIVE 02/03/2023 1630   KETONESUR NEGATIVE 02/03/2023 1630   PROTEINUR 30 (A) 02/03/2023 1630   NITRITE NEGATIVE 02/03/2023 1630   LEUKOCYTESUR NEGATIVE 02/03/2023 1630    Radiological Exams on Admission: I have personally reviewed images MR BRAIN WO CONTRAST Result Date: 05/11/2024 EXAM: MRI BRAIN WITHOUT CONTRAST 05/11/2024 04:15:00 PM TECHNIQUE: Multiplanar multisequence MRI of the head/brain was performed without the administration of intravenous contrast. COMPARISON: CT head without contrast 05/11/2024. CLINICAL HISTORY: Neuro deficit, acute,  stroke suspected. Slurred speech for 2 days. FINDINGS: LIMITATIONS/ARTIFACTS: The study is mildly degraded by patient motion. BRAIN AND VENTRICLES: An acute 10mm nonhemorrhagic infarct is confirmed in the lateral aspect of the thalamus and posterior limb of the left internal capsule. 2 punctate infarcts are present within the posterior limb of the right internal capsule. Moderate atrophy and confluent periventricular white matter changes are present bilaterally. These extend to the subcortical regions bilaterally. Remote lacunar infarcts are present in the inferior cerebellum bilaterally. Remote ischemic changes extend into the brainstem. No intracranial hemorrhage. No mass. No midline shift. No hydrocephalus. The sella is unremarkable. Normal flow voids. ORBITS: No acute abnormality. SINUSES AND MASTOIDS: No acute abnormality. BONES AND SOFT TISSUES: Normal marrow signal. No acute soft tissue abnormality. IMPRESSION: 1. Acute nonhemorrhagic 10mm infarct in the lateral aspect of the thalamus and posterior limb of the left internal capsule. 2. Two acute punctate infarcts in the posterior limb of the right internal capsule. 3. Moderate atrophy and confluent periventricular white matter changes extending to the subcortical regions bilaterally. This most likely reflects the sequelae of chronic microvascular ischemia. 4. Remote lacunar infarcts in the inferior cerebellum bilaterally and remote ischemic changes extending into the brainstem. Critical value findings were called to Dr. Fernand. at 4:45pm. Electronically signed by: Lonni Necessary MD 05/11/2024 04:49 PM EST RP Workstation: [id number]   CT Cervical Spine Wo Contrast Result Date: 05/11/2024 EXAM: CT CERVICAL SPINE WITHOUT CONTRAST 05/11/2024 02:41:37 PM TECHNIQUE: CT of the cervical spine was performed without the administration of intravenous contrast. Multiplanar reformatted images are provided for review. Automated exposure control, iterative  reconstruction, and/or weight based adjustment of the mA/kV was utilized to reduce the radiation dose to as low as reasonably achievable. COMPARISON: CT of the cervical spine 02/06/2021. CLINICAL HISTORY: Multiple falls. FINDINGS: BONES AND ALIGNMENT: C4 corpectomy and anterior fusion C3-C5 is stable. No acute fracture or traumatic malalignment. DEGENERATIVE CHANGES: Adjacent level disease is again noted at C5-C6. Ossification in the posterior soft tissues is stable. SOFT TISSUES: No prevertebral soft tissue swelling. IMPRESSION: 1. Stable C4 corpectomy and anterior fusion C3-5. 2. No acute findings. Electronically signed by: Lonni Necessary MD 05/11/2024 03:00 PM EST RP Workstation: [id number]   CT HEAD WO CONTRAST Result Date: 05/11/2024 EXAM: CT HEAD WITHOUT CONTRAST 05/11/2024 02:41:37 PM TECHNIQUE: CT of the head was performed  without the administration of intravenous contrast. Automated exposure control, iterative reconstruction, and/or weight based adjustment of the mA/kV was utilized to reduce the radiation dose to as low as reasonably achievable. COMPARISON: None available. CLINICAL HISTORY: Slurred speech x2 days. FINDINGS: BRAIN AND VENTRICLES: No acute hemorrhage. An acute/subacute 12 mm hypodense infarct is present in the posterior limb of the left internal capsule. Regression of moderate confluent periventricular and scattered subcortical T2 hyperintensities is noted bilaterally. Remote lacunar infarcts are present in the thalamus bilaterally. Remote lacunar infarcts are present in the inferior cerebellum. Remote lacunar infarcts are present in the anterior limb of the right internal capsule and the caudate head. No hydrocephalus. No extra-axial collection. No mass effect or midline shift. ORBITS: Bilateral lens replacements are noted. The globes and orbits are otherwise within normal limits. SINUSES: Mild mucosal thickening is present in the maxillary sinuses bilaterally. SOFT TISSUES AND  SKULL: No acute soft tissue abnormality. No skull fracture. IMPRESSION: 1. Acute/subacute 12 mm hypodense infarct in the posterior limb of the left internal capsule. 2. Remote lacunar infarcts in the thalami bilaterally, inferior cerebellum, anterior limb of the right internal capsule, and caudate head. 3. Moderate chronic microvascular ischemic white matter changes. Electronically signed by: Lonni Necessary MD 05/11/2024 02:57 PM EST RP Workstation: [id number]    EKG: My personal interpretation of EKG shows: EKG with sinus rhythm without any acute ST changes.    Assessment/Plan Principal Problem:   Cerebral infarction [facility]) Active Problems:   Diabetes (HCC)   HTN (hypertension)   BPH (benign prostatic hyperplasia)   Chronic diastolic CHF (congestive heart failure) (HCC)   Patient with symptoms of slurred speech with image showing acute to subacute stroke.  Patient is outside of window for any acute interventions. Neurology consulted, appreciate their recommendations.  - Allow for permissive HTN in the setting of acute stroke  - ASA 81 mg daily and Plavix  75 mg for 21 days followed by ASA alone - High Intensity Statin - Echocardiogram  - CTA head & neck  - A1C  - Lipid panel  - Tele monitoring  - SLP eval for swallowing and speech - PT/OT  Hypertension: Allow permissive hypertension.  Type 2 diabetes: Unsure of exact regimen patient is on.  He states he takes 3 medicines at home.  Will place patient on SSI and titrate as needed.  TOC: difficult home situation.   BPH: unsure if he is taking finasteride . Will await med rec.   VTE prophylaxis:  SQ Heparin   Diet:NPO Code Status:  Full Code Telemetry:  Admission status: Observation, Telemetry bed Patient is from: Home Anticipated d/c is to: Home Anticipated d/c is in: 1-2 days   Family Communication: Updated at bedside, Discussed with Son over the phone.  Consults called: Neurology, Dr. Matthews   Severity of  Illness: The appropriate patient status for this patient is OBSERVATION. Observation status is judged to be reasonable and necessary in order to provide the required intensity of service to ensure the patient's safety. The patient's presenting symptoms, physical exam findings, and initial radiographic and laboratory data in the context of their medical condition is felt to place them at decreased risk for further clinical deterioration. Furthermore, it is anticipated that the patient will be medically stable for discharge from the hospital within 2 midnights of admission.    Morene Bathe, MD [place] DEL. [facility]      [1]  Allergies Allergen Reactions   Bee Venom Itching

## 2024-05-11 NOTE — Plan of Care (Signed)

## 2024-05-11 NOTE — ED Notes (Signed)
 Patient transported to MRI

## 2024-05-11 NOTE — ED Notes (Signed)
 Spoke to son on the phone. Son states that pt is a drug user. States that patient sounded this slurred yesterday.

## 2024-05-11 NOTE — ED Notes (Signed)
 Called CCMD to place pt on cardiac monitor

## 2024-05-11 NOTE — ED Provider Notes (Signed)
 [facility] Provider Note    Event Date/Time   First MD Initiated Contact with Patient 05/11/24 1330     (approximate)   History   Chief Complaint Weakness   HPI  Juan Harper is a 63 y.o. male with past medical history of hypertension, diabetes, diastolic CHF, and BPH who presents to the ED complaining of weakness.  Cousin at bedside reports that patient has had about 24 hours of increased weakness, confusion, and multiple falls.  Patient has difficulty communicating due to severe slurred speech, reportedly has some slurred speech at baseline but this is worse than usual.  He is not sure what caused him to fall, denies any injuries and does not take a blood thinner.  He also denies any fevers, cough, chest pain, shortness of breath, nausea, vomiting, diarrhea, or dysuria.  Cousin at bedside is not aware of any recent medication changes.     Physical Exam   Triage Vital Signs: ED Triage Vitals  Encounter Vitals Group     BP 05/11/24 1309 (!) 149/89     Girls Systolic BP Percentile --      Girls Diastolic BP Percentile --      Boys Systolic BP Percentile --      Boys Diastolic BP Percentile --      Pulse Rate 05/11/24 1309 98     Resp 05/11/24 1309 15     Temp 05/11/24 1309 98.4 F (36.9 C)     Temp src --      SpO2 05/11/24 1309 95 %     Weight --      Height 05/11/24 1317 5' 11 (1.803 m)     Head Circumference --      Peak Flow --      Pain Score 05/11/24 1317 0     Pain Loc --      Pain Education --      Exclude from Growth Chart --     Most recent vital signs: Vitals:   05/11/24 1309  BP: (!) 149/89  Pulse: 98  Resp: 15  Temp: 98.4 F (36.9 C)  SpO2: 95%    Constitutional: Alert and oriented to person and place, but not time or situation. Eyes: Conjunctivae are normal. Head: Atraumatic. Nose: No congestion/rhinnorhea. Mouth/Throat: Mucous membranes are moist.  Neck: No midline cervical spine tenderness to  palpation. Cardiovascular: Normal rate, regular rhythm. Grossly normal heart sounds.  2+ radial pulses bilaterally. Respiratory: Normal respiratory effort.  No retractions. Lungs CTAB.  No chest wall tenderness to palpation. Gastrointestinal: Soft and nontender. No distention. Musculoskeletal: No lower extremity tenderness nor edema.  No upper extremity bony tenderness. Neurologic: Severe dysarthria. No other gross focal neurologic deficits are appreciated.    ED Results / Procedures / Treatments   Labs (all labs ordered are listed, but only abnormal results are displayed) Labs Reviewed  COMPREHENSIVE METABOLIC PANEL WITH GFR - Abnormal; Notable for the following components:      Result Value   Glucose, Bld 156 (*)    All other components within normal limits  CBG MONITORING, ED - Abnormal; Notable for the following components:   Glucose-Capillary 151 (*)    All other components within normal limits  PROTIME-INR  APTT  CBC  DIFFERENTIAL  ETHANOL  URINALYSIS, ROUTINE W REFLEX MICROSCOPIC     EKG  ED ECG REPORT I, Carlin Palin, the attending physician, personally viewed and interpreted this ECG.   Date: 05/11/2024  EKG Time: 13:14  Rate: 100  Rhythm: normal sinus rhythm  Axis: Normal  Intervals:none  ST&T Change: None  RADIOLOGY CT head reviewed and interpreted by me with no hemorrhage or midline shift.  PROCEDURES:  Critical Care performed: No  Procedures   MEDICATIONS ORDERED IN ED: Medications - No data to display   IMPRESSION / MDM / ASSESSMENT AND PLAN / ED COURSE  I reviewed the triage vital signs and the nursing notes.                              63 y.o. male with past medical history of hypertension, diabetes, diastolic CHF, and BPH who presents to the ED complaining of generalized weakness, confusion, and multiple falls over the past 24 hours.  Patient's presentation is most consistent with acute presentation with potential threat to life or  bodily function.  Differential diagnosis includes, but is not limited to, intracranial injury, cervical spine injury, stroke, UTI, anemia, electrolyte abnormality, AKI, medication effect.  Patient nontoxic-appearing and in no acute distress, vital signs are unremarkable.  EKG shows no evidence of arrhythmia or ischemia.  Patient with no obvious signs of trauma on exam and has a nonfocal neurologic exam outside of severe dysarthria.  Labs without significant anemia, leukocytosis, electrolyte abnormality, or AKI.  LFTs are unremarkable and urinalysis is pending.  No recent medication changes that could explain his presentation.  CT head and cervical spine are pending at this time.  CT head concerning for stroke in the internal capsule, CT cervical spine is negative for acute finding.  Case discussed with hospitalist for admission for further workup of stroke.      FINAL CLINICAL IMPRESSION(S) / ED DIAGNOSES   Final diagnoses:  Cerebrovascular accident (CVA), unspecified mechanism (HCC)  Multiple falls  Dysarthria     Rx / DC Orders   ED Discharge Orders     None        Note:  This document was prepared using Dragon voice recognition software and may include unintentional dictation errors.   Willo Dunnings, MD 05/11/24 [phone]

## 2024-05-11 NOTE — ED Notes (Signed)
 NURSE VET RN INFORMED OF BED ASSIGNED

## 2024-05-12 ENCOUNTER — Observation Stay

## 2024-05-12 DIAGNOSIS — Z7982 Long term (current) use of aspirin: Secondary | ICD-10-CM | POA: Diagnosis not present

## 2024-05-12 DIAGNOSIS — Z6826 Body mass index (BMI) 26.0-26.9, adult: Secondary | ICD-10-CM | POA: Diagnosis not present

## 2024-05-12 DIAGNOSIS — R1310 Dysphagia, unspecified: Secondary | ICD-10-CM | POA: Diagnosis present

## 2024-05-12 DIAGNOSIS — I6381 Other cerebral infarction due to occlusion or stenosis of small artery: Secondary | ICD-10-CM | POA: Diagnosis present

## 2024-05-12 DIAGNOSIS — Z8249 Family history of ischemic heart disease and other diseases of the circulatory system: Secondary | ICD-10-CM | POA: Diagnosis not present

## 2024-05-12 DIAGNOSIS — E43 Unspecified severe protein-calorie malnutrition: Secondary | ICD-10-CM | POA: Diagnosis present

## 2024-05-12 DIAGNOSIS — R471 Dysarthria and anarthria: Secondary | ICD-10-CM | POA: Diagnosis present

## 2024-05-12 DIAGNOSIS — E1165 Type 2 diabetes mellitus with hyperglycemia: Secondary | ICD-10-CM

## 2024-05-12 DIAGNOSIS — Z79899 Other long term (current) drug therapy: Secondary | ICD-10-CM | POA: Diagnosis not present

## 2024-05-12 DIAGNOSIS — Z7902 Long term (current) use of antithrombotics/antiplatelets: Secondary | ICD-10-CM | POA: Diagnosis not present

## 2024-05-12 DIAGNOSIS — Z794 Long term (current) use of insulin: Secondary | ICD-10-CM | POA: Diagnosis not present

## 2024-05-12 DIAGNOSIS — I10 Essential (primary) hypertension: Secondary | ICD-10-CM

## 2024-05-12 DIAGNOSIS — I110 Hypertensive heart disease with heart failure: Secondary | ICD-10-CM | POA: Diagnosis present

## 2024-05-12 DIAGNOSIS — Z7984 Long term (current) use of oral hypoglycemic drugs: Secondary | ICD-10-CM | POA: Diagnosis not present

## 2024-05-12 DIAGNOSIS — I5032 Chronic diastolic (congestive) heart failure: Secondary | ICD-10-CM

## 2024-05-12 DIAGNOSIS — I6523 Occlusion and stenosis of bilateral carotid arteries: Secondary | ICD-10-CM | POA: Diagnosis present

## 2024-05-12 DIAGNOSIS — I639 Cerebral infarction, unspecified: Secondary | ICD-10-CM

## 2024-05-12 DIAGNOSIS — R29810 Facial weakness: Secondary | ICD-10-CM | POA: Diagnosis present

## 2024-05-12 DIAGNOSIS — E785 Hyperlipidemia, unspecified: Secondary | ICD-10-CM | POA: Diagnosis present

## 2024-05-12 DIAGNOSIS — R29710 NIHSS score 10: Secondary | ICD-10-CM | POA: Diagnosis present

## 2024-05-12 DIAGNOSIS — N400 Enlarged prostate without lower urinary tract symptoms: Secondary | ICD-10-CM

## 2024-05-12 DIAGNOSIS — R29705 NIHSS score 5: Secondary | ICD-10-CM | POA: Diagnosis not present

## 2024-05-12 DIAGNOSIS — F17210 Nicotine dependence, cigarettes, uncomplicated: Secondary | ICD-10-CM | POA: Diagnosis present

## 2024-05-12 DIAGNOSIS — Z7951 Long term (current) use of inhaled steroids: Secondary | ICD-10-CM | POA: Diagnosis not present

## 2024-05-12 DIAGNOSIS — R296 Repeated falls: Secondary | ICD-10-CM | POA: Diagnosis present

## 2024-05-12 DIAGNOSIS — G8191 Hemiplegia, unspecified affecting right dominant side: Secondary | ICD-10-CM | POA: Diagnosis present

## 2024-05-12 LAB — CBC
HCT: 39.5 % (ref 39.0–52.0)
Hemoglobin: 13 g/dL (ref 13.0–17.0)
MCH: 28.3 pg (ref 26.0–34.0)
MCHC: 32.9 g/dL (ref 30.0–36.0)
MCV: 86.1 fL (ref 80.0–100.0)
Platelets: 265 K/uL (ref 150–400)
RBC: 4.59 MIL/uL (ref 4.22–5.81)
RDW: 13.2 % (ref 11.5–15.5)
WBC: 5.4 K/uL (ref 4.0–10.5)
nRBC: 0 % (ref 0.0–0.2)

## 2024-05-12 LAB — BASIC METABOLIC PANEL WITH GFR
Anion gap: 14 (ref 5–15)
BUN: 21 mg/dL (ref 8–23)
CO2: 22 mmol/L (ref 22–32)
Calcium: 9.3 mg/dL (ref 8.9–10.3)
Chloride: 103 mmol/L (ref 98–111)
Creatinine, Ser: 0.99 mg/dL (ref 0.61–1.24)
GFR, Estimated: >60 mL/min (ref >60)
Glucose, Bld: 76 mg/dL (ref 70–99)
Potassium: 4 mmol/L (ref 3.5–5.1)
Sodium: 140 mmol/L (ref 135–145)

## 2024-05-12 LAB — GLUCOSE, CAPILLARY
Glucose-Capillary: 90 mg/dL (ref 70–99)
Glucose-Capillary: 81 mg/dL (ref 70–99)
Glucose-Capillary: 148 mg/dL — ABNORMAL HIGH (ref 70–99)
Glucose-Capillary: 63 mg/dL — ABNORMAL LOW (ref 70–99)
Glucose-Capillary: 97 mg/dL (ref 70–99)
Glucose-Capillary: 102 mg/dL — ABNORMAL HIGH (ref 70–99)

## 2024-05-12 LAB — HEMOGLOBIN A1C
Hgb A1c MFr Bld: 8.1 % — ABNORMAL HIGH (ref 4.8–5.6)
Mean Plasma Glucose: 185.77 mg/dL

## 2024-05-12 LAB — HIV ANTIBODY (ROUTINE TESTING W REFLEX): HIV Screen 4th Generation wRfx: NONREACTIVE

## 2024-05-12 MED ORDER — DEXTROSE-SODIUM CHLORIDE 5-0.9 % IV SOLN: INTRAVENOUS | Status: AC

## 2024-05-12 MED ORDER — IOHEXOL 350 MG/ML SOLN
75.0000 mL | Freq: Once | INTRAVENOUS | Status: AC | PRN
  Administered 2024-05-12: 03:00:00 75 mL via INTRAVENOUS

## 2024-05-12 MED ORDER — ASPIRIN 81 MG PO TBEC
81.0000 mg | DELAYED_RELEASE_TABLET | Freq: Every day | ORAL | Status: DC
  Administered 2024-05-12 – 2024-05-13 (×2): 81 mg via ORAL
  Filled 2024-05-12 (×2): qty 1

## 2024-05-12 NOTE — Evaluation (Signed)
 Speech Language Pathology Evaluation Patient Details Name: Juan Harper MRN: [id number] DOB: 08/03/1960 Today's Date: 05/12/2024 Time: 0920-0940 SLP Time Calculation (min) (ACUTE ONLY): 20 min  Problem List:  Patient Active Problem List   Diagnosis Date Noted   Cerebral infarction (HCC) 05/11/2024   Fall at home, initial encounter 02/07/2021   Syncope 02/07/2021   Right hip pain 02/07/2021   BPH (benign prostatic hyperplasia)    Chronic diastolic CHF (congestive heart failure) (HCC)    AKI (acute kidney injury) 02/06/2021   Acute kidney injury 01/06/2018   Hyperkalemia 01/06/2018   Acute renal failure (ARF) 02/26/2017   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   Sleep apnea 11/24/2016   Diabetes (HCC) 11/24/2016   HTN (hypertension) 11/24/2016   Dizziness 11/24/2016   Acute on chronic renal failure 09/19/2016   Cervical disc disease with myelopathy 09/07/2016   Simple chronic bronchitis (HCC) 07/16/2016   Tobacco use 07/16/2016   Closed fracture of multiple ribs of left side 02/07/2016   BPH with obstruction/lower urinary tract symptoms 05/23/2014   Elevated PSA 05/23/2014   Erectile dysfunction 05/23/2014   Chest pain 12/10/2010   Past Medical History:  Past Medical History:  Diagnosis Date   Arthritis    NECK/ RIGHT KNEE   Asthma    Bell's palsy    BPH (benign prostatic hyperplasia)    Cough    chronic   Diabetes mellitus without complication (HCC)    Edema    legs/feet   Fusion of spine of cervical region    PLANNED FOR APRIL   HOH (hard of hearing)    Hypertension    Orthopnea    Shortness of breath dyspnea    Sleep apnea    cpap   Wears dentures    partial upper and lower   Wheezing    Past Surgical History:  Past Surgical History:  Procedure Laterality Date   BACK SURGERY     CATARACT EXTRACTION W/PHACO Right 07/31/2016   Procedure: CATARACT EXTRACTION PHACO AND INTRAOCULAR LENS PLACEMENT (IOC);  Surgeon:  Adine Oneil Novak, MD;  Location: [facility] ORS;  Service: Ophthalmology;  Laterality: Right;  Lot # [id number] H US :00:26.9 AP%:6.8% CDE: 1.83   CATARACT EXTRACTION W/PHACO Left 06/24/2021   Procedure: CATARACT EXTRACTION PHACO AND INTRAOCULAR LENS PLACEMENT (IOC) LEFT DIABETIC;  Surgeon: Novak Adine Oneil, MD;  Location: [facility];  Service: Ophthalmology;  Laterality: Left;  3.35 0:23.9   COLONOSCOPY     NECK MASS EXCISION     PILONIDAL CYST EXCISION     HPI:  Per H&P, Juan Harper is a 63 y.o. year old male with medical history of HTN, HLD, DMII presenting to the ED after having worsening slurred speech. MRI: 1. Acute nonhemorrhagic 10mm infarct in the lateral aspect of the thalamus and  posterior limb of the left internal capsule.  2. Two acute punctate infarcts in the posterior limb of the right internal  capsule.  3. Moderate atrophy and confluent periventricular white matter changes  extending to the subcortical regions bilaterally. This most likely reflects the  sequelae of chronic microvascular ischemia.  4. Remote lacunar infarcts in the inferior cerebellum bilaterally and remote  ischemic changes extending into the brainstem.   Assessment / Plan / Recommendation Clinical Impression  Pt seen for cognitive linguistic/motor speech evaluation in the setting of CVA. Assessment consisting of pt interview and completion of dynamic assessment. Most notable is pt's significant dysarthria, hindering intelligibility. Motor speech notable for reduced  articulation precision and low volume. Verbal cues for retrial and increased volume aided production to some degree, though communication breakdowns noted t/o assessment. Pt utilizing gesture to support speech. Emerging awareness for difference is speech and frustration noted. Flat affect predominant during session, with orientation intact with independent use of orientation aid (phone). Pt denied change in vision, and visual processing intact for  use of orientation device. Verbal cues aided sustained attention/redirection. Language needs further assessment- with pt demonstrating delayed responses with simple commands and prompts. Family not present for assessment to identify variance from cognitive baseline. Therefore, recommend continued assessment to determine approximation to baseline and follow up recommendations. Pt in agreement with stated plan.      SLP Assessment  SLP Recommendation/Assessment: Patient needs continued Speech Language Pathology Services SLP Visit Diagnosis: Dysarthria and anarthria (R47.1)     Assistance Recommended at Discharge   (TBD)  Functional Status Assessment Patient has had a recent decline in their functional status and demonstrates the ability to make significant improvements in function in a reasonable and predictable amount of time.  Frequency and Duration min 2x/week  2 weeks      SLP Evaluation Cognition  Overall Cognitive Status: Difficult to assess Arousal/Alertness: Awake/alert Orientation Level: Oriented to person;Oriented to place;Oriented to time;Oriented to situation (with aid of external orientation device (phone)) Attention: Sustained Sustained Attention: Impaired Sustained Attention Impairment: Verbal basic Memory:  (chart review indicates pt report of memory decline as of 2017) Awareness:  (emerging) Problem Solving:  (not directly assessed) Safety/Judgment:  (needs further assessment- able to identify call bell)       Comprehension  Auditory Comprehension Overall Auditory Comprehension: Other (comment) (delayed responses- though intact for single step commands and simple questions) Visual Recognition/Discrimination Discrimination: Within Function Limits Reading Comprehension Reading Status: Not tested    Expression Expression Primary Mode of Expression: Verbal Verbal Expression Overall Verbal Expression: Appears within functional limits for tasks assessed Interfering  Components: Speech intelligibility Written Expression Written Expression: Not tested   Oral / Motor  Oral Motor/Sensory Function Overall Oral Motor/Sensory Function: Mild impairment Facial ROM: Reduced right Facial Symmetry: Abnormal symmetry right Facial Strength: Reduced right Lingual ROM: Within Functional Limits Lingual Symmetry: Within Functional Limits Lingual Strength: Within Functional Limits Velum: Within Functional Limits Mandible: Within Functional Limits Motor Speech Overall Motor Speech: Impaired (unsure of baseline (chart review reports worsening slurred speech)) Respiration: Within functional limits Phonation: Low vocal intensity;Wet (intermittent wet) Resonance:  (need sfurther assessment) Articulation: Impaired Level of Impairment: Word Intelligibility: Intelligibility reduced Word: 25-49% accurate Phrase: 25-49% accurate Sentence: 0-24% accurate Conversation: 0-24% accurate Motor Planning: Not tested Motor Speech Errors: Aware Effective Techniques: Increased vocal intensity           Meha Vidrine Clapp, MS, CCC-SLP Speech Language Pathologist Rehab Services; [facility] [phone] (ascom)   Cynara Tatham J Clapp 05/12/2024, 11:12 AM

## 2024-05-12 NOTE — Evaluation (Signed)
 Occupational Therapy Evaluation Patient Details Name: Juan Harper MRN: [id number] DOB: 1960/09/29 Today's Date: 05/12/2024   History of Present Illness   Pt is a 63 y.o. year old male presenting to the ED after having worsening slurred speech, weakness, confusion and falls. 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 PMH of HTN, HLD, DMII     Clinical Impressions Pt was seen for OT evaluation this date. Unsure of pt's PLOF d/t poor historian and significant dysarthria. Reports he lives alone and has no family support, has a RW that he does not use. Pt presents with deficits in strength, balance, activity tolerance, and safety awareness limiting their ability to perform ADL management at baseline level. He is able to follow one step commands with increased time. Pt currently requires Mod A for trunkal elevation to reach EOB via HHA from therapist. Maintains good static seated balance, fair dynamic. Stands with Mod A progressing to Min A with cues for hand placement and safety, performed SPT from bed<>recliner using RW with Min A for RW management and sequencing/technique. Increased time and Min A to take lateral steps towards HOB. Noted with generalized weakness throughout BUEs and 3+/5 grip strength bilaterally. Mild coordination deficits in RUE/hand, although able to wash his face utilizing dominant R hand and assist with UB dressing tasks with Min A. Pt would benefit from skilled OT services to address noted impairments and functional limitations to maximize safety and independence while minimizing future risk of falls, injury, and readmission. Do anticipate the need for follow up OT services upon acute hospital DC.       If plan is discharge home, recommend the following:   A little help with walking and/or transfers;A lot  of help with bathing/dressing/bathroom;Assist for transportation;Assistance with cooking/housework;Help with stairs or ramp for entrance;Direct supervision/assist for medications management     Functional Status Assessment   Patient has had a recent decline in their functional status and demonstrates the ability to make significant improvements in function in a reasonable and predictable amount of time.     Equipment Recommendations   Other (comment) (defer to next venue)     Recommendations for Other Services         Precautions/Restrictions   Precautions Precautions: Fall Recall of Precautions/Restrictions: Impaired Restrictions Weight Bearing Restrictions Per Provider Order: No     Mobility Bed Mobility Overal bed mobility: Needs Assistance Bed Mobility: Supine to Sit     Supine to sit: Min assist, Mod assist, HOB elevated, Used rails     General bed mobility comments: assist for trunkal elevation and cues to foward scoot with CGA    Transfers Overall transfer level: Needs assistance Equipment used: Rolling walker (2 wheels) Transfers: Sit to/from Stand, Bed to chair/wheelchair/BSC Sit to Stand: Min assist, Mod assist     Step pivot transfers: Min assist     General transfer comment: MOD A for initial stand for lift off assist and cues for hand placement, progressing to Min A from recliner, verb cues for sequencing during transfer and RW management during lateral steps to [facility]      Balance Overall balance assessment: Needs assistance Sitting-balance support: Feet supported Sitting balance-Leahy Scale: Fair     Standing balance support: Bilateral upper extremity supported, Reliant on assistive device for balance Standing balance-Leahy Scale: Poor Standing balance comment: high fall  risk; +1 external support and RW use with heavy UE reliance                           ADL either performed or assessed with clinical judgement   ADL Overall  ADL's : Needs assistance/impaired     Grooming: Wash/dry face;Set up;Sitting Grooming Details (indicate cue type and reason): using R dominant hand         Upper Body Dressing : Minimal assistance;Bed level Upper Body Dressing Details (indicate cue type and reason): pt assisted with doffing/donning gowns     Toilet Transfer: Minimal assistance;Cueing for sequencing;Cueing for safety;Rolling walker (2 wheels) Toilet Transfer Details (indicate cue type and reason): simulated from bed<>recliner using RW with cues for safety and RW management         Functional mobility during ADLs: Minimal assistance;Cueing for safety;Cueing for sequencing;Rolling walker (2 wheels) General ADL Comments: lift off assist with initial stand, but progressed to Min A from recliner, cues for safety/sequencing and RW management during SPT from bed<>recliner and lateral steps to [facility]     Vision   Additional Comments: appears intact, L eye drooping/weakness     Perception         Praxis         Pertinent Vitals/Pain Pain Assessment Pain Assessment: No/denies pain     Extremity/Trunk Assessment Upper Extremity Assessment Upper Extremity Assessment: Right hand dominant;Generalized weakness;RUE deficits/detail;LUE deficits/detail (BUE ROM limited to ~100 degrees shoulder flexion) RUE Deficits / Details: 4-/5 strength except 3+/5 grip strength; mild FMC/GMC deficits in RUE RUE Coordination: decreased fine motor;decreased gross motor LUE Deficits / Details: 4-/5 weakness, 3+/5 grip strength   Lower Extremity Assessment Lower Extremity Assessment: Generalized weakness       Communication Communication Communication: Impaired Factors Affecting Communication: Difficulty expressing self;Reduced clarity of speech   Cognition Arousal: Alert, Lethargic                 OT - Cognition Comments: states name and place                 Following commands: Impaired Following commands  impaired: Follows one step commands with increased time     Cueing  General Comments   Cueing Techniques: Verbal cues;Gestural cues;Tactile cues  soiled linens, provided clean and changed out gown   Exercises Other Exercises Other Exercises: Edu on role of OT in acute setting.   Shoulder Instructions      Home Living Family/patient expects to be discharged to:: Private residence Living Arrangements: Alone Available Help at Discharge:  (reports none, but family is listed in chart) Type of Home: House Home Access: Stairs to enter [employer] of Steps: 4 Entrance Stairs-Rails: None Home Layout: One level               Home Equipment: [employer] (2 wheels)          Prior Functioning/Environment Prior Level of Function : Independent/Modified Independent;Patient poor historian/Family not available             Mobility Comments: pt reports he has a walker at home but does not use it; states he lives alone and has no family to assist, however son and cousin listed in chart ADLs Comments: reports IND; slurred speech hard to understand, unsure of accuracy of PLOF    OT Problem List: Decreased strength;Decreased activity tolerance;Impaired balance (sitting and/or standing);Decreased safety awareness;Decreased cognition   OT Treatment/Interventions: Self-care/ADL training;Therapeutic exercise;Therapeutic activities;Patient/family  education;DME and/or AE instruction;Balance training      OT Goals(Current goals can be found in the care plan section)   Acute Rehab OT Goals Patient Stated Goal: eat OT Goal Formulation: With patient Time For Goal Achievement: 05/26/24 Potential to Achieve Goals: Fair ADL Goals Pt Will Perform Grooming: with set-up;sitting;standing Pt Will Perform Lower Body Dressing: with supervision;sitting/lateral leans;sit to/from stand Pt Will Transfer to Toilet: ambulating;with supervision Pt/caregiver will Perform Home Exercise  Program: Increased strength;Both right and left upper extremity;With Supervision;With theraband;With written HEP provided   OT Frequency:  Min 2X/week    Co-evaluation              AM-PAC OT 6 Clicks Daily Activity     Outcome Measure Help from another person eating meals?: A Little Help from another person taking care of personal grooming?: A Little Help from another person toileting, which includes using toliet, bedpan, or urinal?: A Lot Help from another person bathing (including washing, rinsing, drying)?: A Lot Help from another person to put on and taking off regular upper body clothing?: A Little Help from another person to put on and taking off regular lower body clothing?: A Lot 6 Click Score: 15   End of Session Equipment Utilized During Treatment: Gait belt;Rolling walker (2 wheels) Nurse Communication: Mobility status  Activity Tolerance: Patient tolerated treatment well Patient left: in bed;with call bell/phone within reach;with bed alarm set  OT Visit Diagnosis: Other abnormalities of gait and mobility (R26.89);Muscle weakness (generalized) (M62.81);Unsteadiness on feet (R26.81)                Time: 9172-9142 OT Time Calculation (min): 30 min Charges:  OT General Charges $OT Visit: 1 Visit OT Evaluation $OT Eval Moderate Complexity: 1 Mod OT Treatments $Therapeutic Activity: 8-22 mins Babara [place] Chrismon, OTR/L  05/12/2024, 9:44 AM  Janine Reller E Chrismon 05/12/2024, 9:39 AM

## 2024-05-12 NOTE — Progress Notes (Signed)
 PROGRESS NOTE  Juan Harper FMW:[id number] DOB: [birth year]   PCP: Sharl Delon NOVAK, NP  Patient is from: Home.  Has a roommate.  DOA: 05/11/2024 LOS: 0  Chief complaints Chief Complaint  Patient presents with   Weakness     Brief Narrative / Interim history: 63 year old M with PMH of DM-2, HTN, HLD and cocaine use brought to ED due to slurred speech and difficulty speaking.  Patient was symptomatic for over 24 hours before arrival to ED  In ED, stable vitals.  Labs without significant finding.  EtOH level negative.  CT head showed acute/subacute stroke in left internal capsule with remote bilateral thalamic strokes.  Neurology consulted.    MRI brain showed acute nonhemorrhagic 10 mm infarct in the lateral aspect of thalamus and posterior limb of left IC, two acute punctate infarct in posterior limb of right IC and remote bilateral cerebral and brainstem CVA.  CT angio head and neck showed moderate to severe bilateral ICA stenosis of the cavernous segments, moderate left A2 stenosis, severe multifocal left P2 and distal right P3 stenosis.,  Focal moderate to severe stenosis involving the proximal left P1 segment and focal severe stenosis involving the distal left V4 segment.   Subjective: Seen and examined earlier this morning.  No major events overnight or this morning.  No complaints but difficult to understand due to slurred speech.  Notes no to pain.   Assessment and plan: Acute lacunar infarction: Presents with slurred speech.  Has right facial droop and relative weakness in left leg compared to right.  Imaging including CT head, MRI brain and CT angio head and neck as above.  LDL 154.  A1c 8.1%. -Continue aspirin  and Plavix  -Continue Crestor  -SLP recommended NPO. -Follow-up PT/OT -Follow echocardiogram -Follow UDS.  RN to send urine sample -Follow further neuro recommendation  NIDDM-2 with hyperglycemia: A1c 8.1%. Recent Labs  Lab 05/11/24 1319 05/11/24 1824  05/11/24 2223 05/12/24 0318 05/12/24 0802  GLUCAP 151* 131* 79 90 81  - Continue SSI-sensitive - Hold home p.o. meds.  Essential hypertension: BP within acceptable range. - Hold home antihypertensive meds   History of polysubstance use: Prior UDS positive for cocaine marijuana -Check UDS.  Requested RN to send urine sample  BPH: Does not seem to be on meds.  Concern about his living condition: Family concerned about living situation.  Patient lives with roommate. - TOC consulted to explore  further.  Body mass index is 26.17 kg/m.          DVT prophylaxis:  heparin  injection 5,000 Units Start: 05/11/24 1600  Code Status: Full code Family Communication: None at bedside Level of care: Telemetry Status is: Observation The patient will require care spanning > 2 midnights and should be moved to inpatient because: Acute CVA with dysarthria left leg weakness   Final disposition: Acute CVA   55 minutes with more than 50% spent in reviewing records, counseling patient/family and coordinating care.  Consultants:  Neurology  Procedures: None  Microbiology summarized: None  Objective: Vitals:   05/12/24 0000 05/12/24 0400 05/12/24 0500 05/12/24 0801  BP: (!) 140/75 121/77  132/75  Pulse: 89 92  92  Resp:  12  16  Temp: 98.2 F (36.8 C) 97.8 F (36.6 C)  98.1 F (36.7 C)  TempSrc: Oral Axillary    SpO2: 100% 94%  97%  Weight:   85.1 kg   Height:        Examination:  GENERAL: No apparent distress.  Nontoxic. HEENT: MMM.  Vision and hearing grossly intact.  NECK: Supple.  No apparent JVD.  RESP:  No IWOB.  Fair aeration bilaterally. CVS:  RRR. Heart sounds normal.  ABD/GI/GU: BS+. Abd soft, NTND.  MSK/EXT:  Moves extremities. No apparent deformity. No edema.  SKIN: no apparent skin lesion or wound NEURO: Awake and alert.  Oriented failure..  Severe dysarthria.  Difficult to understand speech.  Right facial droop.  Motor 3+/5 in LLE.  Light sensation intact  in all dermatomes. Patellar reflex symmetric.  No pronator drift.  Finger to nose intact.  No nystagmus. PSYCH: Calm. Normal affect.   Sch Meds:  Scheduled Meds:  aspirin  EC  81 mg Oral Daily   clopidogrel   75 mg Oral Daily   heparin   5,000 Units Subcutaneous Q8H   insulin  aspart  0-5 Units Subcutaneous QHS   insulin  aspart  0-9 Units Subcutaneous TID WC   rosuvastatin   20 mg Oral Daily   sodium chloride  flush  3 mL Intravenous Q12H   Continuous Infusions: PRN Meds:.acetaminophen  **OR** acetaminophen , ondansetron  **OR** ondansetron  (ZOFRAN ) IV, senna-docusate  Antimicrobials: Anti-infectives (From admission, onward)    None        I have personally reviewed the following labs and images: CBC: Recent Labs  Lab 05/11/24 1320 05/12/24 0547  WBC 6.9 5.4  NEUTROABS 4.7  --   HGB 14.2 13.0  HCT 42.4 39.5  MCV 85.7 86.1  PLT 283 265   BMP &GFR Recent Labs  Lab 05/11/24 1320 05/12/24 0547  NA 139 140  K 4.4 4.0  CL 102 103  CO2 23 22  GLUCOSE 156* 76  BUN 22 21  CREATININE 1.09 0.99  CALCIUM  9.4 9.3  MG 1.8  --    Estimated Creatinine Clearance: 79.2 mL/min (by C-G formula based on SCr of 0.99 mg/dL). Liver & Pancreas: Recent Labs  Lab 05/11/24 1320  AST 31  ALT 20  ALKPHOS 100  BILITOT 0.6  PROT 7.6  ALBUMIN 3.8   No results for input(s): LIPASE, AMYLASE in the last 168 hours. No results for input(s): AMMONIA in the last 168 hours. Diabetic: Recent Labs    05/11/24 1320  HGBA1C 8.1*   Recent Labs  Lab 05/11/24 1319 05/11/24 1824 05/11/24 2223 05/12/24 0318 05/12/24 0802  GLUCAP 151* 131* 79 90 81   Cardiac Enzymes: No results for input(s): CKTOTAL, CKMB, CKMBINDEX, TROPONINI in the last 168 hours. No results for input(s): PROBNP in the last 8760 hours. Coagulation Profile: Recent Labs  Lab 05/11/24 1320  INR 1.0   Thyroid Function Tests: No results for input(s): TSH, T4TOTAL, FREET4, T3FREE, THYROIDAB in  the last 72 hours. Lipid Profile: Recent Labs    05/11/24 1320  CHOL 212*  HDL 41  LDLCALC 154*  TRIG 87  CHOLHDL 5.2   Anemia Panel: No results for input(s): VITAMINB12, FOLATE, FERRITIN, TIBC, IRON, RETICCTPCT in the last 72 hours. Urine analysis:    Component Value Date/Time   COLORURINE STRAW (A) 02/03/2023 1630   APPEARANCEUR CLEAR (A) 02/03/2023 1630   LABSPEC 1.022 02/03/2023 1630   PHURINE 6.0 02/03/2023 1630   GLUCOSEU >=500 (A) 02/03/2023 1630   HGBUR NEGATIVE 02/03/2023 1630   BILIRUBINUR NEGATIVE 02/03/2023 1630   KETONESUR NEGATIVE 02/03/2023 1630   PROTEINUR 30 (A) 02/03/2023 1630   NITRITE NEGATIVE 02/03/2023 1630   LEUKOCYTESUR NEGATIVE 02/03/2023 1630   Sepsis Labs: Invalid input(s): PROCALCITONIN, LACTICIDVEN  Microbiology: No results found for this or any previous visit (from the past 240 hours).  Radiology  Studies: CT ANGIO HEAD NECK W WO CM Result Date: 05/12/2024 CLINICAL DATA:  Follow-up examination for stroke. EXAM: CT ANGIOGRAPHY HEAD AND NECK WITH AND WITHOUT CONTRAST TECHNIQUE: Multidetector CT imaging of the head and neck was performed using the standard protocol during bolus administration of intravenous contrast. Multiplanar CT image reconstructions and MIPs were obtained to evaluate the vascular anatomy. Carotid stenosis measurements (when applicable) are obtained utilizing NASCET criteria, using the distal internal carotid diameter as the denominator. RADIATION DOSE REDUCTION: This exam was performed according to the departmental dose-optimization program which includes automated exposure control, adjustment of the mA and/or kV according to patient size and/or use of iterative reconstruction technique. CONTRAST:  75mL OMNIPAQUE  IOHEXOL  350 MG/ML SOLN COMPARISON:  Comparison made with CT and MRI from 05/11/2024. FINDINGS: CT HEAD FINDINGS Brain: Patchy and confluent hypodensity involving the supratentorial cerebral white matter,  consistent with chronic small vessel ischemic disease, moderate in nature. Few scatter remote lacunar infarcts noted about the deep gray nuclei, pons, and cerebellum. Previously identified small acute ischemic infarcts involving the bilateral basal ganglia noted, grossly stable from prior MRI. No acute intracranial hemorrhage. No other acute large vessel territory infarct. No mass lesion or midline shift. No hydrocephalus or extra-axial fluid collection. Vascular: No abnormal hyperdense vessel. Skull: Scalp soft tissues demonstrate no acute finding. Calvarium intact. Sinuses/Orbits: Globes and orbital soft tissues within normal limits. Mild mucosal thickening present about the ethmoidal air cells and maxillary sinuses. No significant mastoid effusion. Other: None. Review of the MIP images confirms the above findings CTA NECK FINDINGS Aortic arch: Visualized aortic arch within normal limits for caliber. Standard 3 vessel morphology. Aortic atherosclerosis. No significant stenosis about the origin the great vessels. Right carotid system: Eccentric atheromatous plaque within the right CCA without significant stenosis. Calcified plaque about the right carotid bulb without hemodynamically significant greater than 50% stenosis. No dissection. Left carotid system: Eccentric plaque within the left CCA without hemodynamically significant stenosis. Calcified plaque about the left carotid bulb without hemodynamically significant greater than 50% stenosis. No dissection. Vertebral arteries: Both vertebral arteries arise from subclavian arteries. Right V1 segment is duplicated with separate origins from the right subclavian artery. Focal moderate to severe stenosis noted involving the proximal left V1 segment (series 10, image 299). No dissection. Skeleton: No worrisome osseous lesions. Prior fusion at C3-C5 with C4 corpectomy. Patient is edentulous. Other neck: No other acute finding. Upper chest: Paraseptal emphysema.  No  other acute finding. Review of the MIP images confirms the above findings CTA HEAD FINDINGS Anterior circulation: Atheromatous plaque about the carotid siphons bilaterally. Resultant moderate to severe stenoses about the stenoses about the cavernous segments bilaterally. A1 segments patent bilaterally. Normal anterior communicating artery complex. Atheromatous change about the ACAs bilaterally with associated moderate left A2 stenosis (series 10, image 26). Atheromatous irregularity about the M1 segments bilaterally without high-grade stenosis. No proximal MCA branch occlusion. Distal MCA branches perfused and symmetric. Distal small vessel atheromatous irregularity noted. Posterior circulation: Right V4 segment patent without stenosis. Focal severe stenosis involving the distal left V4 segment just prior to the vertebrobasilar junction (series 10, image 143). Left PICA patent. Right PICA not seen. Mild atheromatous irregularity about the basilar without significant stenosis. Superior cerebral arteries patent bilaterally. Both PCAs primarily supplied via the basilar. Atheromatous change about the PCAs bilaterally with is resultant severe multifocal left P2 stenoses, with additional severe distal right P3 stenosis (series 13, image 22). Venous sinuses: Patent allowing for timing the contrast bolus. Anatomic variants: As above.  No aneurysm. Review of the MIP images confirms the above findings IMPRESSION: CT HEAD: 1. Stable appearance of small acute ischemic infarcts involving the bilateral basal ganglia. No associated hemorrhage or mass effect. 2. Underlying moderate chronic microvascular ischemic disease with a few scattered remote lacunar infarcts about the deep gray nuclei, pons, and cerebellum. CTA HEAD AND NECK: 1. Negative CTA for acute large vessel occlusion or other emergent finding. 2. Atheromatous change about the carotid siphons with resultant moderate to severe stenoses about the cavernous segments  bilaterally. 3. Moderate left A2 stenosis. 4. Severe multifocal left P2 and distal right P3 stenoses. 5. Focal moderate to severe stenosis involving the proximal left V1 segment. 6. Focal severe stenosis involving the distal left V4 segment just prior to the vertebrobasilar junction. Aortic Atherosclerosis (ICD10-I70.0) and Emphysema (ICD10-J43.9). Electronically Signed   By: Morene Hoard M.D.   On: 05/12/2024 05:34   MR BRAIN WO CONTRAST Result Date: 05/11/2024 EXAM: MRI BRAIN WITHOUT CONTRAST 05/11/2024 04:15:00 PM TECHNIQUE: Multiplanar multisequence MRI of the head/brain was performed without the administration of intravenous contrast. COMPARISON: CT head without contrast 05/11/2024. CLINICAL HISTORY: Neuro deficit, acute, stroke suspected. Slurred speech for 2 days. FINDINGS: LIMITATIONS/ARTIFACTS: The study is mildly degraded by patient motion. BRAIN AND VENTRICLES: An acute 10mm nonhemorrhagic infarct is confirmed in the lateral aspect of the thalamus and posterior limb of the left internal capsule. 2 punctate infarcts are present within the posterior limb of the right internal capsule. Moderate atrophy and confluent periventricular white matter changes are present bilaterally. These extend to the subcortical regions bilaterally. Remote lacunar infarcts are present in the inferior cerebellum bilaterally. Remote ischemic changes extend into the brainstem. No intracranial hemorrhage. No mass. No midline shift. No hydrocephalus. The sella is unremarkable. Normal flow voids. ORBITS: No acute abnormality. SINUSES AND MASTOIDS: No acute abnormality. BONES AND SOFT TISSUES: Normal marrow signal. No acute soft tissue abnormality. IMPRESSION: 1. Acute nonhemorrhagic 10mm infarct in the lateral aspect of the thalamus and posterior limb of the left internal capsule. 2. Two acute punctate infarcts in the posterior limb of the right internal capsule. 3. Moderate atrophy and confluent periventricular white  matter changes extending to the subcortical regions bilaterally. This most likely reflects the sequelae of chronic microvascular ischemia. 4. Remote lacunar infarcts in the inferior cerebellum bilaterally and remote ischemic changes extending into the brainstem. Critical value findings were called to Dr. Fernand. at 4:45pm. Electronically signed by: Lonni Necessary MD 05/11/2024 04:49 PM EST RP Workstation: [id number]   CT Cervical Spine Wo Contrast Result Date: 05/11/2024 EXAM: CT CERVICAL SPINE WITHOUT CONTRAST 05/11/2024 02:41:37 PM TECHNIQUE: CT of the cervical spine was performed without the administration of intravenous contrast. Multiplanar reformatted images are provided for review. Automated exposure control, iterative reconstruction, and/or weight based adjustment of the mA/kV was utilized to reduce the radiation dose to as low as reasonably achievable. COMPARISON: CT of the cervical spine 02/06/2021. CLINICAL HISTORY: Multiple falls. FINDINGS: BONES AND ALIGNMENT: C4 corpectomy and anterior fusion C3-C5 is stable. No acute fracture or traumatic malalignment. DEGENERATIVE CHANGES: Adjacent level disease is again noted at C5-C6. Ossification in the posterior soft tissues is stable. SOFT TISSUES: No prevertebral soft tissue swelling. IMPRESSION: 1. Stable C4 corpectomy and anterior fusion C3-5. 2. No acute findings. Electronically signed by: Lonni Necessary MD 05/11/2024 03:00 PM EST RP Workstation: [id number]   CT HEAD WO CONTRAST Result Date: 05/11/2024 EXAM: CT HEAD WITHOUT CONTRAST 05/11/2024 02:41:37 PM TECHNIQUE: CT of the head was performed without the administration of  intravenous contrast. Automated exposure control, iterative reconstruction, and/or weight based adjustment of the mA/kV was utilized to reduce the radiation dose to as low as reasonably achievable. COMPARISON: None available. CLINICAL HISTORY: Slurred speech x2 days. FINDINGS: BRAIN AND VENTRICLES: No acute hemorrhage. An  acute/subacute 12 mm hypodense infarct is present in the posterior limb of the left internal capsule. Regression of moderate confluent periventricular and scattered subcortical T2 hyperintensities is noted bilaterally. Remote lacunar infarcts are present in the thalamus bilaterally. Remote lacunar infarcts are present in the inferior cerebellum. Remote lacunar infarcts are present in the anterior limb of the right internal capsule and the caudate head. No hydrocephalus. No extra-axial collection. No mass effect or midline shift. ORBITS: Bilateral lens replacements are noted. The globes and orbits are otherwise within normal limits. SINUSES: Mild mucosal thickening is present in the maxillary sinuses bilaterally. SOFT TISSUES AND SKULL: No acute soft tissue abnormality. No skull fracture. IMPRESSION: 1. Acute/subacute 12 mm hypodense infarct in the posterior limb of the left internal capsule. 2. Remote lacunar infarcts in the thalami bilaterally, inferior cerebellum, anterior limb of the right internal capsule, and caudate head. 3. Moderate chronic microvascular ischemic white matter changes. Electronically signed by: Lonni Necessary MD 05/11/2024 02:57 PM EST RP Workstation: [id number]      Ujbz T. Rupert Azzara [facility]  If 7PM-7AM, please contact night-coverage [email or web address] 05/12/2024, 12:00 PM

## 2024-05-12 NOTE — Evaluation (Addendum)
 Physical Therapy Evaluation Patient Details Name: Juan Harper MRN: [id number] DOB: 1960/12/25 Today's Date: 05/12/2024  History of Present Illness  Pt is a 63 y.o. year old male presenting to the ED on 05/11/24 after having worsening slurred speech, weakness, confusion and falls. 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 PMH of HTN, HLD, DMII  Clinical Impression  Pt is a 63 y.o. male admitted for the above. Received in semi-supine with family at bedside to confirm hx and PLOF. Pt performed bed mobility with supervision with cuing to use bed rails for efficiency. Upon first STS attempt, pt exhibited retropulsion, requiring MIN to control lowering down sit on bed. On second attempt required CGA and able to remain upright. Pt ambulated 57ft to recliner with cuing for safe hand placement to perform stand to sit t/f. Left in recliner with all needs met and cousin in room. Pt demonstrates deficits in functional strength and mobility and will benefit from PT services to address these concerns during his hospital stay.      If plan is discharge home, recommend the following: A little help with walking and/or transfers;A little help with bathing/dressing/bathroom   Can travel by private vehicle   No    Equipment Recommendations  (TBD at next venue)  Recommendations for Other Services       Functional Status Assessment Patient has had a recent decline in their functional status and demonstrates the ability to make significant improvements in function in a reasonable and predictable amount of time.     Precautions / Restrictions Precautions Precautions: Fall Recall of Precautions/Restrictions: Impaired Restrictions Weight Bearing Restrictions Per Provider Order: No      Mobility  Bed Mobility Overal bed mobility: Needs  Assistance Bed Mobility: Supine to Sit     Supine to sit: Used rails, Supervision, HOB elevated          Transfers Overall transfer level: Needs assistance Equipment used: Rolling walker (2 wheels) Transfers: Sit to/from Stand Sit to Stand: Min assist, Contact guard assist           General transfer comment: MIN A for initial STS d/t posterior lean requiring pt to sit back down. Subsequent STS CGA    Ambulation/Gait Ambulation/Gait assistance: Contact guard assist Gait Distance (Feet): 3 Feet Assistive device: Rolling walker (2 wheels) Gait Pattern/deviations: Decreased stride length, Trunk flexed, Narrow base of support       General Gait Details: CGA, cuing for body mechanics to sit in recliner  Stairs            Wheelchair Mobility     Tilt Bed    Modified Rankin (Stroke Patients Only)       Balance Overall balance assessment: Needs assistance Sitting-balance support: Feet supported Sitting balance-Leahy Scale: Fair     Standing balance support: Bilateral upper extremity supported, Reliant on assistive device for balance, During functional activity Standing balance-Leahy Scale: Poor Standing balance comment: retropulsion upon first standing attempt with RW                             Pertinent Vitals/Pain Pain Assessment Pain Assessment: No/denies pain    Home Living Family/patient expects to be discharged to:: Private residence Living Arrangements: Alone Available Help at Discharge: Family Type of Home: House Home Access: Stairs  to enter Entrance Stairs-Rails: None Entrance Stairs-Number of Steps: 4   Home Layout: One level Home Equipment: Agricultural Consultant (2 wheels)      Prior Function Prior Level of Function : Independent/Modified Independent             Mobility Comments: RW at home ADLs Comments: Per OT note: reports IND; slurred speech hard to understand, unsure of accuracy of PLOF     Extremity/Trunk  Assessment   Upper Extremity Assessment Upper Extremity Assessment: Defer to OT evaluation RUE Deficits / Details: 4-/5 strength except 3+/5 grip strength; mild FMC/GMC deficits in RUE RUE Coordination: decreased fine motor;decreased gross motor LUE Deficits / Details: 4-/5 weakness, 3+/5 grip strength    Lower Extremity Assessment Lower Extremity Assessment: Generalized weakness    Cervical / Trunk Assessment Cervical / Trunk Assessment: Kyphotic  Communication   Communication Communication: Impaired Factors Affecting Communication: Difficulty expressing self;Reduced clarity of speech    Cognition Arousal: Alert Behavior During Therapy: Flat affect   PT - Cognitive impairments: Difficult to assess                         Following commands: Impaired Following commands impaired: Follows one step commands with increased time     Cueing Cueing Techniques: Verbal cues, Gestural cues, Tactile cues     General Comments General comments (skin integrity, edema, etc.): soiled linens, provided clean and changed out gown    Exercises     Assessment/Plan    PT Assessment Patient needs continued PT services  PT Problem List Decreased strength;Decreased range of motion;Decreased activity tolerance;Decreased balance;Decreased coordination;Decreased mobility;Decreased knowledge of use of DME       PT Treatment Interventions DME instruction;Gait training;Functional mobility training;Stair training;Therapeutic activities;Therapeutic exercise;Balance training    PT Goals (Current goals can be found in the Care Plan section)  Acute Rehab PT Goals Patient Stated Goal: none stated PT Goal Formulation: With patient Time For Goal Achievement: 05/26/24 Potential to Achieve Goals: Good    Frequency Min 2X/week     Co-evaluation               AM-PAC PT 6 Clicks Mobility  Outcome Measure Help needed turning from your back to your side while in a flat bed without  using bedrails?: A Little Help needed moving from lying on your back to sitting on the side of a flat bed without using bedrails?: A Little Help needed moving to and from a bed to a chair (including a wheelchair)?: A Little Help needed standing up from a chair using your arms (e.g., wheelchair or bedside chair)?: A Little Help needed to walk in hospital room?: A Little Help needed climbing 3-5 steps with a railing? : A Lot 6 Click Score: 17    End of Session Equipment Utilized During Treatment: Gait belt Activity Tolerance: Patient tolerated treatment well Patient left: in chair;with call bell/phone within reach;with chair alarm set;with family/visitor present Nurse Communication: Mobility status PT Visit Diagnosis: Unsteadiness on feet (R26.81);Other abnormalities of gait and mobility (R26.89);Muscle weakness (generalized) (M62.81);Difficulty in walking, not elsewhere classified (R26.2)    Time: 9056-8994 PT Time Calculation (min) (ACUTE ONLY): 22 min   Charges:   PT Evaluation $PT Eval Moderate Complexity: 1 Mod   PT General Charges $$ ACUTE PT VISIT: 1 Visit         Allena Bulls, SPT  Maryanne Finder, PT, DPT Physical Therapist - [facility]  [facility] 05/12/2024,  1:19 PM

## 2024-05-12 NOTE — [place] FL2 (Addendum)
 [place]  MEDICAID FL2 LEVEL OF CARE FORM     IDENTIFICATION  Patient Name: Juan Harper Birthdate: Nov 16, 1960 Sex: male Admission Date (Current Location): 05/11/2024  [facility] [place] Number:  Chiropodist and Address:  [facility], [address], [place], [place] [address]      Provider Number: [id number]  Attending Physician Name and Address:  Kathrin Mignon DASEN, MD  Relative Name and Phone Number:  Elvis Delon Ahumada, Emergency Contact  [phone]    Current Level of Care: Hospital Recommended Level of Care: Skilled Nursing Facility Prior Approval Number:    Date Approved/Denied:   PASRR Number:  [id number]  Discharge Plan: SNF    Current Diagnoses: Patient Active Problem List   Diagnosis Date Noted   Cerebral infarction (HCC) 05/11/2024   Fall at home, initial encounter 02/07/2021   Syncope 02/07/2021   Right hip pain 02/07/2021   BPH (benign prostatic hyperplasia)    Chronic diastolic CHF (congestive heart failure) (HCC)    AKI (acute kidney injury) 02/06/2021   Acute kidney injury 01/06/2018   Hyperkalemia 01/06/2018   Acute renal failure (ARF) 02/26/2017   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   Sleep apnea 11/24/2016   Diabetes (HCC) 11/24/2016   HTN (hypertension) 11/24/2016   Dizziness 11/24/2016   Acute on chronic renal failure 09/19/2016   Cervical disc disease with myelopathy 09/07/2016   Simple chronic bronchitis (HCC) 07/16/2016   Tobacco use 07/16/2016   Closed fracture of multiple ribs of left side 02/07/2016   BPH with obstruction/lower urinary tract symptoms 05/23/2014   Elevated PSA 05/23/2014   Erectile dysfunction 05/23/2014   Chest pain 12/10/2010    Orientation RESPIRATION BLADDER Height & Weight     Self, Time, Situation, Place    Continent Weight: 85.1 kg Height:  5' 11 (180.3 cm)  BEHAVIORAL SYMPTOMS/MOOD NEUROLOGICAL BOWEL NUTRITION STATUS       Continent    AMBULATORY STATUS COMMUNICATION OF NEEDS Skin   Extensive Assist Verbally                         Personal Care Assistance Level of Assistance  Bathing, Feeding, Dressing Bathing Assistance: Maximum assistance Feeding assistance: Limited assistance Dressing Assistance: Maximum assistance     Functional Limitations Info             SPECIAL CARE FACTORS FREQUENCY  PT (By licensed PT), OT (By licensed OT), Speech therapy     PT Frequency: 5 x week OT Frequency: 5 x week     Speech Therapy Frequency: 5 x week      Contractures      Additional Factors Info  Code Status, Allergies Code Status Info: FULL Allergies Info: BEE Venom           Current Medications (05/12/2024):  This is the current hospital active medication list Current Facility-Administered Medications  Medication Dose Route Frequency Provider Last Rate Last Admin   acetaminophen  (TYLENOL ) tablet 650 mg  650 mg Oral Q6H PRN Fernand Prost, MD       Or   acetaminophen  (TYLENOL ) suppository 650 mg  650 mg Rectal Q6H PRN Fernand Prost, MD       aspirin  EC tablet 81 mg  81 mg Oral Daily Gonfa, Taye T, MD   81 mg at 05/12/24 0950   clopidogrel  (PLAVIX ) tablet 75 mg  75 mg Oral Daily Khan, Ghalib, MD   75 mg at  05/12/24 0950   heparin  injection 5,000 Units  5,000 Units Subcutaneous Q8H Khan, Ghalib, MD   5,000 Units at 05/12/24 0500   insulin  aspart (novoLOG ) injection 0-5 Units  0-5 Units Subcutaneous QHS Khan, Ghalib, MD       insulin  aspart (novoLOG ) injection 0-9 Units  0-9 Units Subcutaneous TID WC Khan, Ghalib, MD   1 Units at 05/12/24 1248   ondansetron  (ZOFRAN ) tablet 4 mg  4 mg Oral Q6H PRN Fernand Prost, MD       Or   ondansetron  (ZOFRAN ) injection 4 mg  4 mg Intravenous Q6H PRN Fernand Prost, MD       rosuvastatin  (CRESTOR ) tablet 20 mg  20 mg Oral Daily Fernand Prost, MD   20 mg at 05/12/24 0950   senna-docusate (Senokot-S) tablet 1 tablet  1 tablet Oral QHS PRN Fernand Prost, MD        sodium chloride  flush (NS) 0.9 % injection 3 mL  3 mL Intravenous Q12H Khan, Ghalib, MD   3 mL at 05/12/24 9048     Discharge Medications: Please see discharge summary for a list of discharge medications.  Relevant Imaging Results:  Relevant Lab Results:   Additional Information [id number]  Dalia GORMAN Fuse, RN

## 2024-05-12 NOTE — TOC Progression Note (Signed)
 Transition of Care [facility]) - Progression Note    Patient Details  Name: Juan Harper MRN: [id number] Date of Birth: [birth year]  [facility] CM/SW Contact  Dalia GORMAN Fuse, RN Phone Number: 05/12/2024, 4:26 PM  Clinical Narrative:     TOC visitied the patient's room. The patient was sleeping peacefully. Therapy recs are for STR. TOC completed application for PASRR. The patient's last name is [name]. In [place] MUST.                    Expected Discharge Plan and Services                                               Social Drivers of Health (SDOH) Interventions SDOH Screenings   Food Insecurity: Patient Unable To Answer (05/12/2024)  Housing: Patient Unable To Answer (05/12/2024)  Transportation Needs: Patient Unable To Answer (05/12/2024)  Utilities: Patient Unable To Answer (05/12/2024)  Financial Resource Strain: High Risk (06/18/2023)   Received from [facility]  Social Connections: Patient Unable To Answer (05/12/2024)  Tobacco Use: High Risk (05/11/2024)    Readmission Risk Interventions     No data to display

## 2024-05-12 NOTE — Evaluation (Addendum)
 Clinical/Bedside Swallow Evaluation Patient Details  Name: Juan Harper MRN: [id number] Date of Birth: 10/10/1960  Today's Date: 05/12/2024 Time: SLP Start Time (ACUTE ONLY): 0900 SLP Stop Time (ACUTE ONLY): 0920 SLP Time Calculation (min) (ACUTE ONLY): 20 min  Past Medical History:  Past Medical History:  Diagnosis Date   Arthritis    NECK/ RIGHT KNEE   Asthma    Bell's palsy    BPH (benign prostatic hyperplasia)    Cough    chronic   Diabetes mellitus without complication (HCC)    Edema    legs/feet   Fusion of spine of cervical region    PLANNED FOR APRIL   HOH (hard of hearing)    Hypertension    Orthopnea    Shortness of breath dyspnea    Sleep apnea    cpap   Wears dentures    partial upper and lower   Wheezing    Past Surgical History:  Past Surgical History:  Procedure Laterality Date   BACK SURGERY     CATARACT EXTRACTION W/PHACO Right 07/31/2016   Procedure: CATARACT EXTRACTION PHACO AND INTRAOCULAR LENS PLACEMENT (IOC);  Surgeon: Adine Oneil Novak, MD;  Location: [facility] ORS;  Service: Ophthalmology;  Laterality: Right;  Lot # [id number] H US :00:26.9 AP%:6.8% CDE: 1.83   CATARACT EXTRACTION W/PHACO Left 06/24/2021   Procedure: CATARACT EXTRACTION PHACO AND INTRAOCULAR LENS PLACEMENT (IOC) LEFT DIABETIC;  Surgeon: Novak Adine Oneil, MD;  Location: [facility];  Service: Ophthalmology;  Laterality: Left;  3.35 0:23.9   COLONOSCOPY     NECK MASS EXCISION     PILONIDAL CYST EXCISION     HPI:  Per H&P, Juan Harper is a 63 y.o. year old male with medical history of HTN, HLD, DMII presenting to the ED after having worsening slurred speech. MRI: 1. Acute nonhemorrhagic 10mm infarct in the lateral aspect of the thalamus and  posterior limb of the left internal capsule.  2. Two acute punctate infarcts in the posterior limb of the right internal  capsule.  3. Moderate atrophy and confluent periventricular white matter changes  extending to the subcortical regions  bilaterally. This most likely reflects the  sequelae of chronic microvascular ischemia.  4. Remote lacunar infarcts in the inferior cerebellum bilaterally and remote  ischemic changes extending into the brainstem.    Assessment / Plan / Recommendation  Clinical Impression  Pt seen for bedside swallow assessment in the setting of acute to subacute CVA. No history of dysphagia in chart. Right facial/labial weakness noted. Lingual function grossly intact. Pt on room air and reports eagerness for food. Trials completed of thin liquids, nectar thick liquids, and puree. Immediate/delayed cough, delayed throat clear, multiple swallows, and intermittent wet vocal quality noted with both liquids. Pt without overt s/sx of aspiration with puree trials.  Oral phase notable for irregular lingual pumping to facilitate A-P transfer and anterior loss of liquids to the right. Pt edentulous at baseline- dentures not present in the room.   Based on acute neuro findings and current deconditioning, pt is at increased risk for aspiration. Further, given intermittent s/sx of aspiration with both liquids, recommend NPO- with allowance of medications in puree and ice chips following oral care. Recommendations shared with RN and MD and written on board. Limited carryover of education noted with pt- therefore, recommend supervision for ice chips to ensure safe, slow intake. Pause for recovery in the setting of cough response with ice. SLP will monitor for readiness for PO diet vs instrumental assessment.  SLP Visit Diagnosis: Dysphagia, unspecified (R13.10) (related to acute CVA)    Aspiration Risk  Moderate aspiration risk    Diet Recommendation   NPO  Medication Administration: Whole meds with puree    Other Recommendations Oral Care Recommendations: Oral care QID;Oral care prior to ice chip/H20;Staff/trained caregiver to provide oral care      Assistance Recommended at Discharge  Supervision assistance expected for PO  intake  Functional Status Assessment Patient has had a recent decline in their functional status and demonstrates the ability to make significant improvements in function in a reasonable and predictable amount of time.  Frequency and Duration min 2x/week  2 weeks       Prognosis Prognosis for improved oropharyngeal function: Good      Swallow Study   General Date of Onset: 05/12/24 HPI: Per H&P, Juan Harper is a 63 y.o. year old male with medical history of HTN, HLD, DMII presenting to the ED after having worsening slurred speech. MRI: 1. Acute nonhemorrhagic 10mm infarct in the lateral aspect of the thalamus and  posterior limb of the left internal capsule.  2. Two acute punctate infarcts in the posterior limb of the right internal  capsule.  3. Moderate atrophy and confluent periventricular white matter changes  extending to the subcortical regions bilaterally. This most likely reflects the  sequelae of chronic microvascular ischemia.  4. Remote lacunar infarcts in the inferior cerebellum bilaterally and remote  ischemic changes extending into the brainstem. Type of Study: Bedside Swallow Evaluation Previous Swallow Assessment: none in chart Diet Prior to this Study: NPO Temperature Spikes Noted: No Respiratory Status: Room air History of Recent Intubation: No Behavior/Cognition: Alert;Distractible Oral Cavity Assessment: Within Functional Limits Oral Care Completed by SLP: Yes Oral Cavity - Dentition: Edentulous (dentures at home) Vision: Functional for self-feeding Self-Feeding Abilities: Able to feed self Patient Positioning: Upright in bed Baseline Vocal Quality: Low vocal intensity;Wet Volitional Cough: Wet Volitional Swallow: Able to elicit    Oral/Motor/Sensory Function Overall Oral Motor/Sensory Function: Mild impairment Facial ROM: Reduced right Facial Symmetry: Abnormal symmetry right Facial Strength: Reduced right Lingual ROM: Within Functional Limits Lingual  Symmetry: Within Functional Limits Lingual Strength: Within Functional Limits Velum: Within Functional Limits Mandible: Within Functional Limits   Ice Chips Ice chips: Impaired Presentation: Cup Oral Phase Impairments: Impaired mastication Oral Phase Functional Implications: Prolonged oral transit Pharyngeal Phase Impairments: Cough - Immediate;Cough - Delayed   Thin Liquid Thin Liquid: Impaired Presentation: Cup;Self Fed Oral Phase Impairments: Reduced labial seal Pharyngeal  Phase Impairments: Throat Clearing - Immediate;Cough - Immediate;Cough - Delayed    Nectar Thick Nectar Thick Liquid: Impaired Presentation: Cup;Self Fed Pharyngeal Phase Impairments: Cough - Delayed;Throat Clearing - Delayed   Honey Thick Honey Thick Liquid: Not tested   Puree Puree: Within functional limits Presentation: Self Fed;Spoon   Solid     Solid: Not tested     Conway Fedora Clapp, MS, CCC-SLP Speech Language Pathologist Rehab Services; [facility] [phone] (ascom)   Kasen Sako J Clapp 05/12/2024,10:59 AM

## 2024-05-13 ENCOUNTER — Inpatient Hospital Stay: Admit: 2024-05-13 | Discharge: 2024-05-13 | Disposition: A | Attending: Internal Medicine

## 2024-05-13 DIAGNOSIS — I639 Cerebral infarction, unspecified: Secondary | ICD-10-CM | POA: Diagnosis not present

## 2024-05-13 DIAGNOSIS — N400 Enlarged prostate without lower urinary tract symptoms: Secondary | ICD-10-CM | POA: Diagnosis not present

## 2024-05-13 DIAGNOSIS — I6381 Other cerebral infarction due to occlusion or stenosis of small artery: Secondary | ICD-10-CM | POA: Diagnosis not present

## 2024-05-13 DIAGNOSIS — R29705 NIHSS score 5: Secondary | ICD-10-CM

## 2024-05-13 DIAGNOSIS — I5032 Chronic diastolic (congestive) heart failure: Secondary | ICD-10-CM | POA: Diagnosis not present

## 2024-05-13 LAB — PHOSPHORUS: Phosphorus: 2.8 mg/dL (ref 2.5–4.6)

## 2024-05-13 LAB — GLUCOSE, CAPILLARY
Glucose-Capillary: 100 mg/dL — ABNORMAL HIGH (ref 70–99)
Glucose-Capillary: 108 mg/dL — ABNORMAL HIGH (ref 70–99)
Glucose-Capillary: 119 mg/dL — ABNORMAL HIGH (ref 70–99)
Glucose-Capillary: 130 mg/dL — ABNORMAL HIGH (ref 70–99)
Glucose-Capillary: 181 mg/dL — ABNORMAL HIGH (ref 70–99)
Glucose-Capillary: 68 mg/dL — ABNORMAL LOW (ref 70–99)

## 2024-05-13 LAB — ECHOCARDIOGRAM COMPLETE BUBBLE STUDY
AR max vel: 2.34 cm2
AV Area VTI: 2.38 cm2
AV Area mean vel: 2.22 cm2
AV Mean grad: 3 mmHg
AV Peak grad: 4.8 mmHg
Ao pk vel: 1.1 m/s
Area-P 1/2: 3.17 cm2
Calc EF: 71.8 %
MV VTI: 2.08 cm2
S' Lateral: 4.8 cm
Single Plane A2C EF: 80.2 %
Single Plane A4C EF: 60.3 %

## 2024-05-13 LAB — MAGNESIUM: Magnesium: 1.7 mg/dL (ref 1.7–2.4)

## 2024-05-13 MED ORDER — VITAL HP 1.0 CAL PO LIQD: 1000.0000 mL | ORAL | Status: DC

## 2024-05-13 MED ORDER — DEXTROSE-SODIUM CHLORIDE 5-0.9 % IV SOLN: INTRAVENOUS | Status: AC

## 2024-05-13 MED ORDER — CLOPIDOGREL BISULFATE 75 MG PO TABS
75.0000 mg | ORAL_TABLET | Freq: Every day | ORAL | Status: DC
  Administered 2024-05-14 – 2024-05-17 (×4): 75 mg
  Filled 2024-05-13 (×5): qty 1

## 2024-05-13 MED ORDER — ATORVASTATIN CALCIUM 20 MG PO TABS
80.0000 mg | ORAL_TABLET | Freq: Every day | ORAL | Status: DC
  Administered 2024-05-14 – 2024-05-23 (×10): 80 mg
  Filled 2024-05-13 (×2): qty 1
  Filled 2024-05-13: qty 4
  Filled 2024-05-13 (×3): qty 1
  Filled 2024-05-13 (×2): qty 4
  Filled 2024-05-13: qty 1
  Filled 2024-05-13: qty 4
  Filled 2024-05-13: qty 1
  Filled 2024-05-13: qty 4
  Filled 2024-05-13 (×2): qty 1
  Filled 2024-05-13: qty 4

## 2024-05-13 MED ORDER — ADULT MULTIVITAMIN W/MINERALS CH
1.0000 | ORAL_TABLET | Freq: Every day | ORAL | Status: DC
  Administered 2024-05-14 – 2024-05-23 (×10): 1
  Filled 2024-05-13 (×9): qty 1

## 2024-05-13 MED ORDER — SENNOSIDES-DOCUSATE SODIUM 8.6-50 MG PO TABS
1.0000 | ORAL_TABLET | Freq: Every evening | ORAL | Status: DC | PRN
  Administered 2024-05-22: 1
  Filled 2024-05-13: qty 1

## 2024-05-13 MED ORDER — THIAMINE MONONITRATE 100 MG PO TABS
100.0000 mg | ORAL_TABLET | Freq: Every day | ORAL | Status: AC
  Administered 2024-05-14 – 2024-05-19 (×6): 100 mg
  Filled 2024-05-13 (×6): qty 1

## 2024-05-13 MED ORDER — FREE WATER
30.0000 mL | Status: DC
  Administered 2024-05-14 – 2024-05-18 (×19): 30 mL

## 2024-05-13 MED ORDER — ASPIRIN 81 MG PO CHEW
81.0000 mg | CHEWABLE_TABLET | Freq: Every day | ORAL | Status: DC
  Administered 2024-05-14 – 2024-05-23 (×10): 81 mg
  Filled 2024-05-13 (×9): qty 1

## 2024-05-13 MED ORDER — ATORVASTATIN CALCIUM 80 MG PO TABS: 80.0000 mg | ORAL_TABLET | Freq: Every day | ORAL | Status: DC

## 2024-05-13 MED ORDER — POLYETHYLENE GLYCOL 3350 17 G PO PACK
17.0000 g | PACK | Freq: Two times a day (BID) | ORAL | Status: DC | PRN
  Administered 2024-05-20: 17 g
  Filled 2024-05-13: qty 1

## 2024-05-13 MED ORDER — PROSOURCE TF20 ENFIT COMPATIBL EN LIQD
60.0000 mL | Freq: Two times a day (BID) | ENTERAL | Status: DC
  Administered 2024-05-14 – 2024-05-23 (×17): 60 mL
  Filled 2024-05-13 (×2): qty 60

## 2024-05-13 MED ORDER — OSMOLITE 1.5 CAL PO LIQD
1000.0000 mL | ORAL | Status: DC
  Administered 2024-05-13 – 2024-05-21 (×7): 1000 mL

## 2024-05-13 NOTE — Plan of Care (Signed)
" °  Problem: Education: Goal: Knowledge of patient specific risk factors will improve (DELETE if not current risk factor) Outcome: Progressing   Problem: Coping: Goal: Will verbalize positive feelings about self Outcome: Progressing Goal: Will identify appropriate support needs Outcome: Progressing   Problem: Self-Care: Goal: Ability to communicate needs accurately will improve Outcome: Progressing   "

## 2024-05-13 NOTE — Progress Notes (Signed)
 Initial Nutrition Assessment  DOCUMENTATION CODES:   Severe malnutrition in context of social or environmental circumstances  INTERVENTION:   -RD will follow for diet advancement and further goals of care discussions -TF via NGT (once placed and verified):   Initiate Osmolite 1.5 @ 20 ml/hr and increase by 10 ml every 12 hours to goal rate of 60 ml/hr.   60 ml Prosource TF20 BID    30 ml free water flush every 4 hours  Tube feeding regimen provides 2320 kcal (100% of needs), 130 grams of protein, and 1097 ml of H2O. Total free water: 1277 ml daily  -MVI with minerals daily via tube -100 mg thiamine daily x 7 days via tube  -Monitor Mg, K, and Phos and replete as needed  -If patient experiences persistent hyperglycemia (defined as >=180 mg/dL (89.9 mmol/L on two or more occasions), recommend considering initiation of insulin  therapy and/or consulting diabetes coordinator team for further recommendations   NUTRITION DIAGNOSIS:   Severe Malnutrition related to social / environmental circumstances as evidenced by moderate fat depletion, severe fat depletion, moderate muscle depletion, severe muscle depletion.  GOAL:   Patient will meet greater than or equal to 90% of their needs  MONITOR:   Diet advancement  REASON FOR ASSESSMENT:   Consult Assessment of nutrition requirement/status  ASSESSMENT:   63 y.o. year old male with medical history of HTN, HLD, DMII presenting after having worsening slurred speech.  Patient admitted with acute ischemic stroke.   12/18- s/p BSE- NPO 12/19- s/p BSE- NPO  Reviewed I/O's: +806 ml x 24 hours  Per neurology, CT angio head and neck showed moderate to severe bilateral ICA stenosis of the cavernous segments, moderate left A2 stenosis, severe multifocal left P2 and distal right P3 stenosis., Focal moderate to severe stenosis involving the proximal left P1 segment and focal severe stenosis involving the distal left V4 segment.   Per  chart review, patient's family is concerned about patient's living situation as he has a roommate who is a poor influence on him. Tox screen is pending.   Case discussed with RN, MD, and SLP. Per SLP, plan to remains NPO, but will follow-up over the weekend to assess progress. SLP reports high risk for aspiration and inability to meet nutritional needs.   Patient lying in bed at time of visit. He was lethargic, but did wake up briefly. He was unable to answer questions. No family present to provide additional history.   Noted patient with distant history of weight loss. Reviewed CareEverywhere; patient was 203# on 07/14/23. Patient has experienced a 7.9% weight loss over the past 10 months. While this is not significant for time frame, it is concerning secondary to malnutrition, social and environmental circumstances, and NPO status.   Case discussed with MD, RN, and SLP. Discussed recommendation for NGT if unable to take PO's. MD ordered NGT placement. RD discussed TF orders and recommendations for once TF is placed and verified.   Medications reviewed and include dextrose  5%-0.9% sodium chloride  infusion @ 75 ml/hr.   Lab Results  Component Value Date   HGBA1C 8.1 (H) 05/11/2024   PTA DM medications are 10 mg farxiga  daily, 1000 mg metformin  BID, 0-6 units insulin  aspart TID. Per CareEverywhere notes, PCP reports history of suboptimally controlled DM- prior Hgb A1c 7.7.   Labs reviewed: CBGS: 63-148 (inpatient orders for glycemic control are 0-5 units insulin  aspart daily at bedtime and 0-9 units insulin  aspart TID with meals).    NUTRITION - FOCUSED  PHYSICAL EXAM:  Flowsheet Row Most Recent Value  Orbital Region Severe depletion  Upper Arm Region Moderate depletion  Thoracic and Lumbar Region Moderate depletion  Buccal Region Severe depletion  Temple Region Severe depletion  Clavicle Bone Region Moderate depletion  Clavicle and Acromion Bone Region Moderate depletion  Scapular Bone  Region Moderate depletion  Dorsal Hand Severe depletion  Patellar Region Severe depletion  Anterior Thigh Region Severe depletion  Posterior Calf Region Severe depletion  Edema (RD Assessment) Mild  Hair Reviewed  Eyes Reviewed  Mouth Reviewed  Skin Reviewed  Nails Reviewed    Diet Order:   Diet Order             Diet NPO time specified Except for: Ice Chips, Sips with Meds  Diet effective now                   EDUCATION NEEDS:   Not appropriate for education at this time  Skin:  Skin Assessment: Reviewed RN Assessment  Last BM:  05/11/24  Height:   Ht Readings from Last 1 Encounters:  05/11/24 5' 11 (1.803 m)    Weight:   Wt Readings from Last 1 Encounters:  05/12/24 85.1 kg    Ideal Body Weight:  78.2 kg  BMI:  Body mass index is 26.17 kg/m.  Estimated Nutritional Needs:   Kcal:  7649-7449  Protein:  115-130 grams  Fluid:  2.0-2.2 L    Margery ORN, RD, LDN, CDCES Registered Dietitian III Certified Diabetes Care and Education Specialist If unable to reach this RD, please use RD Inpatient group chat on secure chat between hours of 8am-4 pm daily

## 2024-05-13 NOTE — TOC Progression Note (Addendum)
 Transition of Care [facility]) - Progression Note    Patient Details  Name: Juan Harper MRN: [id number] Date of Birth: April 01, 1961  [facility] CM/SW Contact  Dalia GORMAN Fuse, RN Phone Number: 05/13/2024, 11:51 AM  Clinical Narrative:     SLP recommending NPO except for sips with meds. Checking UDS today.  Therapy recs are for SNF. FL2 sent out in [place] Bed offers from [facility] and [facility].  13:45: patient not alert, TOC placed call to patient's sister, but did not receive an answer.     1448: TOC placed call to patient's sister Juan Harper [phone] and didn't receive an answer.                    Expected Discharge Plan and Services                                               Social Drivers of Health (SDOH) Interventions SDOH Screenings   Food Insecurity: Patient Unable To Answer (05/12/2024)  Housing: Patient Unable To Answer (05/12/2024)  Transportation Needs: Patient Unable To Answer (05/12/2024)  Utilities: Patient Unable To Answer (05/12/2024)  Financial Resource Strain: High Risk (06/18/2023)   Received from [facility]  Social Connections: Patient Unable To Answer (05/12/2024)  Tobacco Use: High Risk (05/11/2024)    Readmission Risk Interventions     No data to display

## 2024-05-13 NOTE — Plan of Care (Signed)
 SLP recommended strict n.p.o. due to risk of aspiration.  Discussed with patient about feeding tube, NG. He expressed his agreement by nodding yes.   Notified patient's sister over the phone who is also in agreement.  Ordered NGT.  Notified SLP and RD.

## 2024-05-13 NOTE — Progress Notes (Deleted)
 Neurology consulted for acute ischemic stroke. This is a 63 year old M with PMH of DM-2, HTN, HLD and cocaine use brought to ED due to slurred speech and difficulty speaking x24 hrs.  MRI brain showed acute nonhemorrhagic 10 mm infarct in the lateral aspect of thalamus and posterior limb of left IC, two acute punctate infarct in posterior limb of right IC and remote bilateral cerebral and brainstem CVA.  Stroke workup this admission:   CT angio head and neck showed moderate to severe bilateral ICA stenosis of the cavernous segments, moderate left A2 stenosis, severe multifocal left P2 and distal right P3 stenosis.,  Focal moderate to severe stenosis involving the proximal left P1 segment and focal severe stenosis involving the distal left V4 segment.   TTE pending  Stroke Labs     Component Value Date/Time   CHOL 212 (H) 05/11/2024 1320   TRIG 87 05/11/2024 1320   HDL 41 05/11/2024 1320   CHOLHDL 5.2 05/11/2024 1320   VLDL 17 05/11/2024 1320   LDLCALC 154 (H) 05/11/2024 1320    Lab Results  Component Value Date/Time   HGBA1C 8.1 (H) 05/11/2024 01:20 PM   Initial recommendations: - Permissive HTN x48 hrs from sx onset goal BP <220/110. PRN labetalol or hydralazine if BP above these parameters. Avoid oral antihypertensives. - TTE w/ bubble - ASA 81mg  daily + plavix  75mg  daily x90 days f/b ASA 81mg  daily monotherapy after that - Atorvastatin  80mg  daily - q4 hr neuro checks - STAT head CT for any change in neuro exam - Tele - PT/OT/SLP - Stroke education - Amb referral to neurology upon discharge   Neurology will see patient in formal consultation tomorrow morning.  Elida Ross, MD [facility] [phone]  If 7pm- 7am, please page neurology on call as listed in AMION.

## 2024-05-13 NOTE — Progress Notes (Signed)
 PT Cancellation Note  Patient Details Name: Juan Harper MRN: [id number] DOB: Aug 30, 1960   Cancelled Treatment:     Pt declined due to c/o fatigue and stating he is receiving NGT. Will re-attempt next available date/time per POC.   Darice JAYSON Bohr 05/13/2024, 4:35 PM

## 2024-05-13 NOTE — Progress Notes (Signed)
 Speech Language Pathology Treatment: Dysphagia  Patient Details Name: Juan Harper MRN: [id number] DOB: [birth year] Today's Date: 05/13/2024 Time: 8684-8644 SLP Time Calculation (min) (ACUTE ONLY): 40 min  Assessment / Plan / Recommendation Clinical Impression  Pt was seen for ongoing Dysphagia therapy w/ therapeutic po trials. Pt was awake and acknowledged this SLP's greeting upon entering room. Noted a strong R lean tendency in the bed; he required full support for midline sitting but still leaned to the R given time.  Min verbalizations to direct questions re: self were asked in a 1-word response format. Intelligibility of speech was Severely declined, <25% intelligible to the unfamiliar listener. Severe Dysarthria. Per chart note, this has not improved since yesterday.  Pt is on RA, afebrile. WBC not elevated.   For Dysphagia tx, therapeutic po trials of single ice chips, tsps of Nectar liquids, and 1/3 tsps of puree were given. Pt exhibited Severe oropharyngeal phase Dysphagia c/b intermittent oral holding post bolus acceptance, increased lingual movement during bolus manipulation, and lingual protrusion during the movements and swallowing. Oral clearing b/t boluses appeared WFL. During the pharyngeal phase of swallowing, deficits were c/b intermittent velopharyngeal incompetency during some swallows, lingual protrusion during the swallow, multiple swallows(2 most often) w/ boluses, and coughing Post the trials -- suspect possible impact from increased saliva/secretions w/ the po's as well as pharyngeal residue. The coughing occurred w/ ALL trial consistencies. Pt's nonvolitional cough response was exaggerated; volitional cough was ineffective. Velopharyngeal incompetency was noted during certain speech. Pt's gag reflex was slight>none especially on R side. Increased Saliva was noted post trials but No drooling -- cued pt to lingual sweep and Dry swallow to clear, which he was able to do w/ min  deliberate/slower movements.   In setting of his new stroke, including the internal capsule aspect, severe Dysphagia can be anticipated. He does not appear to have had any spontaneous improvement since assessment yesterday AND pt was symptomatic for ~24 hours PTA per chart notes.  Pt appears at HIGH risk for aspiration/aspiration pneumonia at this time. Also, he appears at risk for inability to meet his nutritional needs orally.    Recommend strict NPO status at this time d/t his HIGH risk for aspiration and pulmonary decline. ST services will continue to work w/ pt w/ therapeutic po trials/Dysphagia therapy and monitor for any improvement in his swallowing to indicate appropriateness and safety for oral intake. Alternative means of feeding (NGT) may need to be considered for nutritional support in the meantime.  The above was d/w MD and Team. NSG updated as to the recommendation for frequent oral care for hygiene and stimulation of oropharyngeal swallowing; aspiration precautions. MD/NSG agreed. Dietician is now following.          HPI HPI: Per H&P, Juan Harper is a 63 y.o. year old male with medical history of HTN, HLD, DMII,  presenting to the ED after having worsening slurred speech. MRI: 1. Acute nonhemorrhagic 10mm infarct in the lateral aspect of the thalamus and  posterior limb of the left internal capsule.  2. Two acute punctate infarcts in the posterior limb of the right internal  capsule.  3. Moderate atrophy and confluent periventricular white matter changes  extending to the subcortical regions bilaterally. This most likely reflects the  sequelae of chronic microvascular ischemia.  4. Remote lacunar infarcts in the inferior cerebellum bilaterally and remote  ischemic changes extending into the brainstem.     Patient was symptomatic for over 24 hours before arrival to  ED.  Family is concerned about his living situation.  Prior UDS positive for Cocaine, marijuana; current labs  pending.       SLP Plan  Continue with current plan of care        Swallow Evaluation Recommendations   Recommendations: NPO (possible need for NGT (per Dietician)) Medication Administration: Via alternative means Oral care recommendations: Oral care QID (4x/day);Staff/trained caregiver to provide oral care (Position upright; aspiration precautions) Recommended consults: Consider dietitian consultation;Consider Palliative care     Recommendations   See above                 (TBD) Oral care QID;Staff/trained caregiver to provide oral care (Sit upright; aspiration precautions)   Frequent or constant Supervision/Assistance Dysphagia, oropharyngeal phase (R13.12) (Severe Dysarthria)     Continue with current plan of care        Comer Portugal, MS, CCC-SLP Speech Language Pathologist Rehab Services; [facility] [place] [phone] (ascom) Sheronda Parran  05/13/2024, 2:07 PM

## 2024-05-13 NOTE — Progress Notes (Signed)
 " PROGRESS NOTE  Juan Harper FMW:[id number] DOB: Jan 12, 1961   PCP: Sharl Delon NOVAK, NP  Patient is from: Home.  Has a roommate.  DOA: 05/11/2024 LOS: 1  Chief complaints Chief Complaint  Patient presents with   Weakness     Brief Narrative / Interim history: 63 year old M with PMH of DM-2, HTN, HLD and cocaine use brought to ED due to slurred speech and difficulty speaking.  Patient was symptomatic for over 24 hours before arrival to ED  In ED, stable vitals.  Labs without significant finding.  EtOH level negative.  CT head showed acute/subacute stroke in left internal capsule with remote bilateral thalamic strokes.  Neurology consulted.    MRI brain showed acute nonhemorrhagic 10 mm infarct in the lateral aspect of thalamus and posterior limb of left IC, two acute punctate infarct in posterior limb of right IC and remote bilateral cerebral and brainstem CVA.  CT angio head and neck showed moderate to severe bilateral ICA stenosis of the cavernous segments, moderate left A2 stenosis, severe multifocal left P2 and distal right P3 stenosis.,  Focal moderate to severe stenosis involving the proximal left P1 segment and focal severe stenosis involving the distal left V4 segment.   TTE pending.  Subjective: Seen and examined earlier this morning.  No major events overnight or this morning.  No complaints but he has dense expressive aphasia/dysarthria to express himself.  Responds no to pain.   Assessment and plan: Acute lacunar infarction: Presents with slurred speech.  Has right facial droop and relative weakness in left leg compared to right.  Imaging including CT head, MRI brain and CT angio head and neck as above.  LDL 154.  A1c 8.1%. -Neurology recommends aspirin  and Plavix  for 3 months followed by aspirin  monotherapy -Lipitor  80 mg daily -SLP recommended NPO except ice chips and sips with meds. -PT/OT recommended SNF. -Follow echocardiogram -Follow UDS.  RN to send urine  sample -Follow further neuro recommendation  NIDDM-2 with hyperglycemia: A1c 8.1%. Recent Labs  Lab 05/12/24 1205 05/12/24 1605 05/12/24 1814 05/12/24 2150 05/13/24 0731  GLUCAP 148* 63* 97 102* 100*  - Continue SSI-sensitive - Hold home p.o. meds.  Essential hypertension: BP within acceptable range. -Neuro recommends permissive hypertension for 48 hours.   History of polysubstance use: Prior UDS positive for cocaine marijuana -Check UDS.  Requested RN to send urine sample  BPH: Does not seem to be on meds.  Concern about his living condition: Family concerned about living situation.  Patient lives with roommate. - TOC consulted to explore  further.  Body mass index is 26.17 kg/m.          DVT prophylaxis:  heparin  injection 5,000 Units Start: 05/11/24 1600  Code Status: Full code Family Communication: None at bedside Level of care: Telemetry Status is: Inpatient The patient will remain inpatient because: Acute CVA with dysarthria left leg weakness   Final disposition: SNF   55 minutes with more than 50% spent in reviewing records, counseling patient/family and coordinating care.  Consultants:  Neurology  Procedures: None  Microbiology summarized: None  Objective: Vitals:   05/12/24 1956 05/12/24 2357 05/13/24 0359 05/13/24 0730  BP: (!) 151/78 (!) 175/80 (!) 154/90 (!) 141/86  Pulse: 81 88 81 78  Resp: 20 18  18   Temp: 98 F (36.7 C) 97.8 F (36.6 C) 98.5 F (36.9 C) 97.9 F (36.6 C)  TempSrc: Oral     SpO2: 95% 97% 97% 97%  Weight:  Height:        Examination:  GENERAL: No apparent distress.  Nontoxic. HEENT: MMM.  Vision and hearing grossly intact.  NECK: Supple.  No apparent JVD.  RESP:  No IWOB.  Fair aeration bilaterally. CVS:  RRR. Heart sounds normal.  ABD/GI/GU: BS+. Abd soft, NTND.  MSK/EXT:  Moves extremities. No apparent deformity. No edema.  SKIN: no apparent skin lesion or wound NEURO: Awake and alert.  Oriented  failure.Severe dysarthria.  Difficult to understand speech.  Right facial droop.  Motor 3+/5 in LLE.  Light sensation intact in all dermatomes. Patellar reflex symmetric.  No pronator drift.  Finger to nose intact.  No nystagmus. PSYCH: Calm. Normal affect.   Sch Meds:  Scheduled Meds:  aspirin  EC  81 mg Oral Daily   clopidogrel   75 mg Oral Daily   heparin   5,000 Units Subcutaneous Q8H   insulin  aspart  0-5 Units Subcutaneous QHS   insulin  aspart  0-9 Units Subcutaneous TID WC   rosuvastatin   20 mg Oral Daily   sodium chloride  flush  3 mL Intravenous Q12H   Continuous Infusions:  dextrose  5 % and 0.9 % NaCl 75 mL/hr at 05/13/24 0649   PRN Meds:.acetaminophen  **OR** acetaminophen , ondansetron  **OR** ondansetron  (ZOFRAN ) IV, senna-docusate  Antimicrobials: Anti-infectives (From admission, onward)    None        I have personally reviewed the following labs and images: CBC: Recent Labs  Lab 05/11/24 1320 05/12/24 0547  WBC 6.9 5.4  NEUTROABS 4.7  --   HGB 14.2 13.0  HCT 42.4 39.5  MCV 85.7 86.1  PLT 283 265   BMP &GFR Recent Labs  Lab 05/11/24 1320 05/12/24 0547  NA 139 140  K 4.4 4.0  CL 102 103  CO2 23 22  GLUCOSE 156* 76  BUN 22 21  CREATININE 1.09 0.99  CALCIUM  9.4 9.3  MG 1.8  --    Estimated Creatinine Clearance: 79.2 mL/min (by C-G formula based on SCr of 0.99 mg/dL). Liver & Pancreas: Recent Labs  Lab 05/11/24 1320  AST 31  ALT 20  ALKPHOS 100  BILITOT 0.6  PROT 7.6  ALBUMIN 3.8   No results for input(s): LIPASE, AMYLASE in the last 168 hours. No results for input(s): AMMONIA in the last 168 hours. Diabetic: Recent Labs    05/11/24 1320  HGBA1C 8.1*   Recent Labs  Lab 05/12/24 1205 05/12/24 1605 05/12/24 1814 05/12/24 2150 05/13/24 0731  GLUCAP 148* 63* 97 102* 100*   Cardiac Enzymes: No results for input(s): CKTOTAL, CKMB, CKMBINDEX, TROPONINI in the last 168 hours. No results for input(s): PROBNP in the last  8760 hours. Coagulation Profile: Recent Labs  Lab 05/11/24 1320  INR 1.0   Thyroid Function Tests: No results for input(s): TSH, T4TOTAL, FREET4, T3FREE, THYROIDAB in the last 72 hours. Lipid Profile: Recent Labs    05/11/24 1320  CHOL 212*  HDL 41  LDLCALC 154*  TRIG 87  CHOLHDL 5.2   Anemia Panel: No results for input(s): VITAMINB12, FOLATE, FERRITIN, TIBC, IRON, RETICCTPCT in the last 72 hours. Urine analysis:    Component Value Date/Time   COLORURINE STRAW (A) 02/03/2023 1630   APPEARANCEUR CLEAR (A) 02/03/2023 1630   LABSPEC 1.022 02/03/2023 1630   PHURINE 6.0 02/03/2023 1630   GLUCOSEU >=500 (A) 02/03/2023 1630   HGBUR NEGATIVE 02/03/2023 1630   BILIRUBINUR NEGATIVE 02/03/2023 1630   KETONESUR NEGATIVE 02/03/2023 1630   PROTEINUR 30 (A) 02/03/2023 1630   NITRITE NEGATIVE 02/03/2023 1630  LEUKOCYTESUR NEGATIVE 02/03/2023 1630   Sepsis Labs: Invalid input(s): PROCALCITONIN, LACTICIDVEN  Microbiology: No results found for this or any previous visit (from the past 240 hours).  Radiology Studies: No results found.     Achaia Garlock T. Grasiela Jonsson [facility]  If 7PM-7AM, please contact night-coverage [email or web address] 05/13/2024, 11:42 AM   "

## 2024-05-14 ENCOUNTER — Inpatient Hospital Stay

## 2024-05-14 ENCOUNTER — Other Ambulatory Visit: Payer: Self-pay

## 2024-05-14 DIAGNOSIS — I639 Cerebral infarction, unspecified: Secondary | ICD-10-CM | POA: Diagnosis not present

## 2024-05-14 DIAGNOSIS — N400 Enlarged prostate without lower urinary tract symptoms: Secondary | ICD-10-CM | POA: Diagnosis not present

## 2024-05-14 DIAGNOSIS — I5032 Chronic diastolic (congestive) heart failure: Secondary | ICD-10-CM | POA: Diagnosis not present

## 2024-05-14 DIAGNOSIS — I6381 Other cerebral infarction due to occlusion or stenosis of small artery: Secondary | ICD-10-CM | POA: Diagnosis not present

## 2024-05-14 DIAGNOSIS — R29705 NIHSS score 5: Secondary | ICD-10-CM | POA: Diagnosis not present

## 2024-05-14 DIAGNOSIS — E43 Unspecified severe protein-calorie malnutrition: Secondary | ICD-10-CM | POA: Insufficient documentation

## 2024-05-14 LAB — URINE DRUG SCREEN
Amphetamines: NEGATIVE
Barbiturates: NEGATIVE
Benzodiazepines: NEGATIVE
Cocaine: POSITIVE — AB
Fentanyl: NEGATIVE
Methadone Scn, Ur: NEGATIVE
Opiates: NEGATIVE
Tetrahydrocannabinol: NEGATIVE

## 2024-05-14 LAB — GLUCOSE, CAPILLARY
Glucose-Capillary: 109 mg/dL — ABNORMAL HIGH (ref 70–99)
Glucose-Capillary: 108 mg/dL — ABNORMAL HIGH (ref 70–99)
Glucose-Capillary: 120 mg/dL — ABNORMAL HIGH (ref 70–99)
Glucose-Capillary: 189 mg/dL — ABNORMAL HIGH (ref 70–99)
Glucose-Capillary: 125 mg/dL — ABNORMAL HIGH (ref 70–99)
Glucose-Capillary: 121 mg/dL — ABNORMAL HIGH (ref 70–99)
Glucose-Capillary: 122 mg/dL — ABNORMAL HIGH (ref 70–99)

## 2024-05-14 LAB — RENAL FUNCTION PANEL
Albumin: 3.6 g/dL (ref 3.5–5.0)
Anion gap: 10 (ref 5–15)
BUN: 12 mg/dL (ref 8–23)
CO2: 25 mmol/L (ref 22–32)
Calcium: 9.1 mg/dL (ref 8.9–10.3)
Chloride: 104 mmol/L (ref 98–111)
Creatinine, Ser: 0.85 mg/dL (ref 0.61–1.24)
GFR, Estimated: >60 mL/min
Glucose, Bld: 109 mg/dL — ABNORMAL HIGH (ref 70–99)
Phosphorus: 2.8 mg/dL (ref 2.5–4.6)
Potassium: 4 mmol/L (ref 3.5–5.1)
Sodium: 139 mmol/L (ref 135–145)

## 2024-05-14 LAB — MAGNESIUM: Magnesium: 1.6 mg/dL — ABNORMAL LOW (ref 1.7–2.4)

## 2024-05-14 LAB — PHOSPHORUS: Phosphorus: 2.7 mg/dL (ref 2.5–4.6)

## 2024-05-14 MED ORDER — MAGNESIUM SULFATE 2 GM/50ML IV SOLN
2.0000 g | Freq: Once | INTRAVENOUS | Status: AC
  Administered 2024-05-14: 2 g via INTRAVENOUS
  Filled 2024-05-14: qty 50

## 2024-05-14 MED ORDER — AMLODIPINE BESYLATE 10 MG PO TABS
10.0000 mg | ORAL_TABLET | Freq: Every day | ORAL | Status: DC
  Administered 2024-05-14 – 2024-05-23 (×10): 10 mg
  Filled 2024-05-14 (×9): qty 1

## 2024-05-14 NOTE — Progress Notes (Addendum)
 S: Patient remains NPO 2/2 severe aspiration risk  O:   Exam  Vitals:   05/14/24 0000 05/14/24 0400  BP: 132/72 134/63  Pulse: 79 75  Resp:    Temp: 98.2 F (36.8 C) 98.7 F (37.1 C)  SpO2: 97% 94%    Gen: patient lying in bed, NAD CV: extremities appear well-perfused Resp: normal WOB  Neurologic exam MS: alert, unable to answer orientation questions 2/2 unintelligible speech, follows commands Speech: severe dysarthria, speech is unintelligible CN: PERRL, VFF, EOMI, sensation intact, R lower facial droop, hearing intact to voice Motor: 5/5 strength throughout Sensory: SILT Reflexes: 2+ symm with toes down bilat Coordination: FNF intact bilat Gait: deferred  NIHSS = 5  MRI brain 1. Acute nonhemorrhagic 10mm infarct in the lateral aspect of the thalamus and posterior limb of the left internal capsule. 2. Two acute punctate infarcts in the posterior limb of the right internal capsule. 3. Moderate atrophy and confluent periventricular white matter changes extending to the subcortical regions bilaterally. This most likely reflects the sequelae of chronic microvascular ischemia. 4. Remote lacunar infarcts in the inferior cerebellum bilaterally and remote ischemic changes extending into the brainstem.  CT HEAD:   1. Stable appearance of small acute ischemic infarcts involving the bilateral basal ganglia. No associated hemorrhage or mass effect. 2. Underlying moderate chronic microvascular ischemic disease with a few scattered remote lacunar infarcts about the deep gray nuclei, pons, and cerebellum.   CTA HEAD AND NECK:   1. Negative CTA for acute large vessel occlusion or other emergent finding. 2. 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 3. Moderate left A2 stenosis. 4. Severe multifocal left P2 and distal right P3 stenoses. 5. Focal moderate to severe stenosis involving  the proximal left V1 segment. 6. Focal severe stenosis involving the distal left V4 segment just prior to the vertebrobasilar junction.  TTE 1. Left ventricular ejection fraction, by estimation, is 60 to 65% . The left ventricle has normal function. The left ventricle has no regional wall motion abnormalities. There is mild left ventricular hypertrophy. Left ventricular diastolic parameters are consistent with Grade I diastolic dysfunction ( impaired relaxation) . 2. Right ventricular systolic function is normal. The right ventricular size is normal. 3. Left atrial size was mildly dilated. 4. The mitral valve is normal in structure. Trivial mitral valve regurgitation. 5. The aortic valve is normal in structure. Aortic valve regurgitation is trivial. Aortic valve sclerosis is present, with no evidence of aortic valve stenosis. 6. Agitated saline contrast bubble study was negative, with no evidence of any interatrial shunt.  Stroke Labs     Component Value Date/Time   CHOL 212 (H) 05/11/2024 1320   TRIG 87 05/11/2024 1320   HDL 41 05/11/2024 1320   CHOLHDL 5.2 05/11/2024 1320   VLDL 17 05/11/2024 1320   LDLCALC 154 (H) 05/11/2024 1320    Lab Results  Component Value Date/Time   HGBA1C 8.1 (H) 05/11/2024 01:20 PM   A/P: 63 yo man admitted for stroke workup. Etiology of stroke is small vessel disease.  - Goal normotension, avoid hypotension - ASA 81mg  daily + plavix  75mg  daily x90 days f/b ASA 81mg  daily monotherapy after that - Atorvastatin  80mg  daily - NGT ordered - q4 hr neuro checks - STAT head CT for any change in neuro exam - Tele - PT/OT/SLP - Stroke education - Amb referral to neurology upon discharge   Neurology will be available prn for questions  going forward.  Elida Ross, MD [facility] [phone]  If 7pm- 7am, please page neurology on call as listed in AMION.

## 2024-05-14 NOTE — Plan of Care (Signed)
" °  Problem: Education: Goal: Knowledge of patient specific risk factors will improve (DELETE if not current risk factor) Outcome: Progressing   Problem: Coping: Goal: Will identify appropriate support needs Outcome: Progressing   Problem: Self-Care: Goal: Ability to communicate needs accurately will improve Outcome: Progressing   Problem: Coping: Goal: Ability to adjust to condition or change in health will improve Outcome: Progressing   Problem: Fluid Volume: Goal: Ability to maintain a balanced intake and output will improve Outcome: Progressing   "

## 2024-05-14 NOTE — Progress Notes (Signed)
 " PROGRESS NOTE  Juan Harper FMW:[id number] DOB: 1960-11-18   PCP: Sharl Delon NOVAK, NP  Patient is from: Home.  Has a roommate.  DOA: 05/11/2024 LOS: 2  Chief complaints Chief Complaint  Patient presents with   Weakness     Brief Narrative / Interim history: 63 year old M with PMH of DM-2, HTN, HLD and cocaine use brought to ED due to slurred speech and difficulty speaking.  Patient was symptomatic for over 24 hours before arrival to ED  In ED, stable vitals.  Labs without significant finding.  EtOH level negative.  CT head showed acute/subacute stroke in left internal capsule with remote bilateral thalamic strokes.  Neurology consulted.    MRI brain showed acute nonhemorrhagic 10 mm infarct in the lateral aspect of thalamus and posterior limb of left IC, two acute punctate infarct in posterior limb of right IC and remote bilateral cerebral and brainstem CVA.  CT angio head and neck showed moderate to severe bilateral ICA stenosis of the cavernous segments, moderate left A2 stenosis, severe multifocal left P2 and distal right P3 stenosis.,  Focal moderate to severe stenosis involving the proximal left P1 segment and focal severe stenosis involving the distal left V4 segment.  TTE without significant finding  Still with dense dysarthria and severe dysphagia.  Plan for NG tube placement and tube feeding  Subjective: Seen and examined earlier this morning.  No major events overnight of this morning.  Feels hungry.  Dysarthria seems to have improved.    Assessment and plan: Acute lacunar infarction: Presents with slurred speech.  Has right facial droop and relative weakness in left leg compared to right.  Imaging including CT head, MRI brain and CT angio head and neck as above.  TTE without significant finding.  LDL 154.  A1c 8.1%. -Neurology recommends aspirin  and Plavix  for 3 months followed by aspirin  monotherapy -Lipitor  80 mg daily -SLP recommended strict n.p.o..  Plan for NG  tube insertion and tube feed -PT/OT recommended SNF.  NIDDM-2 with hyperglycemia: A1c 8.1%. Recent Labs  Lab 05/13/24 2119 05/13/24 2357 05/14/24 0409 05/14/24 0803 05/14/24 1142  GLUCAP 119* 108* 109* 108* 120*  - Continue SSI-sensitive - Hold home p.o. meds.  Essential hypertension: BP elevated.  On lisinopril  and Hygroton at home - Start amlodipine  10 mg daily -Continue holding lisinopril  and Hygroton   History of polysubstance use: Prior UDS positive for cocaine marijuana - Follow UDS.   BPH: Does not seem to be on meds.  Concern about his living condition: Family concerned about living situation.  Patient lives with roommate. - TOC consulted to explore  further.  Hypomagnesemia - Monitor replenish as appropriate  Severe malnutrition Body mass index is 26.17 kg/m. Nutrition Problem: Severe Malnutrition Etiology: social / environmental circumstances Signs/Symptoms: moderate fat depletion, severe fat depletion, moderate muscle depletion, severe muscle depletion Interventions: Tube feeding   DVT prophylaxis:  heparin  injection 5,000 Units Start: 05/11/24 1600  Code Status: Full code Family Communication: None at bedside Level of care: Telemetry Status is: Inpatient The patient will remain inpatient because: Acute CVA with dysarthria left leg weakness   Final disposition: SNF   55 minutes with more than 50% spent in reviewing records, counseling patient/family and coordinating care.  Consultants:  Neurology  Procedures: None  Microbiology summarized: None  Objective: Vitals:   05/14/24 0000 05/14/24 0400 05/14/24 0800 05/14/24 1137  BP: 132/72 134/63 (!) 147/76 (!) 160/86  Pulse: 79 75 79 74  Resp:    18  Temp:  98.2 F (36.8 C) 98.7 F (37.1 C) 98.5 F (36.9 C) 98.3 F (36.8 C)  TempSrc: Oral Oral    SpO2: 97% 94% 99% 94%  Weight:      Height:        Examination:  GENERAL: No apparent distress.  Nontoxic. HEENT: MMM.  Vision and  hearing grossly intact.  NECK: Supple.  No apparent JVD.  RESP:  No IWOB.  Fair aeration bilaterally. CVS:  RRR. Heart sounds normal.  ABD/GI/GU: BS+. Abd soft, NTND.  MSK/EXT:  Moves extremities. No apparent deformity. No edema.  SKIN: no apparent skin lesion or wound NEURO: Awake and alert.  Oriented appropriately.  Dysarthria but improved.  Symmetric strength in all extremities patellar reflex symmetric.  No pronator drift.  Patient was not cooperative to assess facial droop PSYCH: Calm. Normal affect.   Sch Meds:  Scheduled Meds:  amLODipine   10 mg Per Tube Daily   aspirin   81 mg Per Tube Daily   atorvastatin   80 mg Per Tube Daily   clopidogrel   75 mg Per Tube Daily   feeding supplement (PROSource TF20)  60 mL Per Tube BID   free water   30 mL Per Tube Q4H   heparin   5,000 Units Subcutaneous Q8H   insulin  aspart  0-5 Units Subcutaneous QHS   insulin  aspart  0-9 Units Subcutaneous TID WC   multivitamin with minerals  1 tablet Per Tube Daily   sodium chloride  flush  3 mL Intravenous Q12H   thiamine   100 mg Per Tube Daily   Continuous Infusions:  dextrose  5 % and 0.9 % NaCl 75 mL/hr at 05/14/24 1116   feeding supplement (OSMOLITE 1.5 CAL) 60 mL/hr at 05/14/24 1116   PRN Meds:.acetaminophen  **OR** acetaminophen , ondansetron  **OR** ondansetron  (ZOFRAN ) IV, polyethylene glycol, senna-docusate  Antimicrobials: Anti-infectives (From admission, onward)    None        I have personally reviewed the following labs and images: CBC: Recent Labs  Lab 05/11/24 1320 05/12/24 0547  WBC 6.9 5.4  NEUTROABS 4.7  --   HGB 14.2 13.0  HCT 42.4 39.5  MCV 85.7 86.1  PLT 283 265   BMP &GFR Recent Labs  Lab 05/11/24 1320 05/12/24 0547 05/13/24 1710 05/14/24 0504 05/14/24 0917  NA 139 140  --   --  139  K 4.4 4.0  --   --  4.0  CL 102 103  --   --  104  CO2 23 22  --   --  25  GLUCOSE 156* 76  --   --  109*  BUN 22 21  --   --  12  CREATININE 1.09 0.99  --   --  0.85   CALCIUM  9.4 9.3  --   --  9.1  MG 1.8  --  1.7 1.6*  --   PHOS  --   --  2.8 2.7 2.8   Estimated Creatinine Clearance: 92.3 mL/min (by C-G formula based on SCr of 0.85 mg/dL). Liver & Pancreas: Recent Labs  Lab 05/11/24 1320 05/14/24 0917  AST 31  --   ALT 20  --   ALKPHOS 100  --   BILITOT 0.6  --   PROT 7.6  --   ALBUMIN 3.8 3.6   No results for input(s): LIPASE, AMYLASE in the last 168 hours. No results for input(s): AMMONIA in the last 168 hours. Diabetic: Recent Labs    05/11/24 1320  HGBA1C 8.1*   Recent Labs  Lab 05/13/24 2119  05/13/24 2357 05/14/24 0409 05/14/24 0803 05/14/24 1142  GLUCAP 119* 108* 109* 108* 120*   Cardiac Enzymes: No results for input(s): CKTOTAL, CKMB, CKMBINDEX, TROPONINI in the last 168 hours. No results for input(s): PROBNP in the last 8760 hours. Coagulation Profile: Recent Labs  Lab 05/11/24 1320  INR 1.0   Thyroid Function Tests: No results for input(s): TSH, T4TOTAL, FREET4, T3FREE, THYROIDAB in the last 72 hours. Lipid Profile: Recent Labs    05/11/24 1320  CHOL 212*  HDL 41  LDLCALC 154*  TRIG 87  CHOLHDL 5.2   Anemia Panel: No results for input(s): VITAMINB12, FOLATE, FERRITIN, TIBC, IRON, RETICCTPCT in the last 72 hours. Urine analysis:    Component Value Date/Time   COLORURINE STRAW (A) 02/03/2023 1630   APPEARANCEUR CLEAR (A) 02/03/2023 1630   LABSPEC 1.022 02/03/2023 1630   PHURINE 6.0 02/03/2023 1630   GLUCOSEU >=500 (A) 02/03/2023 1630   HGBUR NEGATIVE 02/03/2023 1630   BILIRUBINUR NEGATIVE 02/03/2023 1630   KETONESUR NEGATIVE 02/03/2023 1630   PROTEINUR 30 (A) 02/03/2023 1630   NITRITE NEGATIVE 02/03/2023 1630   LEUKOCYTESUR NEGATIVE 02/03/2023 1630   Sepsis Labs: Invalid input(s): PROCALCITONIN, LACTICIDVEN  Microbiology: No results found for this or any previous visit (from the past 240 hours).  Radiology Studies: No results found.     Jailin Moomaw T.  Lindaann Gradilla [facility]  If 7PM-7AM, please contact night-coverage [email or web address] 05/14/2024, 12:18 PM   "

## 2024-05-14 NOTE — Progress Notes (Signed)
 Speech Language Pathology Treatment: Dysphagia  Patient Details Name: Juan Harper MRN: [id number] DOB: Aug 15, 1960 Today's Date: 05/14/2024 Time: 9169-9149 SLP Time Calculation (min) (ACUTE ONLY): 20 min  Assessment / Plan / Recommendation Clinical Impression  Pt seen for follow up dysphagia intervention. Stability noted with pt on room air, afebrile, WBC WNL, and no recent chest imaging. Though baseline wet cough noted, concerning for difficulty managing secretions. Pt requiring significant assist for re-aligning in bed and completion of oral care. Severe dysarthria persists, though able to make wants/needs known in context with aid of increased volume and gesture. Trials completed of thin liquids and ice chips- consistent wet vocal quality and cough noted. Increased time required for recovery. Oral suction set up and moved within pt's reach to facilitate use. Education shared with pt regarding rationale for recommendations and concern for aspiration given overt coughing. Will follow up with additional education. RN in room at end of session- recommendations shared for strict NPO. Meds via alternative means. Frequent oral care. MD and RN aware of recommendations for alternative feeding and monitoring for readiness for completion of instrumental assessment. SLP will continue to follow.    HPI HPI: Per H&P, Juan Harper is a 63 y.o. year old male with medical history of HTN, HLD, DMII,  presenting to the ED after having worsening slurred speech. MRI: 1. Acute nonhemorrhagic 10mm infarct in the lateral aspect of the thalamus and  posterior limb of the left internal capsule.  2. Two acute punctate infarcts in the posterior limb of the right internal  capsule.  3. Moderate atrophy and confluent periventricular white matter changes  extending to the subcortical regions bilaterally. This most likely reflects the  sequelae of chronic microvascular ischemia.  4. Remote lacunar infarcts in the inferior  cerebellum bilaterally and remote  ischemic changes extending into the brainstem.   Patient was symptomatic for over 24 hours before arrival to ED.  Family is concerned about his living situation.  Prior UDS positive for Cocaine, marijuana; current labs pending.      SLP Plan  Continue with current plan of care        Swallow Evaluation Recommendations   Recommendations: NPO Medication Administration: Via alternative means Oral care recommendations: Oral care QID (4x/day);Staff/trained caregiver to provide oral care Caregiver Recommendations: Have oral suction available     Recommendations                     Oral care QID;Staff/trained caregiver to provide oral care   Frequent or constant Supervision/Assistance Dysphagia, oropharyngeal phase (R13.12)     Continue with current plan of care    Syna Gad Clapp, MS, CCC-SLP Speech Language Pathologist Rehab Services; [facility] [phone] (ascom)   [name]  05/14/2024, 9:55 AM

## 2024-05-14 NOTE — Progress Notes (Signed)
"  ° °      Overnight   NAME: Juan Harper MRN: [id number] DOB : March 26, 1961    Date of Service   05/14/2024   HPI/Events of Note   HPI: 63 year old male with past medical history of diabetes type 2, hypertension, hyperlipoidemia, cocaine use brought to the ED due to slurred speech and difficulty speaking.  Patient was symptomatic for over 24 hours before arrival to the emergency department.  CT head showed acute subacute stroke in left internal capsule with remote bilateral ophthalmic strokes.  Neurology was consulted.  MRI brain showed acute nonhemorrhagic 10 mm infarct in the lateral aspect of the thalamus and posterior limb of the left ICA, 2 acute punctate infarcts in the posterior limb of right ICA and remote bilateral cerebral and brainstem CVA.    CTA angio head and neck showed moderate to severe bilateral ICA stenosis of the cavernous segments moderate left A2 stenosis severe multifocal left P2 and distal right P3 stenosis, focal moderate to severe stenosis involving the proximal left P1 segment and focal severe stenosis involving the distal left V4 segment.    TTE ED without significant finding.  Still with dense dysarthria and severe dysphagia.    NG tube placed today with KUB showing appropriate positioning and started tube feeding.   Overnight: RN reports found patient lying with head of bed less than 20 degrees.  New onset of rhonchi at bases.  RN paused tube feeds.  And increased head of bed to 30 degrees.  KUB and chest x-ray ordered  Interventions/ Plan   KUB-appropriate positioning of NG tube Cxray:      Laneta Gardener- Aram BSN RN CCRN AGACNP-BC Acute Care Nurse Practitioner [facility] [place]  "

## 2024-05-15 ENCOUNTER — Inpatient Hospital Stay

## 2024-05-15 DIAGNOSIS — N400 Enlarged prostate without lower urinary tract symptoms: Secondary | ICD-10-CM | POA: Diagnosis not present

## 2024-05-15 DIAGNOSIS — E1165 Type 2 diabetes mellitus with hyperglycemia: Secondary | ICD-10-CM | POA: Diagnosis not present

## 2024-05-15 DIAGNOSIS — I10 Essential (primary) hypertension: Secondary | ICD-10-CM | POA: Diagnosis not present

## 2024-05-15 DIAGNOSIS — E43 Unspecified severe protein-calorie malnutrition: Secondary | ICD-10-CM | POA: Diagnosis not present

## 2024-05-15 DIAGNOSIS — Z794 Long term (current) use of insulin: Secondary | ICD-10-CM | POA: Diagnosis not present

## 2024-05-15 DIAGNOSIS — I6381 Other cerebral infarction due to occlusion or stenosis of small artery: Secondary | ICD-10-CM | POA: Diagnosis not present

## 2024-05-15 DIAGNOSIS — I639 Cerebral infarction, unspecified: Secondary | ICD-10-CM | POA: Diagnosis not present

## 2024-05-15 DIAGNOSIS — I5032 Chronic diastolic (congestive) heart failure: Secondary | ICD-10-CM | POA: Diagnosis not present

## 2024-05-15 LAB — CBC
HCT: 38.6 % — ABNORMAL LOW (ref 39.0–52.0)
Hemoglobin: 13 g/dL (ref 13.0–17.0)
MCH: 28.6 pg (ref 26.0–34.0)
MCHC: 33.7 g/dL (ref 30.0–36.0)
MCV: 84.8 fL (ref 80.0–100.0)
Platelets: 274 K/uL (ref 150–400)
RBC: 4.55 MIL/uL (ref 4.22–5.81)
RDW: 12.6 % (ref 11.5–15.5)
WBC: 6.2 K/uL (ref 4.0–10.5)
nRBC: 0 % (ref 0.0–0.2)

## 2024-05-15 LAB — GLUCOSE, CAPILLARY
Glucose-Capillary: 122 mg/dL — ABNORMAL HIGH (ref 70–99)
Glucose-Capillary: 131 mg/dL — ABNORMAL HIGH (ref 70–99)
Glucose-Capillary: 134 mg/dL — ABNORMAL HIGH (ref 70–99)
Glucose-Capillary: 143 mg/dL — ABNORMAL HIGH (ref 70–99)
Glucose-Capillary: 143 mg/dL — ABNORMAL HIGH (ref 70–99)
Glucose-Capillary: 166 mg/dL — ABNORMAL HIGH (ref 70–99)

## 2024-05-15 LAB — RENAL FUNCTION PANEL
Albumin: 3.5 g/dL (ref 3.5–5.0)
Anion gap: 11 (ref 5–15)
BUN: 12 mg/dL (ref 8–23)
CO2: 24 mmol/L (ref 22–32)
Calcium: 9.2 mg/dL (ref 8.9–10.3)
Chloride: 104 mmol/L (ref 98–111)
Creatinine, Ser: 0.81 mg/dL (ref 0.61–1.24)
GFR, Estimated: >60 mL/min
Glucose, Bld: 123 mg/dL — ABNORMAL HIGH (ref 70–99)
Phosphorus: 2.5 mg/dL (ref 2.5–4.6)
Potassium: 4 mmol/L (ref 3.5–5.1)
Sodium: 139 mmol/L (ref 135–145)

## 2024-05-15 LAB — MAGNESIUM: Magnesium: 1.7 mg/dL (ref 1.7–2.4)

## 2024-05-15 MED ORDER — HYDRALAZINE HCL 50 MG PO TABS: 25.0000 mg | ORAL_TABLET | Freq: Four times a day (QID) | ORAL | Status: DC | PRN

## 2024-05-15 MED ORDER — ONDANSETRON HCL 4 MG PO TABS: 4.0000 mg | ORAL_TABLET | Freq: Four times a day (QID) | ORAL | Status: DC | PRN

## 2024-05-15 MED ORDER — ONDANSETRON HCL 4 MG/2ML IJ SOLN: 4.0000 mg | Freq: Four times a day (QID) | INTRAMUSCULAR | Status: DC | PRN

## 2024-05-15 MED ORDER — ACETAMINOPHEN 650 MG RE SUPP: 650.0000 mg | Freq: Four times a day (QID) | RECTAL | Status: DC | PRN

## 2024-05-15 MED ORDER — LISINOPRIL 20 MG PO TABS
40.0000 mg | ORAL_TABLET | Freq: Every day | ORAL | Status: DC
  Administered 2024-05-15 – 2024-05-23 (×9): 40 mg
  Filled 2024-05-15 (×8): qty 2

## 2024-05-15 MED ORDER — DEXTROSE 5 % IV SOLN: INTRAVENOUS | Status: AC

## 2024-05-15 MED ORDER — ACETAMINOPHEN 325 MG PO TABS: 650.0000 mg | ORAL_TABLET | Freq: Four times a day (QID) | ORAL | Status: DC | PRN

## 2024-05-15 NOTE — Progress Notes (Signed)
 PT Cancellation Note  Patient Details Name: Juan Harper MRN: [id number] DOB: 09-30-1960   Cancelled Treatment:    Reason Eval/Treat Not Completed: Fatigue/lethargy limiting ability to participate  Pt in bed and appears asleep.  Soft groan when initiated awakening and pt pulled covers up and rolled over.  Encouraged participation but he groans again and tightens blankets.  Will return at later time/date.   Lauraine Gills 05/15/2024, 2:11 PM

## 2024-05-15 NOTE — Progress Notes (Signed)
 I have asked patient if I can replace the NGT and he said No.

## 2024-05-15 NOTE — Plan of Care (Signed)
" °  Problem: Education: Goal: Knowledge of patient specific risk factors will improve (DELETE if not current risk factor) Outcome: Progressing   Problem: Health Behavior/Discharge Planning: Goal: Goals will be collaboratively established with patient/family Outcome: Progressing   Problem: Nutrition: Goal: Dietary intake will improve Outcome: Progressing   Problem: Health Behavior/Discharge Planning: Goal: Ability to identify and utilize available resources and services will improve Outcome: Progressing   "

## 2024-05-15 NOTE — Progress Notes (Signed)
 Pt removed NGT completely and placed it on bedside table. Pt made it clear he does want an NGT at this time and is refusing attempts to replace tube. [name], DO made aware.

## 2024-05-15 NOTE — Plan of Care (Signed)
" °  Problem: Education: Goal: Knowledge of disease or condition will improve Outcome: Progressing Goal: Knowledge of patient specific risk factors will improve (DELETE if not current risk factor) 05/15/2024 0544 by Roark Selinda CROME, RN Outcome: Progressing 05/15/2024 0530 by Roark Selinda CROME, RN Outcome: Progressing   Problem: Ischemic Stroke/TIA Tissue Perfusion: Goal: Complications of ischemic stroke/TIA will be minimized Outcome: Progressing   Problem: Health Behavior/Discharge Planning: Goal: Goals will be collaboratively established with patient/family Outcome: Progressing   Problem: Health Behavior/Discharge Planning: Goal: Ability to identify and utilize available resources and services will improve Outcome: Progressing   Problem: Tissue Perfusion: Goal: Adequacy of tissue perfusion will improve Outcome: Progressing   Problem: Clinical Measurements: Goal: Ability to maintain clinical measurements within normal limits will improve Outcome: Progressing   Problem: Nutrition: Goal: Dietary intake will improve 05/15/2024 0544 by Roark Selinda CROME, RN Outcome: Not Progressing 05/15/2024 0530 by Roark Selinda CROME, RN Outcome: Progressing   "

## 2024-05-15 NOTE — Care Management Important Message (Signed)
 Important Message  Patient Details  Name: Juan Harper MRN: [id number] Date of Birth: August 03, 1960   Important Message Given:  Yes - Medicare IM     [name] 05/15/2024, 1:18 PM

## 2024-05-15 NOTE — Progress Notes (Signed)
 " PROGRESS NOTE  Juan Harper FMW:[id number] DOB: 12-15-1960   PCP: Sharl Delon NOVAK, NP  Patient is from: Home.  Has a roommate.  DOA: 05/11/2024 LOS: 3  Chief complaints Chief Complaint  Patient presents with   Weakness     Brief Narrative / Interim history: 63 year old M with PMH of DM-2, HTN, HLD and cocaine use brought to ED due to slurred speech and difficulty speaking.  Patient was symptomatic for over 24 hours before arrival to ED  In ED, stable vitals.  Labs without significant finding.  EtOH level negative.  CT head showed acute/subacute stroke in left internal capsule with remote bilateral thalamic strokes.  Neurology consulted.    MRI brain showed acute nonhemorrhagic 10 mm infarct in the lateral aspect of thalamus and posterior limb of left IC, two acute punctate infarct in posterior limb of right IC and remote bilateral cerebral and brainstem CVA.  CT angio head and neck showed moderate to severe bilateral ICA stenosis of the cavernous segments, moderate left A2 stenosis, severe multifocal left P2 and distal right P3 stenosis.,  Focal moderate to severe stenosis involving the proximal left P1 segment and focal severe stenosis involving the distal left V4 segment.  TTE without significant finding  Still with dense dysarthria and severe dysphagia.  NG tube placed and started on TF.  Plan for MBS on 12/22.  Therapy recommended SNF.  Subjective: Seen and examined earlier this morning.  Patient pulled out NG tube overnight.  No complaint this morning.  Agrees to NG tube replacement.    Assessment and plan: Acute lacunar infarction: Presents with slurred speech.  Has right facial droop and relative weakness in left leg compared to right.  Imaging including CT head, MRI brain and CT angio head and neck as above.  TTE without significant finding.  LDL 154.  A1c 8.1%. -Neurology recommends aspirin  and Plavix  for 3 months followed by aspirin  monotherapy -Continue Lipitor  80 mg  daily - Strict n.p.o. per SLP.  MBS on 12/22. -Replace NGT for TF and meds. -PT/OT recommended SNF.  NIDDM-2 with hyperglycemia: A1c 8.1%. Recent Labs  Lab 05/14/24 1928 05/14/24 2114 05/14/24 2326 05/15/24 0359 05/15/24 0758  GLUCAP 125* 121* 122* 134* 131*  - Continue SSI-sensitive - Hold home p.o. meds.  Essential hypertension: BP elevated.  On lisinopril  and Hygroton at home -Continue amlodipine  10 mg daily -Resume home lisinopril  40 mg daily -Continue holding Hygroton -Hydralazine  as needed   History of polysubstance use: Prior UDS positive for cocaine marijuana - Follow UDS-pending  BPH: Does not seem to be on meds.  Concern about his living condition: Family concerned about living situation.  Patient lives with roommate. - TOC consulted to explore  further.  Hypomagnesemia - Monitor replenish as appropriate  Severe malnutrition Body mass index is 25.86 kg/m. Nutrition Problem: Severe Malnutrition Etiology: social / environmental circumstances Signs/Symptoms: moderate fat depletion, severe fat depletion, moderate muscle depletion, severe muscle depletion Interventions: Tube feeding   DVT prophylaxis:  heparin  injection 5,000 Units Start: 05/11/24 1600  Code Status: Full code Family Communication: None at bedside Level of care: Telemetry Status is: Inpatient The patient will remain inpatient because: Acute CVA with dysarthria left leg weakness   Final disposition: SNF   55 minutes with more than 50% spent in reviewing records, counseling patient/family and coordinating care.  Consultants:  Neurology  Procedures: None  Microbiology summarized: None  Objective: Vitals:   05/14/24 1937 05/14/24 2337 05/15/24 0337 05/15/24 0737  BP: (!) 162/93 ROLLEN)  164/87 (!) 157/79 (!) 164/86  Pulse: 81 86 83 85  Resp:    18  Temp: 98.2 F (36.8 C) 98.9 F (37.2 C) 98.5 F (36.9 C) 98.4 F (36.9 C)  TempSrc: Oral Oral Oral Oral  SpO2: 97% 94% 95% 96%   Weight:   84.1 kg   Height:        Examination:  GENERAL: No apparent distress.  Nontoxic. HEENT: MMM.  Vision and hearing grossly intact.  NECK: Supple.  No apparent JVD.  RESP:  No IWOB.  Fair aeration bilaterally. CVS:  RRR. Heart sounds normal.  ABD/GI/GU: BS+. Abd soft, NTND.  MSK/EXT:  Moves extremities. No apparent deformity. No edema.  SKIN: no apparent skin lesion or wound NEURO: Awake and alert.  Oriented appropriately.  Dysarthria.  Symmetric strength in all extremities patellar reflex symmetric.  No pronator drift.  Patient was not cooperative to assess facial droop PSYCH: Calm. Normal affect.   Sch Meds:  Scheduled Meds:  amLODipine   10 mg Per Tube Daily   aspirin   81 mg Per Tube Daily   atorvastatin   80 mg Per Tube Daily   clopidogrel   75 mg Per Tube Daily   feeding supplement (PROSource TF20)  60 mL Per Tube BID   free water   30 mL Per Tube Q4H   heparin   5,000 Units Subcutaneous Q8H   insulin  aspart  0-5 Units Subcutaneous QHS   insulin  aspart  0-9 Units Subcutaneous TID WC   multivitamin with minerals  1 tablet Per Tube Daily   sodium chloride  flush  3 mL Intravenous Q12H   thiamine   100 mg Per Tube Daily   Continuous Infusions:  feeding supplement (OSMOLITE 1.5 CAL) 1,000 mL (05/15/24 1031)   PRN Meds:.acetaminophen  **OR** acetaminophen , ondansetron  **OR** ondansetron  (ZOFRAN ) IV, polyethylene glycol, senna-docusate  Antimicrobials: Anti-infectives (From admission, onward)    None        I have personally reviewed the following labs and images: CBC: Recent Labs  Lab 05/11/24 1320 05/12/24 0547 05/15/24 0600  WBC 6.9 5.4 6.2  NEUTROABS 4.7  --   --   HGB 14.2 13.0 13.0  HCT 42.4 39.5 38.6*  MCV 85.7 86.1 84.8  PLT 283 265 274   BMP &GFR Recent Labs  Lab 05/11/24 1320 05/12/24 0547 05/13/24 1710 05/14/24 0504 05/14/24 0917 05/15/24 0600  NA 139 140  --   --  139 139  K 4.4 4.0  --   --  4.0 4.0  CL 102 103  --   --  104 104   CO2 23 22  --   --  25 24  GLUCOSE 156* 76  --   --  109* 123*  BUN 22 21  --   --  12 12  CREATININE 1.09 0.99  --   --  0.85 0.81  CALCIUM  9.4 9.3  --   --  9.1 9.2  MG 1.8  --  1.7 1.6*  --  1.7  PHOS  --   --  2.8 2.7 2.8 2.5   Estimated Creatinine Clearance: 96.8 mL/min (by C-G formula based on SCr of 0.81 mg/dL). Liver & Pancreas: Recent Labs  Lab 05/11/24 1320 05/14/24 0917 05/15/24 0600  AST 31  --   --   ALT 20  --   --   ALKPHOS 100  --   --   BILITOT 0.6  --   --   PROT 7.6  --   --   ALBUMIN 3.8 3.6  3.5   No results for input(s): LIPASE, AMYLASE in the last 168 hours. No results for input(s): AMMONIA in the last 168 hours. Diabetic: No results for input(s): HGBA1C in the last 72 hours.  Recent Labs  Lab 05/14/24 1928 05/14/24 2114 05/14/24 2326 05/15/24 0359 05/15/24 0758  GLUCAP 125* 121* 122* 134* 131*   Cardiac Enzymes: No results for input(s): CKTOTAL, CKMB, CKMBINDEX, TROPONINI in the last 168 hours. No results for input(s): PROBNP in the last 8760 hours. Coagulation Profile: Recent Labs  Lab 05/11/24 1320  INR 1.0   Thyroid Function Tests: No results for input(s): TSH, T4TOTAL, FREET4, T3FREE, THYROIDAB in the last 72 hours. Lipid Profile: No results for input(s): CHOL, HDL, LDLCALC, TRIG, CHOLHDL, LDLDIRECT in the last 72 hours.  Anemia Panel: No results for input(s): VITAMINB12, FOLATE, FERRITIN, TIBC, IRON, RETICCTPCT in the last 72 hours. Urine analysis:    Component Value Date/Time   COLORURINE STRAW (A) 02/03/2023 1630   APPEARANCEUR CLEAR (A) 02/03/2023 1630   LABSPEC 1.022 02/03/2023 1630   PHURINE 6.0 02/03/2023 1630   GLUCOSEU >=500 (A) 02/03/2023 1630   HGBUR NEGATIVE 02/03/2023 1630   BILIRUBINUR NEGATIVE 02/03/2023 1630   KETONESUR NEGATIVE 02/03/2023 1630   PROTEINUR 30 (A) 02/03/2023 1630   NITRITE NEGATIVE 02/03/2023 1630   LEUKOCYTESUR NEGATIVE 02/03/2023 1630    Sepsis Labs: Invalid input(s): PROCALCITONIN, LACTICIDVEN  Microbiology: No results found for this or any previous visit (from the past 240 hours).  Radiology Studies: DG Abd 1 View Result Date: 05/15/2024 EXAM: 1 VIEW XRAY OF THE ABDOMEN 05/15/2024 09:58:25 AM COMPARISON: 05/14/2024 CLINICAL HISTORY: Encounter for feeding tube placement [id number] FINDINGS: LINES, TUBES AND DEVICES: Enteric tube in place with tip at the distal stomach. BOWEL: Nonobstructive bowel gas pattern. SOFT TISSUES: No abnormal calcifications. BONES: No acute fracture. IMPRESSION: 1. Enteric tube in place with tip at the distal stomach. Electronically signed by: Waddell Calk MD 05/15/2024 10:14 AM EST RP Workstation: [id number]   DG Chest 1 View Result Date: 05/14/2024 EXAM: 1 VIEW(S) XRAY OF THE CHEST 05/14/2024 11:05:03 PM COMPARISON: None available. CLINICAL HISTORY: Abnormal lung sounds FINDINGS: LINES, TUBES AND DEVICES: Enteric tube in place with tip and side port below the hemidiaphragm. LUNGS AND PLEURA: Density is noted over the left base corresponding to the inferior aspect of 6th rib on the left. No pleural effusion. No pneumothorax. HEART AND MEDIASTINUM: Atherosclerotic plaque noted. No acute abnormality of the cardiac and mediastinal silhouettes. BONES AND SOFT TISSUES: No acute osseous abnormality. IMPRESSION: 1. No acute cardiopulmonary abnormality. 2. Enteric tube tip and side port project below the hemidiaphragm. Electronically signed by: Oneil Devonshire MD 05/14/2024 11:21 PM EST RP Workstation: [email or web address] [id number] View Result Date: 05/14/2024 EXAM: 1 VIEW XRAY OF THE ABDOMEN 05/14/2024 10:10:00 PM COMPARISON: 05/14/2024 CLINICAL HISTORY: Encounter for imaging study to confirm nasogastric (NG) tube placement. FINDINGS: LINES, TUBES AND DEVICES: Enteric tube in place with tip in the distal stomach. BOWEL: Nonobstructive bowel gas pattern. SOFT TISSUES: No abnormal calcifications. BONES: No acute  fracture. IMPRESSION: 1. Enteric tube in place with tip in the distal stomach. Electronically signed by: Oneil Devonshire MD 05/14/2024 10:12 PM EST RP Workstation: [email or web address] Simmer T. Doloris Servantes [facility]  If 7PM-7AM, please contact night-coverage [email or web address] 05/15/2024, 11:58 AM   "

## 2024-05-15 NOTE — Plan of Care (Signed)
" °  Problem: Education: Goal: Knowledge of patient specific risk factors will improve (DELETE if not current risk factor) Outcome: Not Progressing   "

## 2024-05-15 NOTE — Progress Notes (Signed)
 Speech Language Pathology Treatment: Dysphagia  Patient Details Name: Juan Harper MRN: [id number] DOB: 1961/02/28 Today's Date: 05/15/2024 Time: 0850-0905 SLP Time Calculation (min) (ACUTE ONLY): 15 min  Assessment / Plan / Recommendation Clinical Impression  Noted pt pulled NGT and is declining replacement. Pt seen for follow up dysphagia intervention. Stability noted with pt on room air, afebrile, WBC WNL, and no recent chest imaging. Pt requiring significant assist for re-aligning in bed and completion of oral care. Severe dysarthria persists, though able to make wants/needs known in context with aid of increased volume and gesture. Trials completed of thin liquids and ice chips- consistent wet vocal quality and cough noted. No overt s/sx with 1/2 teaspoons of puree. Oral deficits apparent with all textures with prolonged oral prep and repetitive lingual movement. A safe oral diet cannot be recommended at this time. Given pt's clinical presentation in setting of stroke, will proceed with MBSS next date.    HPI HPI: Per H&P, Juan Harper is a 63 y.o. year old male with medical history of HTN, HLD, DMII,  presenting to the ED after having worsening slurred speech. MRI: 1. Acute nonhemorrhagic 10mm infarct in the lateral aspect of the thalamus and  posterior limb of the left internal capsule.  2. Two acute punctate infarcts in the posterior limb of the right internal  capsule.  3. Moderate atrophy and confluent periventricular white matter changes  extending to the subcortical regions bilaterally. This most likely reflects the  sequelae of chronic microvascular ischemia.  4. Remote lacunar infarcts in the inferior cerebellum bilaterally and remote  ischemic changes extending into the brainstem.   Patient was symptomatic for over 24 hours before arrival to ED.  Family is concerned about his living situation.  Prior UDS positive for Cocaine, marijuana; current labs pending.      SLP Plan  Continue  with current plan of care;MBS;New goals to be determined pending instrumental study (MBSS tentatively scheduled for 12/22 @ 0800)        Swallow Evaluation Recommendations         Recommendations  Diet recommendations: NPO                  Oral care QID;Staff/trained caregiver to provide oral care     Dysphagia, oropharyngeal phase (R13.12)     Continue with current plan of care;MBS;New goals to be determined pending instrumental study (MBSS tentatively scheduled for 12/22 @ 0800)    Delon Bangs, M.S., CCC-SLP Speech-Language Pathologist [facility] [phone] [place])  [name]  05/15/2024, 9:10 AM

## 2024-05-15 NOTE — Plan of Care (Signed)
   Problem: Clinical Measurements: Goal: Respiratory complications will improve Outcome: Progressing

## 2024-05-16 ENCOUNTER — Inpatient Hospital Stay

## 2024-05-16 DIAGNOSIS — E43 Unspecified severe protein-calorie malnutrition: Secondary | ICD-10-CM

## 2024-05-16 DIAGNOSIS — N400 Enlarged prostate without lower urinary tract symptoms: Secondary | ICD-10-CM | POA: Diagnosis not present

## 2024-05-16 DIAGNOSIS — I5032 Chronic diastolic (congestive) heart failure: Secondary | ICD-10-CM | POA: Diagnosis not present

## 2024-05-16 DIAGNOSIS — E1165 Type 2 diabetes mellitus with hyperglycemia: Secondary | ICD-10-CM | POA: Diagnosis not present

## 2024-05-16 DIAGNOSIS — I639 Cerebral infarction, unspecified: Secondary | ICD-10-CM | POA: Diagnosis not present

## 2024-05-16 LAB — CBC
HCT: 39.9 % (ref 39.0–52.0)
Hemoglobin: 13.6 g/dL (ref 13.0–17.0)
MCH: 28.8 pg (ref 26.0–34.0)
MCHC: 34.1 g/dL (ref 30.0–36.0)
MCV: 84.4 fL (ref 80.0–100.0)
Platelets: 297 K/uL (ref 150–400)
RBC: 4.73 MIL/uL (ref 4.22–5.81)
RDW: 12.7 % (ref 11.5–15.5)
WBC: 5.7 K/uL (ref 4.0–10.5)
nRBC: 0 % (ref 0.0–0.2)

## 2024-05-16 LAB — RENAL FUNCTION PANEL
Albumin: 3.3 g/dL — ABNORMAL LOW (ref 3.5–5.0)
Anion gap: 10 (ref 5–15)
BUN: 16 mg/dL (ref 8–23)
CO2: 25 mmol/L (ref 22–32)
Calcium: 9.2 mg/dL (ref 8.9–10.3)
Chloride: 103 mmol/L (ref 98–111)
Creatinine, Ser: 0.8 mg/dL (ref 0.61–1.24)
GFR, Estimated: >60 mL/min
Glucose, Bld: 180 mg/dL — ABNORMAL HIGH (ref 70–99)
Phosphorus: 3.4 mg/dL (ref 2.5–4.6)
Potassium: 4.1 mmol/L (ref 3.5–5.1)
Sodium: 138 mmol/L (ref 135–145)

## 2024-05-16 LAB — MAGNESIUM: Magnesium: 1.7 mg/dL (ref 1.7–2.4)

## 2024-05-16 LAB — GLUCOSE, CAPILLARY
Glucose-Capillary: 153 mg/dL — ABNORMAL HIGH (ref 70–99)
Glucose-Capillary: 164 mg/dL — ABNORMAL HIGH (ref 70–99)
Glucose-Capillary: 136 mg/dL — ABNORMAL HIGH (ref 70–99)
Glucose-Capillary: 176 mg/dL — ABNORMAL HIGH (ref 70–99)
Glucose-Capillary: 162 mg/dL — ABNORMAL HIGH (ref 70–99)

## 2024-05-16 MED ORDER — MAGNESIUM SULFATE IN D5W 1-5 GM/100ML-% IV SOLN
1.0000 g | Freq: Once | INTRAVENOUS | Status: AC
  Administered 2024-05-16: 1 g via INTRAVENOUS
  Filled 2024-05-16: qty 100

## 2024-05-16 NOTE — Progress Notes (Signed)
 Physical Therapy Treatment Patient Details Name: Juan Harper MRN: [id number] DOB: Jul 29, 1960 Today's Date: 05/16/2024   History of Present Illness Pt is a 63 y.o. year old male presenting to the ED on 05/11/24 after having worsening slurred speech, weakness, confusion and falls. 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 PMH of HTN, HLD, DMII    PT Comments  Overlap session to assist OT with balance during ADLs and safely provide static standing at EOB. Increased assist needed this date for all functional mobility. Pt with poor standing balance at RW requiring ModA at times to prevent Right lateral LOB. Unable to safely progress to bedside chair. Ptalso experiencing continuous BM, nursing notified. Pt positioned back in bed with all needs in reach.    If plan is discharge home, recommend the following: A little help with walking and/or transfers;A little help with bathing/dressing/bathroom   Can travel by private vehicle     No  Equipment Recommendations  None recommended by PT (TBD at next level of care)    Recommendations for Other Services       Precautions / Restrictions Precautions Precautions: Fall Recall of Precautions/Restrictions: Impaired Precaution/Restrictions Comments:  (NGT for feeding) Restrictions Weight Bearing Restrictions Per Provider Order: No     Mobility  Bed Mobility Overal bed mobility: Needs Assistance Bed Mobility: Sit to Supine       Sit to supine: Mod assist, +2 for physical assistance, Used rails   General bed mobility comments:  (Increased assist to return to supine in bed)    Transfers Overall transfer level: Needs assistance Equipment used: Rolling walker (2 wheels) Transfers: Sit to/from Stand Sit to Stand: Mod assist, Min assist, +2 physical assistance, From elevated  surface           General transfer comment:  (Transfer assist varied due to lethargy and general weakness. Moderate Right lateral lean in standing requiring ModA for balance)    Ambulation/Gait               General Gait Details:  (Unable to tolerate safe gait progression this date)   Stairs             Wheelchair Mobility     Tilt Bed    Modified Rankin (Stroke Patients Only)       Balance Overall balance assessment: Needs assistance Sitting-balance support: Feet supported Sitting balance-Leahy Scale: Fair     Standing balance support: Bilateral upper extremity supported, Reliant on assistive device for balance, During functional activity Standing balance-Leahy Scale: Poor Standing balance comment:  (Right lateral lean requiring ModA for balance)                            Communication Communication Communication: Impaired Factors Affecting Communication: Difficulty expressing self;Reduced clarity of speech  Cognition Arousal: Lethargic Behavior During Therapy: Flat affect   PT - Cognitive impairments: Difficult to assess Difficult to assess due to: Impaired communication                     PT - Cognition Comments:  (lethargic, repeated cues to follow simple commands) Following commands: Impaired Following commands impaired: Follows one step commands inconsistently, Follows one step commands with increased time    Cueing Cueing Techniques: Verbal cues, Gestural cues, Tactile cues  Exercises Other Exercises Other Exercises:  (Pt required repeated vc's for transfer technique, balance in standing, and engagement)    General Comments General comments (skin integrity, edema, etc.):  (NGT intact, pt incontinent of bowel during session)      Pertinent Vitals/Pain      Home Living                          Prior Function            PT Goals (current goals can now be found in the care plan section) Acute Rehab  PT Goals Patient Stated Goal: none stated    Frequency    Min 2X/week      PT Plan      Co-evaluation              AM-PAC PT 6 Clicks Mobility   Outcome Measure  Help needed turning from your back to your side while in a flat bed without using bedrails?: A Lot Help needed moving from lying on your back to sitting on the side of a flat bed without using bedrails?: A Lot Help needed moving to and from a bed to a chair (including a wheelchair)?: A Lot Help needed standing up from a chair using your arms (e.g., wheelchair or bedside chair)?: A Lot Help needed to walk in hospital room?: A Lot Help needed climbing 3-5 steps with a railing? : A Lot 6 Click Score: 12    End of Session Equipment Utilized During Treatment: Gait belt Activity Tolerance: Patient tolerated treatment well Patient left: in bed;with call bell/phone within reach;with bed alarm set Nurse Communication: Mobility status PT Visit Diagnosis: Unsteadiness on feet (R26.81);Other abnormalities of gait and mobility (R26.89);Muscle weakness (generalized) (M62.81);Difficulty in walking, not elsewhere classified (R26.2)     Time: 8895-8883 PT Time Calculation (min) (ACUTE ONLY): 12 min  Charges:    $Therapeutic Activity: 8-22 mins PT General Charges $$ ACUTE PT VISIT: 1 Visit                    Darice Bohr, PTA  Darice JAYSON Bohr 05/16/2024, 12:32 PM

## 2024-05-16 NOTE — Progress Notes (Signed)
 " PROGRESS NOTE  Juan Harper FMW:[id number] DOB: 16-Oct-1960   PCP: Sharl Delon NOVAK, NP  Patient is from: Home.  Has a roommate.  DOA: 05/11/2024 LOS: 4  Chief complaints Chief Complaint  Patient presents with   Weakness     Brief Narrative / Interim history: 63 year old M with PMH of DM-2, HTN, HLD and cocaine use brought to ED due to slurred speech and difficulty speaking.  Patient was symptomatic for over 24 hours before arrival to ED  In ED, stable vitals.  Labs without significant finding.  EtOH level negative.  CT head showed acute/subacute stroke in left internal capsule with remote bilateral thalamic strokes.  Neurology consulted.    MRI brain showed acute nonhemorrhagic 10 mm infarct in the lateral aspect of thalamus and posterior limb of left IC, two acute punctate infarct in posterior limb of right IC and remote bilateral cerebral and brainstem CVA.  CT angio head and neck showed moderate to severe bilateral ICA stenosis of the cavernous segments, moderate left A2 stenosis, severe multifocal left P2 and distal right P3 stenosis.,  Focal moderate to severe stenosis involving the proximal left P1 segment and focal severe stenosis involving the distal left V4 segment.  TTE without significant finding  Still with dense dysarthria and severe dysphagia.  NG tube placed and started on TF.  SLP following.  Therapy recommended SNF.  Subjective: Seen and examined earlier this morning after he returned from MBS.  No complaints.  Still with right facial droop and dense dysarthria.  Assessment and plan: Acute lacunar infarction: Presents with slurred speech.  Has right facial droop and relative weakness in left leg compared to right.  Imaging including CT head, MRI brain and CT angio head and neck as above.  TTE without significant finding.  LDL 154.  A1c 8.1%. -Neurology recommends aspirin  and Plavix  for 3 months followed by aspirin  monotherapy -Continue Lipitor  80 mg daily -Remains  n.p.o. after MBS.  SLP following. -NGT replaced on 12/21.  Continue tube feed -PT/OT recommended SNF.  NIDDM-2 with hyperglycemia: A1c 8.1%. Recent Labs  Lab 05/15/24 1632 05/15/24 2128 05/15/24 2348 05/16/24 0341 05/16/24 0716  GLUCAP 143* 143* 166* 153* 164*  - Continue SSI-sensitive - Hold home p.o. meds.  Essential hypertension: BP elevated.  On lisinopril  and Hygroton at home -Continue amlodipine  10 mg daily -Resume home lisinopril  40 mg daily -Continue holding Hygroton -Hydralazine  as needed   History of polysubstance use: Prior UDS positive for cocaine marijuana - Follow UDS-pending  BPH: Does not seem to be on meds.  Concern about his living condition: Family concerned about living situation.  Patient lives with roommate. - TOC consulted to explore  further.  Hypomagnesemia - Monitor replenish as appropriate  Severe malnutrition Body mass index is 25.27 kg/m. Nutrition Problem: Severe Malnutrition Etiology: social / environmental circumstances Signs/Symptoms: moderate fat depletion, severe fat depletion, moderate muscle depletion, severe muscle depletion Interventions: Tube feeding   DVT prophylaxis:  heparin  injection 5,000 Units Start: 05/11/24 1600  Code Status: Full code Family Communication: None at bedside Level of care: Telemetry Status is: Inpatient The patient will remain inpatient because: Acute CVA and dysphagia   Final disposition: SNF   35 minutes with more than 50% spent in reviewing records, counseling patient/family and coordinating care.  Consultants:  Neurology  Procedures: None  Microbiology summarized: None  Objective: Vitals:   05/16/24 0200 05/16/24 0330 05/16/24 0400 05/16/24 0715  BP:  129/77  124/77  Pulse:  79  83  Resp: 17  19 18   Temp:  98.1 F (36.7 C)  97.7 F (36.5 C)  TempSrc:    Oral  SpO2:  94%  95%  Weight:  82.2 kg    Height:        Examination:  GENERAL: No apparent distress.   Nontoxic. HEENT: MMM.  Vision and hearing grossly intact.  NECK: Supple.  No apparent JVD.  RESP:  No IWOB.  Fair aeration bilaterally. CVS:  RRR. Heart sounds normal.  ABD/GI/GU: BS+. Abd soft, NTND.  MSK/EXT:  Moves extremities. No apparent deformity. No edema.  SKIN: no apparent skin lesion or wound NEURO: Awake and alert.  Oriented appropriately.  Dysarthria.  Right facial droop.  Seems to have symmetric strength in all extremities.  Patellar reflex symmetric.  No pronator drift.  Patient was not cooperative to assess facial droop PSYCH: Calm. Normal affect.   Sch Meds:  Scheduled Meds:  amLODipine   10 mg Per Tube Daily   aspirin   81 mg Per Tube Daily   atorvastatin   80 mg Per Tube Daily   clopidogrel   75 mg Per Tube Daily   feeding supplement (PROSource TF20)  60 mL Per Tube BID   free water   30 mL Per Tube Q4H   heparin   5,000 Units Subcutaneous Q8H   insulin  aspart  0-5 Units Subcutaneous QHS   insulin  aspart  0-9 Units Subcutaneous TID WC   lisinopril   40 mg Per Tube Daily   multivitamin with minerals  1 tablet Per Tube Daily   sodium chloride  flush  3 mL Intravenous Q12H   thiamine   100 mg Per Tube Daily   Continuous Infusions:  feeding supplement (OSMOLITE 1.5 CAL) 40 mL/hr at 05/16/24 0330   magnesium  sulfate bolus IVPB 1 g (05/16/24 0941)   PRN Meds:.acetaminophen  **OR** acetaminophen , hydrALAZINE , ondansetron  **OR** ondansetron  (ZOFRAN ) IV, polyethylene glycol, senna-docusate  Antimicrobials: Anti-infectives (From admission, onward)    None        I have personally reviewed the following labs and images: CBC: Recent Labs  Lab 05/11/24 1320 05/12/24 0547 05/15/24 0600 05/16/24 0651  WBC 6.9 5.4 6.2 5.7  NEUTROABS 4.7  --   --   --   HGB 14.2 13.0 13.0 13.6  HCT 42.4 39.5 38.6* 39.9  MCV 85.7 86.1 84.8 84.4  PLT 283 265 274 297   BMP &GFR Recent Labs  Lab 05/11/24 1320 05/12/24 0547 05/13/24 1710 05/14/24 0504 05/14/24 0917 05/15/24 0600  05/16/24 0651  NA 139 140  --   --  139 139 138  K 4.4 4.0  --   --  4.0 4.0 4.1  CL 102 103  --   --  104 104 103  CO2 23 22  --   --  25 24 25   GLUCOSE 156* 76  --   --  109* 123* 180*  BUN 22 21  --   --  12 12 16   CREATININE 1.09 0.99  --   --  0.85 0.81 0.80  CALCIUM  9.4 9.3  --   --  9.1 9.2 9.2  MG 1.8  --  1.7 1.6*  --  1.7 1.7  PHOS  --   --  2.8 2.7 2.8 2.5 3.4   Estimated Creatinine Clearance: 98 mL/min (by C-G formula based on SCr of 0.8 mg/dL). Liver & Pancreas: Recent Labs  Lab 05/11/24 1320 05/14/24 0917 05/15/24 0600 05/16/24 0651  AST 31  --   --   --   ALT  20  --   --   --   ALKPHOS 100  --   --   --   BILITOT 0.6  --   --   --   PROT 7.6  --   --   --   ALBUMIN 3.8 3.6 3.5 3.3*   No results for input(s): LIPASE, AMYLASE in the last 168 hours. No results for input(s): AMMONIA in the last 168 hours. Diabetic: No results for input(s): HGBA1C in the last 72 hours.  Recent Labs  Lab 05/15/24 1632 05/15/24 2128 05/15/24 2348 05/16/24 0341 05/16/24 0716  GLUCAP 143* 143* 166* 153* 164*   Cardiac Enzymes: No results for input(s): CKTOTAL, CKMB, CKMBINDEX, TROPONINI in the last 168 hours. No results for input(s): PROBNP in the last 8760 hours. Coagulation Profile: Recent Labs  Lab 05/11/24 1320  INR 1.0   Thyroid Function Tests: No results for input(s): TSH, T4TOTAL, FREET4, T3FREE, THYROIDAB in the last 72 hours. Lipid Profile: No results for input(s): CHOL, HDL, LDLCALC, TRIG, CHOLHDL, LDLDIRECT in the last 72 hours.  Anemia Panel: No results for input(s): VITAMINB12, FOLATE, FERRITIN, TIBC, IRON, RETICCTPCT in the last 72 hours. Urine analysis:    Component Value Date/Time   COLORURINE STRAW (A) 02/03/2023 1630   APPEARANCEUR CLEAR (A) 02/03/2023 1630   LABSPEC 1.022 02/03/2023 1630   PHURINE 6.0 02/03/2023 1630   GLUCOSEU >=500 (A) 02/03/2023 1630   HGBUR NEGATIVE 02/03/2023 1630    BILIRUBINUR NEGATIVE 02/03/2023 1630   KETONESUR NEGATIVE 02/03/2023 1630   PROTEINUR 30 (A) 02/03/2023 1630   NITRITE NEGATIVE 02/03/2023 1630   LEUKOCYTESUR NEGATIVE 02/03/2023 1630   Sepsis Labs: Invalid input(s): PROCALCITONIN, LACTICIDVEN  Microbiology: No results found for this or any previous visit (from the past 240 hours).  Radiology Studies: DG Swallowing Func-Speech Pathology Result Date: 05/16/2024 Table formatting from the original result was not included. Modified Barium Swallow Study Patient Details Name: Juan Harper MRN: [id number] Date of Birth: 1960-06-07 Today's Date: 05/16/2024 HPI/PMH: HPI: Per H&P, RAYQUON USELMAN is a 63 y.o. year old male with medical history of HTN, HLD, DMII,  presenting to the ED after having worsening slurred speech. MRI: 1. Acute nonhemorrhagic 10mm infarct in the lateral aspect of the thalamus and  posterior limb of the left internal capsule.  2. Two acute punctate infarcts in the posterior limb of the right internal  capsule.  3. Moderate atrophy and confluent periventricular white matter changes  extending to the subcortical regions bilaterally. This most likely reflects the  sequelae of chronic microvascular ischemia.  4. Remote lacunar infarcts in the inferior cerebellum bilaterally and remote  ischemic changes extending into the brainstem.   Patient was symptomatic for over 24 hours before arrival to ED.  Family is concerned about his living situation.  Toxicology report indicating positive for cocaine. Clinical Impression: Clinical Impression: Pt presents with moderate oropharyngeal dysphagia. Pharyngeal weakness notable for reduced BOT retraction, pharyngeal constriction, hyolaryngeal excursion, UES opening, and laryngeal closure, facilitating aspiration/penetration during/before swallow with thin liquids and trace penetration after the swallow of more viscous trials (thickened liquids). Pt not consistently sensate to aspiration of thin liquids.  Residue collecting with majority at the vallecula and min at pyriform sinus. Cognition limiting use of compensatory strategies like triple swallow and effortful swallow to aid residue clearance. Oral phase significantly prolonged with intermittent oral holding, lingual pumping, piecemeal deglutition, and reduced labial seal, leading to anterior loss and trace-min oral residue. Safest recommendation would be NPO with allowance of  medications crushed in puree. SLP will follow up with therapeutic trials and introduction of pharyngeal strengthening methods. Pt likely to need time for recovery - would consider PEG. MD, RN, and dietician aware of recommendations. Factors that may increase risk of adverse event in presence of aspiration [name]: Factors that may increase risk of adverse event in presence of aspiration [name]: Reduced cognitive function; Limited mobility; Frail or deconditioned; Dependence for feeding and/or oral hygiene; Weak cough; Presence of tubes (ETT, trach, NG, etc.) Recommendations/Plan: Swallowing Evaluation Recommendations Swallowing Evaluation Recommendations Recommendations: NPO except meds Medication Administration: Crushed with puree Oral care recommendations: Oral care QID (4x/day); Staff/trained caregiver to provide oral care Treatment Plan Treatment Plan Treatment recommendations: Therapy as outlined in treatment plan below Follow-up recommendations: Follow physicians's recommendations for discharge plan and follow up therapies Functional status assessment: Patient has had a recent decline in their functional status and/or demonstrates limited ability to make significant improvements in function in a reasonable and predictable amount of time. Treatment frequency: Min 2x/week Treatment duration: 2 weeks Interventions: Oropharyngeal exercises; Compensatory techniques; Patient/family education; Aspiration precaution training Recommendations Recommendations for  follow up therapy are one component of a multi-disciplinary discharge planning process, led by the attending physician.  Recommendations may be updated based on patient status, additional functional criteria and insurance authorization. Assessment: Orofacial Exam: Orofacial Exam Oral Cavity: Oral Hygiene: WFL Oral Cavity - Dentition: Edentulous Orofacial Anatomy: WFL Oral Motor/Sensory Function: Suspected cranial nerve impairment CN V - Trigeminal: Not tested CN VII - Facial: Right motor impairment CN IX - Glossopharyngeal, CN X - Vagus: WFL CN XII - Hypoglossal: WFL Anatomy: Anatomy: Presence of cervical hardware (Cervical Fusion ACDF and corpectomy 09/08/16) Boluses Administered: Boluses Administered Boluses Administered: Thin liquids (Level 0); Mildly thick liquids (Level 2, nectar thick); Moderately thick liquids (Level 3, honey thick); Puree  Oral Impairment Domain: Oral Impairment Domain Lip Closure: Escape beyond mid-chin Tongue control during bolus hold: Not tested Bolus preparation/mastication: Disorganized chewing/mashing with solid pieces of bolus unchewed Bolus transport/lingual motion: Repetitive/disorganized tongue motion Oral residue: Residue collection on oral structures Location of oral residue : Tongue; Floor of mouth Initiation of pharyngeal swallow : Pyriform sinuses  Pharyngeal Impairment Domain: Pharyngeal Impairment Domain Soft palate elevation: No bolus between soft palate (SP)/pharyngeal wall (PW) Laryngeal elevation: Partial superior movement of thyroid cartilage/partial approximation of arytenoids to epiglottic petiole Anterior hyoid excursion: Partial anterior movement Epiglottic movement: Partial inversion Laryngeal vestibule closure: Incomplete, narrow column air/contrast in laryngeal vestibule Pharyngeal stripping wave : Present - diminished Pharyngeal contraction (A/P view only): N/A Pharyngoesophageal segment opening: Partial distention/partial duration, partial obstruction of flow  Tongue base retraction: Narrow column of contrast or air between tongue base and PPW Pharyngeal residue: Majority of contrast within or on pharyngeal structures Location of pharyngeal residue: Valleculae; Pyriform sinuses  Esophageal Impairment Domain: Esophageal Impairment Domain Esophageal clearance upright position: Complete clearance, esophageal coating Pill: Pill Consistency administered: -- (n/a) Penetration/Aspiration Scale Score: Penetration/Aspiration Scale Score 1.  Material does not enter airway: Puree 3.  Material enters airway, remains ABOVE vocal cords and not ejected out: Mildly thick liquids (Level 2, nectar thick); Moderately thick liquids (Level 3, honey thick); Thin liquids (Level 0) (penetration after the swallow- secondary to residue- shallow; noted with thin liquid during the swallow) 8.  Material enters airway, passes BELOW cords without attempt by patient to eject out (silent aspiration) : Thin liquids (Level 0) (before swallow) Compensatory Strategies: Compensatory Strategies Compensatory strategies: Yes Straw: Ineffective Ineffective Straw: Mildly  thick liquid (Level 2, nectar thick); Thin liquid (Level 0) Effortful swallow: Ineffective Ineffective Effortful Swallow: Moderately thick liquid (Level 3, honey thick) Multiple swallows: Ineffective Ineffective Multiple Swallows: Mildly thick liquid (Level 2, nectar thick); Moderately thick liquid (Level 3, honey thick)   General Information: Caregiver present: No  Diet Prior to this Study: NPO   Temperature : Normal   Respiratory Status: WFL   Supplemental O2: None (Room air)   History of Recent Intubation: No  Behavior/Cognition: Alert; Distractible Self-Feeding Abilities: Dependent for feeding (attempted to hold cup with ineffective feeding) Baseline vocal quality/speech: Hypophonia/low volume Volitional Cough: Unable to elicit Volitional Swallow: Able to elicit Exam Limitations: Poor positioning (shoulders slightly elevated) Goal Planning:  Prognosis for improved oropharyngeal function: Fair Barriers to Reach Goals: Cognitive deficits; Motivation; Time post onset; Severity of deficits No data recorded Patient/Family Stated Goal: none stated Consulted and agree with results and recommendations: Patient; Physician; Nurse; Dietitian Pain: Pain Assessment Pain Assessment: Faces Faces Pain Scale: 0 Breathing: 0 Negative Vocalization: 1 Facial Expression: 0 Body Language: 1 Consolability: 1 PAINAD Score: 3 End of Session: Start Time:SLP Start Time (ACUTE ONLY): 0830 Stop Time: SLP Stop Time (ACUTE ONLY): 0915 Time Calculation:SLP Time Calculation (min) (ACUTE ONLY): 45 min Charges: SLP Evaluations $ SLP Speech Visit: 1 Visit SLP Evaluations $MBS Swallow: 1 Procedure $Swallowing Treatment: 1 Procedure SLP visit diagnosis: SLP Visit Diagnosis: Dysphagia, oropharyngeal phase (R13.12) Past Medical History: Past Medical History: Diagnosis Date  Arthritis   NECK/ RIGHT KNEE  Asthma   Bell's palsy   BPH (benign prostatic hyperplasia)   Cough   chronic  Diabetes mellitus without complication (HCC)   Edema   legs/feet  Fusion of spine of cervical region   PLANNED FOR APRIL  HOH (hard of hearing)   Hypertension   Orthopnea   Shortness of breath dyspnea   Sleep apnea   cpap  Wears dentures   partial upper and lower  Wheezing  Past Surgical History: Past Surgical History: Procedure Laterality Date  BACK SURGERY    CATARACT EXTRACTION W/PHACO Right 07/31/2016  Procedure: CATARACT EXTRACTION PHACO AND INTRAOCULAR LENS PLACEMENT (IOC);  Surgeon: Adine Oneil Novak, MD;  Location: [facility];  Service: Ophthalmology;  Laterality: Right;  Lot # [id number] H US :00:26.9 AP%:6.8% CDE: 1.83  CATARACT EXTRACTION W/PHACO Left 06/24/2021  Procedure: CATARACT EXTRACTION PHACO AND INTRAOCULAR LENS PLACEMENT (IOC) LEFT DIABETIC;  Surgeon: Novak Adine Oneil, MD;  Location: [facility];  Service: Ophthalmology;  Laterality: Left;  3.35 0:23.9  COLONOSCOPY    NECK MASS EXCISION     PILONIDAL CYST EXCISION   Jordan Jarrett Clapp, MS, CCC-SLP Speech Language Pathologist Rehab Services; [facility] [phone] (ascom) Jordan J Clapp 05/16/2024, 9:38 AM      Twanda Stakes T. Shiven Junious [facility]  If 7PM-7AM, please contact night-coverage [email or web address] 05/16/2024, 10:13 AM   "

## 2024-05-16 NOTE — Progress Notes (Addendum)
 Nutrition Follow-up  DOCUMENTATION CODES:   Severe malnutrition in context of social or environmental circumstances  INTERVENTION:   -TF via NGT:   Osmolite 1.5 @ 50 ml/hr and increase by 10 ml every 12 hours to goal rate of 60 ml/hr.    60 ml Prosource TF20 BID     30 ml free water  flush every 4 hours   Tube feeding regimen provides 2320 kcal (100% of needs), 130 grams of protein, and 1097 ml of H2O. Total free water : 1277 ml daily   -Continue MVI with minerals daily via tube -Continue 100 mg thiamine  daily x 7 days via tube  -Monitor Mg, K, and Phos and replete as needed  -If patient experiences persistent hyperglycemia (defined as >=180 mg/dL (89.9 mmol/L on two or more occasions), recommend considering initiation of insulin  therapy and/or consulting diabetes coordinator team for further recommendations  -Discussed recommendations of PEG with RN, MD, and SLP  NUTRITION DIAGNOSIS:   Severe Malnutrition related to social / environmental circumstances as evidenced by moderate fat depletion, severe fat depletion, moderate muscle depletion, severe muscle depletion.  Ongoing  GOAL:   Patient will meet greater than or equal to 90% of their needs  Met with TF  MONITOR:   TF tolerance  REASON FOR ASSESSMENT:   Consult Assessment of nutrition requirement/status  ASSESSMENT:   63 y.o. year old male with medical history of HTN, HLD, DMII presenting after having worsening slurred speech.  12/18- s/p BSE- NPO 12/19- s/p BSE- NPO 12/20- s/p BSE- NPO, NGT placed to start TF (tip and side port in stomach per KUB) 12/21- patient pulled out NGT, NGT replaced (tip of tube in distal stomach per KUB), s/p BSE- NPO 12/22- s/p MBSS- NPO- will likely need PEG  Reviewed I/Os: +719 ml x 24 hours and +3.3 L since admission  UOP: 500 ml x 24 hours  Per neurology, CT angio head and neck showed moderate to severe bilateral ICA stenosis of the cavernous segments, moderate left A2  stenosis, severe multifocal left P2 and distal right P3 stenosis., Focal moderate to severe stenosis involving the proximal left P1 segment and focal severe stenosis involving the distal left V4 segment.   Case discussed with RN, MD, and SLP. Patient underwent MBSS today; his cognition is limiting use of compensatory strategies like triple swallow and effortful swallow. Patient will need time to recover and a safe diet cannot be recommended at this time. RD recommended PEG placement if this aligns with patient's goals of care.   Patient sitting up in bed at time of visit. Patient arouse briefly when name was called and quickly went back to sleep. No family at bedside to provide additional history.   Patient is NPO and receiving TF via NGT for sole source nutrition. Osmolite 1.5 infusing at 50 ml/hr.   Medications reviewed and include thiamine  and magnesium  sulfate.   Labs reviewed: K, Mg, and Phos WDL. CBGS: 122-166 (inpatient orders for glycemic control are 0-5 units insulin  aspart daily at bedtime and 0-9 units insulin  aspart TID with meals). Urine drug screen positive for cocaine.   Diet Order:   Diet Order             Diet NPO time specified  Diet effective now                   EDUCATION NEEDS:   Not appropriate for education at this time  Skin:  Skin Assessment: Reviewed RN Assessment  Last BM:  05/14/24  Height:   Ht Readings from Last 1 Encounters:  05/11/24 5' 11 (1.803 m)    Weight:   Wt Readings from Last 1 Encounters:  05/16/24 82.2 kg    Ideal Body Weight:  78.2 kg  BMI:  Body mass index is 25.27 kg/m.  Estimated Nutritional Needs:   Kcal:  2300-2500  Protein:  115-130 grams  Fluid:  2.0-2.2 L    Margery ORN, RD, LDN, CDCES Registered Dietitian III Certified Diabetes Care and Education Specialist If unable to reach this RD, please use RD Inpatient group chat on secure chat between hours of 8am-4 pm daily

## 2024-05-16 NOTE — Progress Notes (Signed)
 Occupational Therapy Treatment Patient Details Name: Juan Harper MRN: [id number] DOB: 1961/04/24 Today's Date: 05/16/2024   History of present illness Pt is a 63 y.o. year old male presenting to the ED on 05/11/24 after having worsening slurred speech, weakness, confusion and falls. 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 PMH of HTN, HLD, DMII   OT comments  Pt is supine in bed on arrival. Easily arousable and agreeable to OT session. He did not report or appear in any pain. Pt performed bed mobility with Mod A x1-2 with increased time, cues for hand placement. Initially required CGA for seated balance at EOB, progressing to Min/Mod A with R lateral lean noted with increased fatigue and lethargy. Noted with bowel incontinence multiple times during session, 2 standing bouts performed requiring Min/Mod A x2 from elevated bed height to stand and Min A to maintain standing balance during peri-care. Total assist for standing peri-care. Pt more lethargic this date with new R lateral lean noted as compared to evaluation last week. Pt returned to bed with all needs in place and will cont to require skilled acute OT services to maximize his safety and IND to return to PLOF.       If plan is discharge home, recommend the following:  A lot of help with bathing/dressing/bathroom;Assist for transportation;Assistance with cooking/housework;Help with stairs or ramp for entrance;Direct supervision/assist for medications management;A lot of help with walking and/or transfers   Equipment Recommendations  Other (comment) (defer to next  venue)    Recommendations for Other Services      Precautions / Restrictions Precautions Precautions: Fall Recall of Precautions/Restrictions: Impaired Precaution/Restrictions Comments: NGT for feeding  (NGT for feeding) Restrictions Weight Bearing Restrictions Per Provider Order: No       Mobility Bed Mobility Overal bed mobility: Needs Assistance Bed Mobility: Supine to Sit, Sit to Supine     Supine to sit: Mod assist, HOB elevated, Used rails Sit to supine: Mod assist, +2 for physical assistance, Used rails   General bed mobility comments: BLE management and cues for hand placement for pt to sit up at EOB; +2 assist to return to supine d/t BM incont at end of session    Transfers Overall transfer level: Needs assistance Equipment used: Rolling walker (2 wheels) Transfers: Sit to/from Stand Sit to Stand: Mod assist, Min assist, +2 physical assistance, From elevated surface           General transfer comment: 2 standing bouts from EOB to RW with R lateral lean noted, bed height elevated and +2 assist for safety, lethargic, minimally verbal     Balance Overall balance assessment: Needs assistance Sitting-balance support: Feet supported Sitting balance-Leahy Scale: Poor Sitting balance - Comments: with fatigue R lateral lean noted with Mod A to maintain balance; most of the time CGA   Standing balance support: Bilateral upper extremity supported, Reliant on assistive device for balance, During functional activity Standing balance-Leahy Scale: Poor Standing balance comment: +2 and R lateral lean, BUE support on RW                           ADL either performed or assessed with clinical judgement   ADL Overall ADL's : Needs assistance/impaired  Toileting- Clothing Manipulation and Hygiene: Total assistance;Sit to/from stand Toileting - Clothing Manipulation Details (indicate cue type and reason): total assist standing at EOB with x1 assist to maintain standing balance at RW with R lateral lean noted; noted with continued BM incont in bed/at EOB            Extremity/Trunk Assessment              Vision        Perception     Praxis     Communication Communication Communication: Impaired Factors Affecting Communication: Difficulty expressing self;Reduced clarity of speech   Cognition Arousal: Lethargic Behavior During Therapy: Flat affect                                 Following commands: Impaired Following commands impaired: Follows one step commands inconsistently, Follows one step commands with increased time      Cueing   Cueing Techniques: Verbal cues, Gestural cues, Tactile cues  Exercises      Shoulder Instructions       General Comments incont BMs during session, NGT intact    Pertinent Vitals/ Pain       Pain Assessment Pain Assessment: Faces Faces Pain Scale: No hurt Pain Intervention(s): Monitored during session, Repositioned  Home Living                                          Prior Functioning/Environment              Frequency  Min 2X/week        Progress Toward Goals  OT Goals(current goals can now be found in the care plan section)  Progress towards OT goals: Progressing toward goals  Acute Rehab OT Goals OT Goal Formulation: Patient unable to participate in goal setting Time For Goal Achievement: 05/26/24 Potential to Achieve Goals: Fair  Plan      Co-evaluation    PT/OT/SLP Co-Evaluation/Treatment: Yes Reason for Co-Treatment: For patient/therapist safety;To address functional/ADL transfers PT goals addressed during session: Mobility/safety with mobility OT goals addressed during session: ADL's and self-care      AM-PAC OT 6 Clicks Daily Activity     Outcome Measure   Help from another person eating meals?: A Little Help from another person taking care of personal grooming?: A Little Help from another person toileting, which includes using toliet, bedpan, or urinal?: A Lot Help from another person bathing (including washing, rinsing, drying)?: A Lot Help from another person to put on and  taking off regular upper body clothing?: A Little Help from another person to put on and taking off regular lower body clothing?: A Lot 6 Click Score: 15    End of Session Equipment Utilized During Treatment: Gait belt;Rolling walker (2 wheels)  OT Visit Diagnosis: Other abnormalities of gait and mobility (R26.89);Muscle weakness (generalized) (M62.81);Unsteadiness on feet (R26.81)   Activity Tolerance Patient tolerated treatment well   Patient Left in bed;with call bell/phone within reach;with bed alarm set   Nurse Communication Mobility status        Time: 8941-8875 OT Time Calculation (min): 26 min  Charges: OT General Charges $OT Visit: 1 Visit OT Treatments $Self Care/Home Management : 8-22 mins  Shawndell Schillaci Chrismon, OTR/L  05/16/2024, 1:30 PM   Lachele Lievanos E Chrismon 05/16/2024, 1:26 PM

## 2024-05-16 NOTE — TOC Progression Note (Addendum)
 Transition of Care [facility]) - Progression Note    Patient Details  Name: Juan Harper MRN: [id number] Date of Birth: 04/08/1961  Transition of Care [facility]) CM/SW Contact  Victory Jackquline RAMAN, RN Phone Number: 05/16/2024, 2:40 PM  Clinical Narrative:    RNCM called patient's daughter Delon @ [phone] and her voicemail was full and unable to leave a message, call place to patient's son Deon @ [phone] and left a message. RNCM was calling to review bed offers with family. Awaiting a call back. RNCM will continue to follow for discharge planning needs.  4:03 pm: RNCM received a call back from the patient's son Deon. Spoke to son and he would like to wait for other bed offers to come in. States that the rating are to low for the facilities that have accepted the patient.                    Expected Discharge Plan and Services                                               Social Drivers of Health (SDOH) Interventions SDOH Screenings   Food Insecurity: Patient Unable To Answer (05/12/2024)  Housing: Patient Unable To Answer (05/12/2024)  Transportation Needs: Patient Unable To Answer (05/12/2024)  Utilities: Patient Unable To Answer (05/12/2024)  Financial Resource Strain: High Risk (06/18/2023)   Received from [facility]  Social Connections: Patient Unable To Answer (05/12/2024)  Tobacco Use: High Risk (05/11/2024)    Readmission Risk Interventions     No data to display

## 2024-05-16 NOTE — Plan of Care (Signed)

## 2024-05-16 NOTE — Progress Notes (Signed)
 Modified Barium Swallow Study  Patient Details  Name: Juan Harper MRN: [id number] Date of Birth: July 18, 1960  Today's Date: 05/16/2024  Modified Barium Swallow completed.  Full report located under Chart Review in the Imaging Section.  History of Present Illness Per H&P, Juan Harper is a 63 y.o. year old male with medical history of HTN, HLD, DMII,  presenting to the ED after having worsening slurred speech. MRI: 1. Acute nonhemorrhagic 10mm infarct in the lateral aspect of the thalamus and  posterior limb of the left internal capsule.  2. Two acute punctate infarcts in the posterior limb of the right internal  capsule.  3. Moderate atrophy and confluent periventricular white matter changes  extending to the subcortical regions bilaterally. This most likely reflects the  sequelae of chronic microvascular ischemia.  4. Remote lacunar infarcts in the inferior cerebellum bilaterally and remote  ischemic changes extending into the brainstem.   Patient was symptomatic for over 24 hours before arrival to ED.  Family is concerned about his living situation.  Toxicology report indicating positive for cocaine.    Clinical Impression Pt presents with moderate oropharyngeal dysphagia. Pharyngeal weakness notable for reduced BOT retraction, pharyngeal constriction, hyolaryngeal excursion, UES opening, and laryngeal closure, facilitating aspiration/penetration during/before swallow with thin liquids and trace penetration after the swallow of more viscous trials (thickened liquids). Pt not consistently sensate to aspiration of thin liquids. Residue collecting with majority at the vallecula and min at pyriform sinus. Cognition limiting use of compensatory strategies like triple swallow and effortful swallow to aid residue clearance. Oral phase significantly prolonged with intermittent oral holding, lingual pumping, piecemeal deglutition, and reduced labial seal, leading to anterior loss and trace-min oral  residue. Safest recommendation would be NPO with allowance of medications crushed in puree. SLP will follow up with therapeutic trials and introduction of pharyngeal strengthening methods. Pt likely to need time for recovery - would consider PEG. MD, RN, and dietician aware of recommendations.   Factors that may increase risk of adverse event in presence of aspiration Juan Harper & Juan Harper 2021): Reduced cognitive function;Limited mobility;Frail or deconditioned;Dependence for feeding and/or oral hygiene;Weak cough;Presence of tubes (ETT, trach, NG, etc.)  Swallow Evaluation Recommendations Recommendations: NPO except meds Medication Administration: Crushed with puree Oral care recommendations: Oral care QID (4x/day);Staff/trained caregiver to provide oral care   Juan Hanna Clapp, MS, CCC-SLP Speech Language Pathologist Rehab Services; [facility] [phone] (ascom)    Juan Mclaine Clapp, MS, CCC-SLP Speech Language Pathologist Rehab Services; [facility] [phone] (ascom)   Juan Harper 05/16/2024,9:24 AM

## 2024-05-16 NOTE — Progress Notes (Signed)
 PIV consult discussed with RN. For vessel preservation, recommend waiting until IV med is ordered/ needed before re-inserting PIV. Consult canceled.

## 2024-05-17 ENCOUNTER — Inpatient Hospital Stay

## 2024-05-17 DIAGNOSIS — I639 Cerebral infarction, unspecified: Secondary | ICD-10-CM | POA: Diagnosis not present

## 2024-05-17 LAB — GLUCOSE, CAPILLARY
Glucose-Capillary: 130 mg/dL — ABNORMAL HIGH (ref 70–99)
Glucose-Capillary: 133 mg/dL — ABNORMAL HIGH (ref 70–99)
Glucose-Capillary: 151 mg/dL — ABNORMAL HIGH (ref 70–99)
Glucose-Capillary: 155 mg/dL — ABNORMAL HIGH (ref 70–99)
Glucose-Capillary: 184 mg/dL — ABNORMAL HIGH (ref 70–99)
Glucose-Capillary: 193 mg/dL — ABNORMAL HIGH (ref 70–99)
Glucose-Capillary: 200 mg/dL — ABNORMAL HIGH (ref 70–99)

## 2024-05-17 NOTE — TOC Progression Note (Signed)
 Transition of Care [facility]) - Progression Note    Patient Details  Name: Juan Harper MRN: [id number] Date of Birth: 1961-02-12  Transition of Care [facility]) CM/SW Contact  Nathanael CHRISTELLA Ring, RN Phone Number: 05/17/2024, 11:40 AM  Clinical Narrative:     [facility] in [place] was able to offer a bed.  Called and spoke with patient's son, Deon, presented bed offers and he agrees to accept the bed offer for [facility].  Patient is not medically stable for discharge, he has a NG tube with tube feeds and updated by Speech that he failed his swallow study yesterday and there will be a recommendation for a PEG tube.  Deon reports that he lives in [place] and is trying to work out his father's living conditions for when he finishes with rehab.  He is working with a case worker to find appropriate housing.                     Expected Discharge Plan and Services                                               Social Drivers of Health (SDOH) Interventions SDOH Screenings   Food Insecurity: Patient Unable To Answer (05/12/2024)  Housing: Patient Unable To Answer (05/12/2024)  Transportation Needs: Patient Unable To Answer (05/12/2024)  Utilities: Patient Unable To Answer (05/12/2024)  Financial Resource Strain: High Risk (06/18/2023)   [facility]  Social Connections: Patient Unable To Answer (05/12/2024)  Tobacco Use: High Risk (05/11/2024)    Readmission Risk Interventions     No data to display

## 2024-05-17 NOTE — Progress Notes (Signed)
 " Progress Note   Patient: Juan Harper FMW:[id number] DOB: 1960-07-26 DOA: 05/11/2024     5 DOS: the patient was seen and examined on 05/17/2024   Brief hospital course:  63 year old M with PMH of DM-2, HTN, HLD and cocaine use brought to ED due to slurred speech and difficulty speaking.  Patient was symptomatic for over 24 hours before arrival to ED   In ED, stable vitals.  Labs without significant finding.  EtOH level negative.  CT head showed acute/subacute stroke in left internal capsule with remote bilateral thalamic strokes.  Neurology consulted.     MRI brain showed acute nonhemorrhagic 10 mm infarct in the lateral aspect of thalamus and posterior limb of left IC, two acute punctate infarct in posterior limb of right IC and remote bilateral cerebral and brainstem CVA.   CT angio head and neck showed moderate to severe bilateral ICA stenosis of the cavernous segments, moderate left A2 stenosis, severe multifocal left P2 and distal right P3 stenosis.,  Focal moderate to severe stenosis involving the proximal left P1 segment and focal severe stenosis involving the distal left V4 segment.  TTE without significant finding   Still with dense dysarthria and severe dysphagia.  NG tube placed and started on TF.  SLP following.  Therapy recommended SNF.      Assessment and Plan:   Acute lacunar infarction:  Presents with slurred speech.   Right facial droop and relative weakness in left leg compared to right persists.   MRI of the brain without contrast shows acute nonhemorrhagic 10mm infarct in the lateral aspect of the thalamus and posterior limb of the left internal capsule. Two acute punctate infarcts in the posterior limb of the right internal capsule. Moderate atrophy and confluent periventricular white matter changes extending to the subcortical regions bilaterally. This most likely reflects the sequelae of chronic microvascular ischemia.Remote lacunar infarcts in the inferior  cerebellum bilaterally and remote ischemic changes extending into the brainstem.  TTE showed a normal LVEF with no evidence of an intra-atrial shunt.  LDL 154.  A1c 8.1%. -Neurology recommends aspirin  and Plavix  for 3 months followed by aspirin  monotherapy -Continue Lipitor  80 mg daily -Remains n.p.o. after MBS and meds may be administered crushed.  SLP following. -NGT replaced on 12/21.  Continue tube feed -PT/OT recommended SNF.    NIDDM-2 with hyperglycemia:  A1c 8.1%. Continue sliding scale insulin     Essential hypertension:  Improved blood pressure control. Continue amlodipine  and lisinopril     History of polysubstance use:  Prior UDS positive for cocaine marijuana    Hypomagnesemia Supplemented    Severe malnutrition Body mass index is 25.27 kg/m. Nutrition Problem: Severe Malnutrition Etiology: social / environmental circumstances Signs/Symptoms: moderate fat depletion, severe fat depletion, moderate muscle depletion, severe muscle depletion Interventions: Tube feeding        Subjective: Lying in bed.  Appears comfortable.  Dysarthria present  Physical Exam: Vitals:   05/17/24 0310 05/17/24 0500 05/17/24 0707 05/17/24 1107  BP: (!) 142/75  131/76 127/61  Pulse: 86  89 95  Resp: 16  16   Temp: 98.7 F (37.1 C)  98.5 F (36.9 C) 98.5 F (36.9 C)  TempSrc:   Oral Oral  SpO2: 100%  (!) 89% 93%  Weight:  83.6 kg    Height:       GENERAL: No apparent distress.  Nontoxic.  NG tube in place, dysarthria HEENT: MMM.  Vision and hearing grossly intact.  NECK: Supple.  No apparent JVD.  RESP:  No IWOB.  Fair aeration bilaterally. CVS:  RRR. Heart sounds normal.  ABD/GI/GU: BS+. Abd soft, NTND.  MSK/EXT:  Moves extremities. No apparent deformity. No edema.  SKIN: no apparent skin lesion or wound NEURO: Awake and alert.  Oriented appropriately.  Dysarthria.  Right facial droop.  Seems to have symmetric strength in all extremities.  Patellar reflex symmetric.   No pronator drift.  Patient was not cooperative to assess facial droop PSYCH: Calm. Normal affect.    Data Reviewed: No new labs There are no new results to review at this time.  Family Communication: None  Disposition: Status is: Inpatient Remains inpatient appropriate because: Awaiting discharge to SNF  Planned Discharge Destination: Skilled nursing facility    Time spent: 40 minutes  Author: Aimee Somerset, MD 05/17/2024 12:42 PM  For on call review [email or web address].  "

## 2024-05-17 NOTE — Plan of Care (Signed)
  Problem: Ischemic Stroke/TIA Tissue Perfusion: Goal: Complications of ischemic stroke/TIA will be minimized Outcome: Progressing   Problem: Health Behavior/Discharge Planning: Goal: Ability to manage health-related needs will improve Outcome: Progressing Goal: Goals will be collaboratively established with patient/family Outcome: Progressing   Problem: Nutrition: Goal: Risk of aspiration will decrease Outcome: Progressing Goal: Dietary intake will improve Outcome: Progressing

## 2024-05-17 NOTE — [place] FL2 (Signed)
 " [place]  MEDICAID FL2 LEVEL OF CARE FORM     IDENTIFICATION  Patient Name: Juan Harper Birthdate: Mar 08, 1961 Sex: male Admission Date (Current Location): 05/11/2024  [facility] and [place] Number:  Chiropodist and Address:  [facility], [address], [place], [place] [address]      Provider Number: [id number]  Attending Physician Name and Address:  Lanetta Lingo, MD  Relative Name and Phone Number:  Elvis Delon Ahumada, Emergency Contact  [phone]    Current Level of Care: Hospital Recommended Level of Care: Skilled Nursing Facility Prior Approval Number:    Date Approved/Denied:   PASRR Number: [id number]  Discharge Plan: SNF    Current Diagnoses: Patient Active Problem List   Diagnosis Date Noted   Protein-calorie malnutrition, severe 05/14/2024   Cerebral infarction (HCC) 05/11/2024   Fall at home, initial encounter 02/07/2021   Syncope 02/07/2021   Right hip pain 02/07/2021   BPH (benign prostatic hyperplasia)    Chronic diastolic CHF (congestive heart failure) (HCC)    AKI (acute kidney injury) 02/06/2021   Acute kidney injury 01/06/2018   Hyperkalemia 01/06/2018   Acute renal failure (ARF) 02/26/2017   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   Sleep apnea 11/24/2016   Diabetes (HCC) 11/24/2016   HTN (hypertension) 11/24/2016   Dizziness 11/24/2016   Acute on chronic renal failure 09/19/2016   Cervical disc disease with myelopathy 09/07/2016   Simple chronic bronchitis (HCC) 07/16/2016   Tobacco use 07/16/2016   Closed fracture of multiple ribs of left side 02/07/2016   BPH with obstruction/lower urinary tract symptoms 05/23/2014   Elevated PSA 05/23/2014   Erectile dysfunction 05/23/2014   Chest pain 12/10/2010    Orientation RESPIRATION BLADDER Height & Weight     Self, Time, Situation, Place  Normal Incontinent Weight: 83.6 kg Height:  5' 11 (180.3 cm)   BEHAVIORAL SYMPTOMS/MOOD NEUROLOGICAL BOWEL NUTRITION STATUS      Incontinent Feeding tube, NG/panda (Tube feeds)  AMBULATORY STATUS COMMUNICATION OF NEEDS Skin   Extensive Assist Verbally Skin abrasions (hand and leg right and left)                       Personal Care Assistance Level of Assistance  Bathing, Feeding, Dressing Bathing Assistance: Maximum assistance Feeding assistance: Limited assistance Dressing Assistance: Maximum assistance     Functional Limitations Info             SPECIAL CARE FACTORS FREQUENCY  PT (By licensed PT), OT (By licensed OT), Speech therapy     PT Frequency: 5 x week OT Frequency: 5 x week     Speech Therapy Frequency: 5 x week      Contractures      Additional Factors Info  Code Status, Allergies Code Status Info: FULL Allergies Info: BEE Venom           Current Medications (05/17/2024):  This is the current hospital active medication list Current Facility-Administered Medications  Medication Dose Route Frequency Provider Last Rate Last Admin   acetaminophen  (TYLENOL ) tablet 650 mg  650 mg Per Tube Q6H PRN Niels Kayla FALCON, RPH       Or   acetaminophen  (TYLENOL ) suppository 650 mg  650 mg Rectal Q6H PRN Niels Kayla FALCON, RPH       amLODipine  (NORVASC ) tablet 10 mg  10 mg Per Tube Daily Gonfa, Taye T, MD   10 mg at 05/17/24 1014   aspirin   chewable tablet 81 mg  81 mg Per Tube Daily Gonfa, Taye T, MD   81 mg at 05/17/24 1014   atorvastatin  (LIPITOR ) tablet 80 mg  80 mg Per Tube Daily Gonfa, Taye T, MD   80 mg at 05/17/24 1014   clopidogrel  (PLAVIX ) tablet 75 mg  75 mg Per Tube Daily Gonfa, Taye T, MD   75 mg at 05/17/24 1015   feeding supplement (OSMOLITE 1.5 CAL) liquid 1,000 mL  1,000 mL Per Tube Continuous Gonfa, Taye T, MD 60 mL/hr at 05/17/24 1142 Infusion Verify at 05/17/24 1142   feeding supplement (PROSource TF20) liquid 60 mL  60 mL Per Tube BID Kathrin Simmer T, MD   60 mL at 05/17/24 1024   free water  30 mL  30  mL Per Tube Q4H Kathrin Simmer T, MD   30 mL at 05/17/24 1220   heparin  injection 5,000 Units  5,000 Units Subcutaneous Q8H Khan, Ghalib, MD   5,000 Units at 05/17/24 0615   hydrALAZINE  (APRESOLINE ) tablet 25 mg  25 mg Per Tube Q6H PRN Gonfa, Taye T, MD       insulin  aspart (novoLOG ) injection 0-5 Units  0-5 Units Subcutaneous QHS Fernand Prost, MD       insulin  aspart (novoLOG ) injection 0-9 Units  0-9 Units Subcutaneous TID WC Khan, Ghalib, MD   2 Units at 05/17/24 1220   lisinopril  (ZESTRIL ) tablet 40 mg  40 mg Per Tube Daily Gonfa, Taye T, MD   40 mg at 05/17/24 1014   multivitamin with minerals tablet 1 tablet  1 tablet Per Tube Daily Gonfa, Taye T, MD   1 tablet at 05/17/24 1014   ondansetron  (ZOFRAN ) tablet 4 mg  4 mg Per Tube Q6H PRN Niels Kayla FALCON, [facility]       Or   ondansetron  (ZOFRAN ) injection 4 mg  4 mg Intravenous Q6H PRN Greenwood, Howard F, RPH       polyethylene glycol (MIRALAX  / GLYCOLAX ) packet 17 g  17 g Per Tube BID PRN Gonfa, Taye T, MD       senna-docusate (Senokot-S) tablet 1 tablet  1 tablet Per Tube QHS PRN Gonfa, Taye T, MD       sodium chloride  flush (NS) 0.9 % injection 3 mL  3 mL Intravenous Q12H Khan, Ghalib, MD   3 mL at 05/17/24 1024   thiamine  (VITAMIN B1) tablet 100 mg  100 mg Per Tube Daily Gonfa, Taye T, MD   100 mg at 05/17/24 1014     Discharge Medications: Please see discharge summary for a list of discharge medications.  Relevant Imaging Results:  Relevant Lab Results:   Additional Information [id number]  Nathanael CHRISTELLA Ring, RN     "

## 2024-05-17 NOTE — Progress Notes (Signed)
 Per Dr Lanetta, dc NIH stroke scale order

## 2024-05-18 ENCOUNTER — Inpatient Hospital Stay

## 2024-05-18 DIAGNOSIS — I639 Cerebral infarction, unspecified: Secondary | ICD-10-CM | POA: Diagnosis not present

## 2024-05-18 LAB — GLUCOSE, CAPILLARY
Glucose-Capillary: 122 mg/dL — ABNORMAL HIGH (ref 70–99)
Glucose-Capillary: 160 mg/dL — ABNORMAL HIGH (ref 70–99)
Glucose-Capillary: 178 mg/dL — ABNORMAL HIGH (ref 70–99)
Glucose-Capillary: 180 mg/dL — ABNORMAL HIGH (ref 70–99)
Glucose-Capillary: 189 mg/dL — ABNORMAL HIGH (ref 70–99)
Glucose-Capillary: 192 mg/dL — ABNORMAL HIGH (ref 70–99)
Glucose-Capillary: 207 mg/dL — ABNORMAL HIGH (ref 70–99)
Glucose-Capillary: 230 mg/dL — ABNORMAL HIGH (ref 70–99)
Glucose-Capillary: 236 mg/dL — ABNORMAL HIGH (ref 70–99)

## 2024-05-18 LAB — BASIC METABOLIC PANEL WITH GFR
Anion gap: 10 (ref 5–15)
BUN: 30 mg/dL — ABNORMAL HIGH (ref 8–23)
CO2: 27 mmol/L (ref 22–32)
Calcium: 9.3 mg/dL (ref 8.9–10.3)
Chloride: 102 mmol/L (ref 98–111)
Creatinine, Ser: 1.04 mg/dL (ref 0.61–1.24)
GFR, Estimated: >60 mL/min
Glucose, Bld: 227 mg/dL — ABNORMAL HIGH (ref 70–99)
Potassium: 4.6 mmol/L (ref 3.5–5.1)
Sodium: 139 mmol/L (ref 135–145)

## 2024-05-18 MED ORDER — INSULIN ASPART 100 UNIT/ML IJ SOLN: 0.0000 [IU] | Freq: Three times a day (TID) | INTRAMUSCULAR | Status: DC

## 2024-05-18 MED ORDER — HALOPERIDOL LACTATE 5 MG/ML IJ SOLN
1.0000 mg | Freq: Four times a day (QID) | INTRAMUSCULAR | Status: DC | PRN
  Administered 2024-05-18 (×2): 1 mg via INTRAVENOUS
  Filled 2024-05-18 (×2): qty 1

## 2024-05-18 MED ORDER — INSULIN GLARGINE 100 UNIT/ML ~~LOC~~ SOLN
10.0000 [IU] | Freq: Every day | SUBCUTANEOUS | Status: DC
  Administered 2024-05-18 – 2024-05-23 (×6): 10 [IU] via SUBCUTANEOUS
  Filled 2024-05-18 (×5): qty 0.1

## 2024-05-18 MED ORDER — FREE WATER
100.0000 mL | Status: DC
  Administered 2024-05-18 – 2024-05-23 (×29): 100 mL

## 2024-05-18 MED ORDER — INSULIN ASPART 100 UNIT/ML IJ SOLN
0.0000 [IU] | INTRAMUSCULAR | Status: DC
  Administered 2024-05-18: 5 [IU] via SUBCUTANEOUS
  Administered 2024-05-18 – 2024-05-20 (×11): 3 [IU] via SUBCUTANEOUS
  Administered 2024-05-20: 8 [IU] via SUBCUTANEOUS
  Administered 2024-05-21: 2 [IU] via SUBCUTANEOUS
  Administered 2024-05-21: 3 [IU] via SUBCUTANEOUS
  Administered 2024-05-21: 8 [IU] via SUBCUTANEOUS
  Administered 2024-05-21: 3 [IU] via SUBCUTANEOUS
  Administered 2024-05-21: 5 [IU] via SUBCUTANEOUS
  Administered 2024-05-22: 2 [IU] via SUBCUTANEOUS
  Administered 2024-05-22 (×2): 3 [IU] via SUBCUTANEOUS
  Administered 2024-05-22: 2 [IU] via SUBCUTANEOUS
  Administered 2024-05-22: 3 [IU] via SUBCUTANEOUS
  Administered 2024-05-23: 5 [IU] via SUBCUTANEOUS
  Administered 2024-05-23 (×2): 3 [IU] via SUBCUTANEOUS
  Administered 2024-05-23: 2 [IU] via SUBCUTANEOUS
  Filled 2024-05-18 (×9): qty 3
  Filled 2024-05-18 (×2): qty 5
  Filled 2024-05-18: qty 2
  Filled 2024-05-18 (×3): qty 3
  Filled 2024-05-18: qty 8
  Filled 2024-05-18: qty 3
  Filled 2024-05-18: qty 2
  Filled 2024-05-18 (×3): qty 3
  Filled 2024-05-18: qty 8
  Filled 2024-05-18: qty 3

## 2024-05-18 NOTE — Progress Notes (Signed)
 OT Cancellation Note  Patient Details Name: Juan Harper MRN: [id number] DOB: 1960/11/26   Cancelled Treatment:    Reason Eval/Treat Not Completed: Patient at procedure or test/ unavailable Pt restless, pulled out NGT last night, in restraints for safety, currently being transported off the unit for CT. OT will re-attempt at later date as medically appropriate.   Payson Evrard L. Paulette Rockford, OTR/L  05/18/2024, 12:49 PM

## 2024-05-18 NOTE — Plan of Care (Signed)
" °  Problem: Education: Goal: Knowledge of disease or condition will improve Outcome: Not Progressing Goal: Knowledge of secondary prevention will improve (MUST DOCUMENT ALL) Outcome: Not Progressing Goal: Knowledge of patient specific risk factors will improve (DELETE if not current risk factor) Outcome: Not Progressing   Problem: Ischemic Stroke/TIA Tissue Perfusion: Goal: Complications of ischemic stroke/TIA will be minimized Outcome: Not Progressing   Problem: Coping: Goal: Will verbalize positive feelings about self Outcome: Not Progressing Goal: Will identify appropriate support needs Outcome: Not Progressing   Problem: Health Behavior/Discharge Planning: Goal: Ability to manage health-related needs will improve Outcome: Not Progressing Goal: Goals will be collaboratively established with patient/family Outcome: Not Progressing   Problem: Self-Care: Goal: Ability to participate in self-care as condition permits will improve Outcome: Not Progressing Goal: Verbalization of feelings and concerns over difficulty with self-care will improve Outcome: Not Progressing Goal: Ability to communicate needs accurately will improve Outcome: Not Progressing   Problem: Nutrition: Goal: Risk of aspiration will decrease Outcome: Not Progressing Goal: Dietary intake will improve Outcome: Not Progressing   Problem: Education: Goal: Ability to describe self-care measures that may prevent or decrease complications (Diabetes Survival Skills Education) will improve Outcome: Not Progressing Goal: Individualized Educational Video(s) Outcome: Not Progressing   Problem: Coping: Goal: Ability to adjust to condition or change in health will improve Outcome: Not Progressing   Problem: Fluid Volume: Goal: Ability to maintain a balanced intake and output will improve Outcome: Not Progressing   Problem: Health Behavior/Discharge Planning: Goal: Ability to identify and utilize available  resources and services will improve Outcome: Not Progressing Goal: Ability to manage health-related needs will improve Outcome: Not Progressing   Problem: Metabolic: Goal: Ability to maintain appropriate glucose levels will improve Outcome: Not Progressing   Problem: Nutritional: Goal: Maintenance of adequate nutrition will improve Outcome: Not Progressing Goal: Progress toward achieving an optimal weight will improve Outcome: Not Progressing   Problem: Skin Integrity: Goal: Risk for impaired skin integrity will decrease Outcome: Not Progressing   Problem: Tissue Perfusion: Goal: Adequacy of tissue perfusion will improve Outcome: Not Progressing   Problem: Education: Goal: Knowledge of General Education information will improve Description: Including pain rating scale, medication(s)/side effects and non-pharmacologic comfort measures Outcome: Not Progressing   Problem: Health Behavior/Discharge Planning: Goal: Ability to manage health-related needs will improve Outcome: Not Progressing   Problem: Clinical Measurements: Goal: Ability to maintain clinical measurements within normal limits will improve Outcome: Not Progressing Goal: Will remain free from infection Outcome: Not Progressing Goal: Diagnostic test results will improve Outcome: Not Progressing Goal: Respiratory complications will improve Outcome: Not Progressing Goal: Cardiovascular complication will be avoided Outcome: Not Progressing   Problem: Activity: Goal: Risk for activity intolerance will decrease Outcome: Not Progressing   Problem: Nutrition: Goal: Adequate nutrition will be maintained Outcome: Not Progressing   Problem: Coping: Goal: Level of anxiety will decrease Outcome: Not Progressing   Problem: Elimination: Goal: Will not experience complications related to bowel motility Outcome: Not Progressing Goal: Will not experience complications related to urinary retention Outcome: Not  Progressing   Problem: Pain Managment: Goal: General experience of comfort will improve and/or be controlled Outcome: Not Progressing   Problem: Safety: Goal: Ability to remain free from injury will improve Outcome: Not Progressing   Problem: Skin Integrity: Goal: Risk for impaired skin integrity will decrease Outcome: Not Progressing   Problem: Safety: Goal: Non-violent Restraint(s) Outcome: Not Progressing   "

## 2024-05-18 NOTE — Plan of Care (Signed)
" °  Problem: Nutrition: Goal: Risk of aspiration will decrease Outcome: Progressing Goal: Dietary intake will improve Outcome: Progressing   Problem: Clinical Measurements: Goal: Respiratory complications will improve Outcome: Progressing Goal: Cardiovascular complication will be avoided Outcome: Progressing   Problem: Elimination: Goal: Will not experience complications related to bowel motility Outcome: Progressing Goal: Will not experience complications related to urinary retention Outcome: Progressing   "

## 2024-05-18 NOTE — Plan of Care (Signed)
" °  Problem: Nutrition: Goal: Risk of aspiration will decrease Outcome: Progressing Goal: Dietary intake will improve 05/18/2024 1901 by Clois Bridgette BIRCH, RN Outcome: Progressing 05/18/2024 1900 by Clois Bridgette BIRCH, RN Outcome: Progressing   Problem: Skin Integrity: Goal: Risk for impaired skin integrity will decrease Outcome: Progressing   Problem: Clinical Measurements: Goal: Respiratory complications will improve 05/18/2024 1901 by Clois Bridgette BIRCH, RN Outcome: Progressing 05/18/2024 1900 by Clois Bridgette BIRCH, RN Outcome: Progressing Goal: Cardiovascular complication will be avoided Outcome: Progressing   Problem: Elimination: Goal: Will not experience complications related to bowel motility Outcome: Progressing Goal: Will not experience complications related to urinary retention Outcome: Progressing   "

## 2024-05-18 NOTE — Progress Notes (Signed)
 Nutrition Follow-up  DOCUMENTATION CODES:   Severe malnutrition in context of social or environmental circumstances  INTERVENTION:   -TF via NGT:   Osmolite 1.5 @ 60 ml/hr   60 ml Prosource TF20 BID     30 ml free water  flush every 4 hours   Tube feeding regimen provides 2320 kcal (100% of needs), 130 grams of protein, and 1097 ml of H2O. Total free water : 1277 ml daily   -Continue MVI with minerals daily via tube -Continue 100 mg thiamine  daily x 7 days via tube  -Monitor Mg, K, and Phos and replete as needed  -RD consulted DM coordinator secondary to hyperglycemia; discussed need for possible TF coverage  NUTRITION DIAGNOSIS:   Severe Malnutrition related to social / environmental circumstances as evidenced by moderate fat depletion, severe fat depletion, moderate muscle depletion, severe muscle depletion.  Ongoing  GOAL:   Patient will meet greater than or equal to 90% of their needs  Progressing   MONITOR:   TF tolerance  REASON FOR ASSESSMENT:   Consult Assessment of nutrition requirement/status  ASSESSMENT:   63 y.o. year old male with medical history of HTN, HLD, DMII presenting after having worsening slurred speech.  12/18- s/p BSE- NPO 12/19- s/p BSE- NPO 12/20- s/p BSE- NPO, NGT placed to start TF (tip and side port in stomach per KUB) 12/21- patient pulled out NGT, NGT replaced (tip of tube in distal stomach per KUB), s/p BSE- NPO 12/22- s/p MBSS- NPO- will likely need PEG 12/23- KUB reveals tip of NGT in stomach  Reviewed I/O's: +1.8 L x 24 hours and +5.2 L since admission   Per neurology, CT angio head and neck showed moderate to severe bilateral ICA stenosis of the cavernous segments, moderate left A2 stenosis, severe multifocal left P2 and distal right P3 stenosis., Focal moderate to severe stenosis involving the proximal left P1 segment and focal severe stenosis involving the distal left V4 segment.   Patient sitting up in bed at time of  visit. He is a little more alert and interactive today. He answered I'm okay and I don't know to most questions asked.   Case discussed with LPN, who reports patient pulled out his NGT again over night and was replaced. Osmolite 1.5 infusing at goal rate of 60 ml/hr. Patient tolerating well. Per LPN, consult has been placed by IR for g-tube placement.   Patient NPO per MBSS on 05/16/24; SLP anticipates long term recovery for dysphagia. 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   Reviewed weight history; weight has ranged from 82.2-85.1 kg since admission.   Per TOC notes, plan for SNF placement at discharge (patient has a bed offer at [facility]).   Medications reviewed and include thiamine .   Labs reviewed: CBGS: 122-230 (inpatient orders for glycemic control are 0-15 units insulin  aspart TID with meals and 10 units insulin  glargine daily). Noted 2 episodes of hyperglycemia; may benefit from tube feeding coverage. Consulted and discussed with DM coordinator; would like to augment insulin  coverage prior to transitioning to specialty formula.   Diet Order:   Diet Order             Diet NPO time specified  Diet effective now                   EDUCATION NEEDS:   Not appropriate for education at this time  Skin:  Skin Assessment: Reviewed RN Assessment  Last BM:  05/17/24 (type 6)  Height:   Ht Readings from Last 1 Encounters:  05/11/24 5' 11 (1.803 m)    Weight:   Wt Readings from Last 1 Encounters:  05/18/24 84.9 kg    Ideal Body Weight:  78.2 kg  BMI:  Body mass index is 26.11 kg/m.  Estimated Nutritional Needs:   Kcal:  2300-2500  Protein:  115-130 grams  Fluid:  2.0-2.2 L    Margery ORN, RD, LDN, CDCES Registered Dietitian III Certified Diabetes Care and Education Specialist If unable to reach this RD, please use RD Inpatient group chat on secure chat between hours of 8am-4 pm daily

## 2024-05-18 NOTE — Progress Notes (Addendum)
 Pt was observed several times this am trying to exit bed while NGT & Tele was in progress.  Pt pulled out NGT during prior shift. SN and NT assisted Pt back to bed, repositioned Pt, head elevated 30 degrees.  Pt had to be redirected several times, attempting to pull out IV (L Hand). Hand mitts were initially applied.  Pt continued trying to remove hand mitts. SN contacted MD for Anti-Anxiety meds and Soft Wrist restraints due to Pt's interference with care. MD added orders.  Wrist restraints were applied per protocol and MD order. Pt is scheduled tor have head CT. Anti Anxiety medication to be administered prior to procedure. Pt is stable at this time. Pt's emergency contact, Sister Richardson was notified.

## 2024-05-18 NOTE — Consult Note (Signed)
 " Consultation  Referring Provider:   Hospitalist Admit date: 05/11/2024 Consult date: 05/18/2024         Reason for Consultation: PEG tube placement              HPI:   Juan Harper is a 63 y.o. gentleman with history of cocaine use, BPH, hypertension, and OSA here with a CVA resulting in severe dysphagia. GI consulted for PEG placement. He has dysarthria but denies abdominal surgery or heavy alcohol use. He is on aspirin /plavix  for his CVA with last dose yesterday.  Past Medical History:  Diagnosis Date   Arthritis    NECK/ RIGHT KNEE   Asthma    Bell's palsy    BPH (benign prostatic hyperplasia)    Cough    chronic   Diabetes mellitus without complication (HCC)    Edema    legs/feet   Fusion of spine of cervical region    PLANNED FOR APRIL   HOH (hard of hearing)    Hypertension    Orthopnea    Shortness of breath dyspnea    Sleep apnea    cpap   Wears dentures    partial upper and lower   Wheezing     Past Surgical History:  Procedure Laterality Date   BACK SURGERY     CATARACT EXTRACTION W/PHACO Right 07/31/2016   Procedure: CATARACT EXTRACTION PHACO AND INTRAOCULAR LENS PLACEMENT (IOC);  Surgeon: Adine Oneil Novak, MD;  Location: [facility] ORS;  Service: Ophthalmology;  Laterality: Right;  Lot # [id number] H US :00:26.9 AP%:6.8% CDE: 1.83   CATARACT EXTRACTION W/PHACO Left 06/24/2021   Procedure: CATARACT EXTRACTION PHACO AND INTRAOCULAR LENS PLACEMENT (IOC) LEFT DIABETIC;  Surgeon: Novak Adine Oneil, MD;  Location: [facility];  Service: Ophthalmology;  Laterality: Left;  3.35 0:23.9   COLONOSCOPY     NECK MASS EXCISION     PILONIDAL CYST EXCISION      Family History  Problem Relation Age of Onset   Hypertension Mother     Social History[1]  Prior to Admission medications  Medication Sig Start Date End Date Taking? Authorizing Provider  acetaminophen  (TYLENOL ) 325 MG tablet Take 650 mg by mouth every 6 (six) hours as needed.   Yes [provider]  albuterol  (PROVENTIL  HFA;VENTOLIN  HFA) 108 (90 Base) MCG/ACT inhaler Inhale 2 puffs into the lungs every 6 (six) hours as needed for wheezing or shortness of breath.   Yes [provider]  chlorthalidone (HYGROTON) 25 MG tablet Take 25 mg by mouth daily. 06/18/23  Yes [provider]  dapagliflozin  propanediol (FARXIGA ) 10 MG TABS tablet Take 10 mg by mouth daily. 06/18/23  Yes [provider]  diclofenac sodium (VOLTAREN) 1 % GEL Apply 2 g topically 2 (two) times daily.   Yes [provider]  insulin  aspart (NOVOLOG ) 100 UNIT/ML FlexPen Inject 0-6 Units into the skin 3 (three) times daily before meals using sliding scale. 02/03/23  Yes Mumma, Clotilda, MD  ipratropium (ATROVENT  HFA) 17 MCG/ACT inhaler Inhale 2 puffs into the lungs every 6 (six) hours.   Yes [provider]  lisinopril  (ZESTRIL ) 20 MG tablet Take 40 mg by mouth daily. 04/10/23  Yes [provider]  Melatonin 5 MG CAPS Take 1 capsule by mouth daily.   Yes [provider]  metFORMIN  (GLUCOPHAGE ) 500 MG tablet Take 1,000 mg by mouth 2 (two) times daily with a meal. 06/18/23  Yes [provider]  potassium chloride (KLOR-CON) 10 MEQ tablet Take 10 mEq by  mouth daily. 07/02/22  Yes [provider]  vitamin B-12 (CYANOCOBALAMIN) 1000 MCG tablet Take 1,000 mcg by mouth daily. 08/17/20  Yes [provider]  budesonide-formoterol  (SYMBICORT) 80-4.5 MCG/ACT inhaler Inhale 2 puffs into the lungs 2 (two) times daily.    [provider]  ANNETTA AHLE 2-PACK 1 MG/0.2ML SOAJ  07/31/20   [provider]    Current Facility-Administered Medications  Medication Dose Route Frequency Provider Last Rate Last Admin   acetaminophen  (TYLENOL ) tablet 650 mg  650 mg Per Tube Q6H PRN Niels Kayla FALCON, RPH       Or   acetaminophen  (TYLENOL ) suppository 650 mg  650 mg Rectal Q6H PRN Niels Kayla FALCON, [facility]       amLODipine  (NORVASC ) tablet 10 mg  10 mg  Per Tube Daily Gonfa, Taye T, MD   10 mg at 05/18/24 1058   aspirin  chewable tablet 81 mg  81 mg Per Tube Daily Gonfa, Taye T, MD   81 mg at 05/18/24 1058   atorvastatin  (LIPITOR ) tablet 80 mg  80 mg Per Tube Daily Gonfa, Taye T, MD   80 mg at 05/18/24 1057   feeding supplement (OSMOLITE 1.5 CAL) liquid 1,000 mL  1,000 mL Per Tube Continuous Gonfa, Taye T, MD 60 mL/hr at 05/18/24 0906 Infusion Verify at 05/18/24 0906   feeding supplement (PROSource TF20) liquid 60 mL  60 mL Per Tube BID Kathrin Simmer T, MD   60 mL at 05/18/24 1058   free water  30 mL  30 mL Per Tube Q4H Kathrin Simmer T, MD   30 mL at 05/18/24 0850   heparin  injection 5,000 Units  5,000 Units Subcutaneous Q8H Fernand Prost, MD   5,000 Units at 05/18/24 9375   hydrALAZINE  (APRESOLINE ) tablet 25 mg  25 mg Per Tube Q6H PRN Gonfa, Taye T, MD       insulin  aspart (novoLOG ) injection 0-15 Units  0-15 Units Subcutaneous Q4H Agbata, Tochukwu, MD       insulin  glargine (LANTUS ) injection 10 Units  10 Units Subcutaneous Daily Agbata, Tochukwu, MD   10 Units at 05/18/24 1058   lisinopril  (ZESTRIL ) tablet 40 mg  40 mg Per Tube Daily Gonfa, Taye T, MD   40 mg at 05/18/24 1058   multivitamin with minerals tablet 1 tablet  1 tablet Per Tube Daily Gonfa, Taye T, MD   1 tablet at 05/18/24 1058   ondansetron  (ZOFRAN ) tablet 4 mg  4 mg Per Tube Q6H PRN Niels Kayla FALCON, [facility]       Or   ondansetron  (ZOFRAN ) injection 4 mg  4 mg Intravenous Q6H PRN Greenwood, Howard F, RPH       polyethylene glycol (MIRALAX  / GLYCOLAX ) packet 17 g  17 g Per Tube BID PRN Gonfa, Taye T, MD       senna-docusate (Senokot-S) tablet 1 tablet  1 tablet Per Tube QHS PRN Gonfa, Taye T, MD       sodium chloride  flush (NS) 0.9 % injection 3 mL  3 mL Intravenous Q12H Khan, Ghalib, MD   3 mL at 05/18/24 1038   thiamine  (VITAMIN B1) tablet 100 mg  100 mg Per Tube Daily Gonfa, Taye T, MD   100 mg at 05/18/24 1058    Allergies as of 05/11/2024 - Review Complete 05/11/2024  Allergen  Reaction Noted   Bee venom Itching 10/25/2020     Review of Systems:    All systems reviewed and negative except where noted in HPI.  Gen:  Denies any fever, chills, sweats, anorexia, fatigue, weakness, malaise, weight loss, and sleep disorder CV: Denies chest pain, angina, palpitations, syncope, orthopnea, PND, peripheral edema, and claudication. Resp: Denies dyspnea at rest, dyspnea with exercise, cough, sputum, wheezing, coughing up blood, and pleurisy. GI: Denies vomiting blood, jaundice, and fecal incontinence.   Denies dysphagia or odynophagia. GU : Denies urinary burning, blood in urine, urinary frequency, urinary hesitancy, nocturnal urination, and urinary incontinence. MS: Denies joint pain, limitation of movement, and swelling, stiffness, low back pain, extremity pain. Denies muscle weakness, cramps, atrophy.  Derm: Denies rash, itching, dry skin, hives, moles, warts, or unhealing ulcers.  Psych: Denies depression, anxiety, memory loss, suicidal ideation, hallucinations, paranoia, and confusion. Heme: Denies bruising, bleeding, and enlarged lymph nodes. Neuro:  Denies any headaches, dizziness, paresthesias. Endo:  Denies any problems with DM, thyroid, adrenal function.    Physical Exam:  Vital signs in last 24 hours: Temp:  [98.2 F (36.8 C)-99.1 F (37.3 C)] 98.2 F (36.8 C) (12/24 0754) Pulse Rate:  [84-91] 91 (12/24 0754) Resp:  [16-20] 16 (12/24 0754) BP: (114-162)/(49-76) 162/75 (12/24 0754) SpO2:  [91 %-95 %] 94 % (12/24 0754) Weight:  [84.9 kg] 84.9 kg (12/24 0500) Last BM Date : 05/17/24 General:   In NAD Head:  Normocephalic and atraumatic. Eyes:   No icterus.   Conjunctiva pink. Ears:  Normal auditory acuity. Mouth: Mucosa pink moist, no lesions. Neck:  Supple; no masses felt Lungs:  No respiratory distress. Abdomen:   Flat, soft, nondistended, non-tender Rectal:  Not performed.  Msk:  Symmetrical without gross deformities. Neurologic:  Alert and   oriented x4; Skin:  Warm, dry, pink without significant lesions or rashes. Psych:  Alert and cooperative. Normal affect.  LAB RESULTS: Recent Labs    05/16/24 0651  WBC 5.7  HGB 13.6  HCT 39.9  PLT 297   BMET Recent Labs    05/16/24 0651 05/18/24 0601  NA 138 139  K 4.1 4.6  CL 103 102  CO2 25 27  GLUCOSE 180* 227*  BUN 16 30*  CREATININE 0.80 1.04  CALCIUM  9.2 9.3   LFT Recent Labs    05/16/24 0651  ALBUMIN 3.3*   PT/INR No results for input(s): LABPROT, INR in the last 72 hours.  STUDIES: DG Abd 1 View Result Date: 05/17/2024 CLINICAL DATA:  NG tube placement EXAM: ABDOMEN - 1 VIEW COMPARISON:  05/15/2024 FINDINGS: Enteric tube tip overlies the distal stomach. Enteral contrast within the colon. IMPRESSION: Enteric tube tip overlies the distal stomach. Electronically Signed   By: Luke Bun M.D.   On: 05/17/2024 23:42       Impression / Plan:   63 y.o. gentleman with history of cocaine use, BPH, hypertension, and OSA here with a CVA resulting in severe dysphagia. Consulted for PEG tube placement.  - hold plavix  for 5 days (communicated with nurse and hospitalist). Earliest for procedure would be Sunday - will likely get non-con CT abdomen to make sure an adequate tract is present - will f/u and determine timing of when to be NPO, will re-assess patient Saturday at some time  Please call with any questions or concerns.  Frederic Schick MD, MPH [facility]      [1]  Social History Tobacco Use   Smoking status: Every Day    Current packs/day: 0.50    Average packs/day: 0.5 packs/day for 48.0 years (24.0 ttl pk-yrs)    Types: Cigarettes   Smokeless tobacco: Never   Tobacco comments:  Started age 23  Vaping Use   Vaping status: Never Used  Substance Use Topics   Alcohol use: No   Drug use: Yes    Types: Cocaine, Marijuana    Comment: 06/12/21  Pt states no marajuana for over 1 month.  Has never used cocaine, but does handle it  sometimes.  Not in last month)   "

## 2024-05-18 NOTE — Progress Notes (Signed)
 Patient pull out his NGT. Provider notified and NGT replace

## 2024-05-18 NOTE — Progress Notes (Signed)
 PT Cancellation Note  Patient Details Name: Juan Harper MRN: [id number] DOB: 05/16/1961   Cancelled Treatment:     Pt restless, pulled NGT out last night, attempting to get OOB this am, mitts initially applied, and now currently in wrist restraints for safety. Pt being transferred off floor currently for Head CT. Will re-attempt at a later date per POC.    Darice JAYSON Bohr 05/18/2024, 12:16 PM

## 2024-05-18 NOTE — Progress Notes (Addendum)
 " Progress Note   Patient: Juan Harper FMW:[id number] DOB: 1960-12-18 DOA: 05/11/2024     6 DOS: the patient was seen and examined on 05/18/2024   Brief hospital course:  63 year old M with PMH of DM-2, HTN, HLD and cocaine use brought to ED due to slurred speech and difficulty speaking.  Patient was symptomatic for over 24 hours before arrival to ED   In ED, stable vitals.  Labs without significant finding.  EtOH level negative.  CT head showed acute/subacute stroke in left internal capsule with remote bilateral thalamic strokes.  Neurology consulted.     MRI brain showed acute nonhemorrhagic 10 mm infarct in the lateral aspect of thalamus and posterior limb of left IC, two acute punctate infarct in posterior limb of right IC and remote bilateral cerebral and brainstem CVA.   CT angio head and neck showed moderate to severe bilateral ICA stenosis of the cavernous segments, moderate left A2 stenosis, severe multifocal left P2 and distal right P3 stenosis.,  Focal moderate to severe stenosis involving the proximal left P1 segment and focal severe stenosis involving the distal left V4 segment.  TTE without significant finding   Still with dense dysarthria and severe dysphagia.  NG tube placed and started on TF.  SLP following.  Therapy recommended SNF.   Assessment and Plan:   Acute lacunar infarction:  Presented with slurred speech.   Right facial droop and relative weakness in left leg compared to right persists.   MRI of the brain without contrast shows acute nonhemorrhagic 10mm infarct in the lateral aspect of the thalamus and posterior limb of the left internal capsule. Two acute punctate infarcts in the posterior limb of the right internal capsule. Moderate atrophy and confluent periventricular white matter changes extending to the subcortical regions bilaterally. This most likely reflects the sequelae of chronic microvascular ischemia.Remote lacunar infarcts in the inferior cerebellum  bilaterally and remote ischemic changes extending into the brainstem.  TTE showed a normal LVEF with no evidence of an intra-atrial shunt.  LDL 154.  A1c 8.1%. -Neurology recommends aspirin  and Plavix  for 3 months followed by aspirin  monotherapy.  Discussed with GI who recommends to hold Plavix  for planned PEG tube placement. -Continue Lipitor  80 mg daily -Remains n.p.o. after MBS and meds may be administered crushed.  SLP following. -NGT replaced on 12/21.  Discussed with speech therapy and patient will need a PEG tube for nutrition.  This was also discussed with his sister Jesusa Hof) over the phone who consents for the procedure to be done.  GI consult placed -PT/OT recommended SNF.     NIDDM-2 with hyperglycemia:  A1c 8.1%. Continue sliding scale insulin  Patient was started on Lantus  10 units daily to optimize glycemic control Increase free water  flushes through NG tube Blood sugar checks every 4 hours     Essential hypertension:  Improved blood pressure control. Continue amlodipine  and lisinopril      History of polysubstance use:  Prior UDS positive for cocaine and marijuana     Hypomagnesemia Supplemented     Severe malnutrition Body mass index is 25.27 kg/m. Nutrition Problem: Severe Malnutrition Etiology: social / environmental circumstances Signs/Symptoms: moderate fat depletion, severe fat depletion, moderate muscle depletion, severe muscle depletion Interventions: Tube feeding            Subjective: Dysarthria persists.  Patient able to move all extremities and follow simple commands.  NG tube pulled out overnight and was replaced  Physical Exam: Vitals:   05/18/24 0010 05/18/24 0410  05/18/24 0500 05/18/24 0754  BP: 126/62 129/68  (!) 162/75  Pulse: 86 90  91  Resp: 16 20  16   Temp: 99.1 F (37.3 C) 98.8 F (37.1 C)  98.2 F (36.8 C)  TempSrc: Oral Oral    SpO2: 91% 93%  94%  Weight:   84.9 kg   Height:       GENERAL: No apparent  distress.  Nontoxic.  NG tube in place, dysarthria HEENT: MMM.  Vision and hearing grossly intact.  NECK: Supple.  No apparent JVD.  RESP:  No IWOB.  Fair aeration bilaterally. CVS:  RRR. Heart sounds normal.  ABD/GI/GU: BS+. Abd soft, NTND.  MSK/EXT:  Moves extremities. No apparent deformity. No edema.  SKIN: no apparent skin lesion or wound NEURO: Awake and alert.  Oriented appropriately.  Dysarthria.  Right facial droop.  Seems to have symmetric strength in all extremities.  Patellar reflex symmetric.  No pronator drift.  Patient was not cooperative to assess facial droop PSYCH: Calm. Normal affect.      Data Reviewed: Labs reviewed Labs reviewed  Family Communication: Plan of care discussed with patient's sister over the phone.  All questions and concerns have been addressed.  She verbalizes understanding and agrees with the plan.  Disposition: Status is: Inpatient Remains inpatient appropriate because: Needs PEG tube placement  Planned Discharge Destination: Skilled nursing facility    Time spent: 50 minutes  Author: Aimee Somerset, MD 05/18/2024 11:05 AM  For on call review [email or web address]  "

## 2024-05-19 DIAGNOSIS — E43 Unspecified severe protein-calorie malnutrition: Secondary | ICD-10-CM | POA: Diagnosis not present

## 2024-05-19 DIAGNOSIS — I5032 Chronic diastolic (congestive) heart failure: Secondary | ICD-10-CM | POA: Diagnosis not present

## 2024-05-19 DIAGNOSIS — I10 Essential (primary) hypertension: Secondary | ICD-10-CM | POA: Diagnosis not present

## 2024-05-19 DIAGNOSIS — I639 Cerebral infarction, unspecified: Secondary | ICD-10-CM | POA: Diagnosis not present

## 2024-05-19 LAB — GLUCOSE, CAPILLARY
Glucose-Capillary: 157 mg/dL — ABNORMAL HIGH (ref 70–99)
Glucose-Capillary: 159 mg/dL — ABNORMAL HIGH (ref 70–99)
Glucose-Capillary: 167 mg/dL — ABNORMAL HIGH (ref 70–99)
Glucose-Capillary: 181 mg/dL — ABNORMAL HIGH (ref 70–99)
Glucose-Capillary: 183 mg/dL — ABNORMAL HIGH (ref 70–99)
Glucose-Capillary: 191 mg/dL — ABNORMAL HIGH (ref 70–99)
Glucose-Capillary: 195 mg/dL — ABNORMAL HIGH (ref 70–99)

## 2024-05-19 NOTE — Progress Notes (Signed)
 " Progress Note   Patient: Juan Harper FMW:[id number] DOB: September 17, 1960 DOA: 05/11/2024     7 DOS: the patient was seen and examined on 05/19/2024   Brief hospital course: 63 year old M with PMH of DM-2, HTN, HLD and cocaine use brought to ED due to slurred speech and difficulty speaking. Patient was symptomatic for over 24 hours before arrival to ED  MRI brain showed acute nonhemorrhagic 10 mm infarct in the lateral aspect of thalamus and posterior limb of left IC, two acute punctate infarct in posterior limb of right IC and remote bilateral cerebral and brainstem CVA.   CT angio head and neck showed moderate to severe bilateral ICA stenosis of the cavernous segments, moderate left A2 stenosis, severe multifocal left P2 and distal right P3 stenosis.,  Focal moderate to severe stenosis involving the proximal left P1 segment and focal severe stenosis involving the distal left V4 segment.  TTE without significant finding  Still with dense dysarthria and severe dysphagia. NG tube placed and started on TF. SLP following. Therapy recommended SNF.    Principal Problem:   Cerebral infarction (HCC) Active Problems:   Diabetes (HCC)   HTN (hypertension)   BPH (benign prostatic hyperplasia)   Chronic diastolic CHF (congestive heart failure) (HCC)   Protein-calorie malnutrition, severe   Assessment and Plan: Acute lacunar infarction:  Severe dysphagia. Presents with slurred speech.   Right facial droop and relative weakness in left leg compared to right persists.   MRI of the brain without contrast shows acute nonhemorrhagic 10mm infarct in the lateral aspect of the thalamus and posterior limb of the left internal capsule. Two acute punctate infarcts in the posterior limb of the right internal capsule. Moderate atrophy and confluent periventricular white matter changes extending to the subcortical regions bilaterally. This most likely reflects the sequelae of chronic microvascular ischemia.Remote  lacunar infarcts in the inferior cerebellum bilaterally and remote ischemic changes extending into the brainstem.  TTE showed a normal LVEF with no evidence of an intra-atrial shunt.  LDL 154.  A1c 8.1%. -Neurology recommends aspirin  and Plavix  for 3 months followed by aspirin  monotherapy -Continue Lipitor  80 mg daily -Remains n.p.o. after MBS and meds may be administered crushed.  SLP following. -NGT replaced on 12/21.  Continue tube feed GI consult obtained to place a PEG tube.  Due to Plavix  use, the earliest date for PEG tube is Sunday.  At the meantime, we will continue monitor for improvement of dysphagia by speech therapy     Uncontrolled type 2 diabetes with hyperglycemia:  A1c 8.1%. Continue sliding scale insulin      Essential hypertension:  Improved blood pressure control. Continue amlodipine  and lisinopril      History of polysubstance use:  Prior UDS positive for cocaine marijuana     Hypomagnesemia Supplemented     Severe malnutrition Body mass index is 25.27 kg/m. Nutrition Problem: Severe Malnutrition Etiology: social / environmental circumstances Signs/Symptoms: moderate fat depletion, severe fat depletion, moderate muscle depletion, severe muscle depletion Interventions: Tube feeding        Subjective:  Patient is drowsy today, significant dysphagia  Physical Exam: Vitals:   05/18/24 1912 05/18/24 2316 05/19/24 0400 05/19/24 0815  BP: (!) 103/59 (!) 109/55 121/62 120/75  Pulse: 84 75 78 80  Resp:    16  Temp: 97.6 F (36.4 C) (!) 97.5 F (36.4 C) 98.2 F (36.8 C) 98.3 F (36.8 C)  TempSrc: Oral Oral Oral Oral  SpO2: 98% 96% 90% 91%  Weight:   84.2  kg   Height:       General exam: Appears calm and comfortable  Respiratory system: Clear to auscultation. Respiratory effort normal. Cardiovascular system: S1 & S2 heard, RRR. No JVD, murmurs, rubs, gallops or clicks. No pedal edema. Gastrointestinal system: Abdomen is nondistended, soft and  nontender. No organomegaly or masses felt. Normal bowel sounds heard. Central nervous system: Drowsy and oriented x2.  Speech is slurred. Extremities: Symmetric 5 x 5 power. Skin: No rashes, lesions or ulcers Psychiatry: Flat affect   Data Reviewed:  Reviewed MRI results, CT scan result and lab results.  Family Communication: None  Disposition: Status is: Inpatient Remains inpatient appropriate because: Severity of disease, inpatient procedure, unsafe discharge.     Time spent: 35 minutes  Author: Murvin Mana, MD 05/19/2024 10:21 AM  For on call review [email or web address].    "

## 2024-05-19 NOTE — Hospital Course (Signed)
 62 year old M with PMH of DM-2, HTN, HLD and cocaine use brought to ED due to slurred speech and difficulty speaking. Patient was symptomatic for over 24 hours before arrival to ED  MRI brain showed acute nonhemorrhagic 10 mm infarct in the lateral aspect of thalamus and posterior limb of left IC, two acute punctate infarct in posterior limb of right IC and remote bilateral cerebral and brainstem CVA.   CT angio head and neck showed moderate to severe bilateral ICA stenosis of the cavernous segments, moderate left A2 stenosis, severe multifocal left P2 and distal right P3 stenosis.,  Focal moderate to severe stenosis involving the proximal left P1 segment and focal severe stenosis involving the distal left V4 segment.  TTE without significant finding  Still with dense dysarthria and severe dysphagia. NG tube placed and started on TF. SLP following. Therapy recommended SNF.

## 2024-05-19 NOTE — Plan of Care (Signed)
" °  Problem: Education: Goal: Knowledge of disease or condition will improve Outcome: Progressing Goal: Knowledge of secondary prevention will improve (MUST DOCUMENT ALL) Outcome: Progressing Goal: Knowledge of patient specific risk factors will improve (DELETE if not current risk factor) Outcome: Progressing   Problem: Ischemic Stroke/TIA Tissue Perfusion: Goal: Complications of ischemic stroke/TIA will be minimized Outcome: Progressing   Problem: Coping: Goal: Will verbalize positive feelings about self Outcome: Progressing Goal: Will identify appropriate support needs Outcome: Progressing   Problem: Health Behavior/Discharge Planning: Goal: Ability to manage health-related needs will improve Outcome: Progressing Goal: Goals will be collaboratively established with patient/family Outcome: Progressing   Problem: Self-Care: Goal: Ability to participate in self-care as condition permits will improve Outcome: Progressing Goal: Verbalization of feelings and concerns over difficulty with self-care will improve Outcome: Progressing Goal: Ability to communicate needs accurately will improve Outcome: Progressing   Problem: Nutrition: Goal: Risk of aspiration will decrease Outcome: Progressing Goal: Dietary intake will improve Outcome: Progressing   Problem: Education: Goal: Ability to describe self-care measures that may prevent or decrease complications (Diabetes Survival Skills Education) will improve Outcome: Progressing Goal: Individualized Educational Video(s) Outcome: Progressing   Problem: Coping: Goal: Ability to adjust to condition or change in health will improve Outcome: Progressing   Problem: Fluid Volume: Goal: Ability to maintain a balanced intake and output will improve Outcome: Progressing   Problem: Health Behavior/Discharge Planning: Goal: Ability to identify and utilize available resources and services will improve Outcome: Progressing Goal: Ability to  manage health-related needs will improve Outcome: Progressing   Problem: Metabolic: Goal: Ability to maintain appropriate glucose levels will improve Outcome: Progressing   Problem: Nutritional: Goal: Maintenance of adequate nutrition will improve Outcome: Progressing Goal: Progress toward achieving an optimal weight will improve Outcome: Progressing   Problem: Skin Integrity: Goal: Risk for impaired skin integrity will decrease Outcome: Progressing   Problem: Tissue Perfusion: Goal: Adequacy of tissue perfusion will improve Outcome: Progressing   Problem: Education: Goal: Knowledge of General Education information will improve Description: Including pain rating scale, medication(s)/side effects and non-pharmacologic comfort measures Outcome: Progressing   Problem: Health Behavior/Discharge Planning: Goal: Ability to manage health-related needs will improve Outcome: Progressing   Problem: Clinical Measurements: Goal: Ability to maintain clinical measurements within normal limits will improve Outcome: Progressing Goal: Will remain free from infection Outcome: Progressing Goal: Diagnostic test results will improve Outcome: Progressing Goal: Respiratory complications will improve Outcome: Progressing Goal: Cardiovascular complication will be avoided Outcome: Progressing   Problem: Activity: Goal: Risk for activity intolerance will decrease Outcome: Progressing   Problem: Nutrition: Goal: Adequate nutrition will be maintained Outcome: Progressing   Problem: Coping: Goal: Level of anxiety will decrease Outcome: Progressing   Problem: Elimination: Goal: Will not experience complications related to bowel motility Outcome: Progressing Goal: Will not experience complications related to urinary retention Outcome: Progressing   Problem: Pain Managment: Goal: General experience of comfort will improve and/or be controlled Outcome: Progressing   Problem:  Safety: Goal: Ability to remain free from injury will improve Outcome: Progressing   Problem: Skin Integrity: Goal: Risk for impaired skin integrity will decrease Outcome: Progressing   Problem: Safety: Goal: Non-violent Restraint(s) Outcome: Progressing   "

## 2024-05-20 ENCOUNTER — Inpatient Hospital Stay

## 2024-05-20 DIAGNOSIS — I10 Essential (primary) hypertension: Secondary | ICD-10-CM | POA: Diagnosis not present

## 2024-05-20 DIAGNOSIS — I5032 Chronic diastolic (congestive) heart failure: Secondary | ICD-10-CM | POA: Diagnosis not present

## 2024-05-20 DIAGNOSIS — I639 Cerebral infarction, unspecified: Secondary | ICD-10-CM | POA: Diagnosis not present

## 2024-05-20 LAB — GLUCOSE, CAPILLARY
Glucose-Capillary: 197 mg/dL — ABNORMAL HIGH (ref 70–99)
Glucose-Capillary: 187 mg/dL — ABNORMAL HIGH (ref 70–99)
Glucose-Capillary: 151 mg/dL — ABNORMAL HIGH (ref 70–99)
Glucose-Capillary: 117 mg/dL — ABNORMAL HIGH (ref 70–99)
Glucose-Capillary: 283 mg/dL — ABNORMAL HIGH (ref 70–99)

## 2024-05-20 MED ORDER — HALOPERIDOL LACTATE 5 MG/ML IJ SOLN
2.0000 mg | Freq: Four times a day (QID) | INTRAMUSCULAR | Status: DC | PRN
  Administered 2024-05-20 – 2024-05-23 (×2): 2 mg via INTRAVENOUS
  Filled 2024-05-20: qty 0.4
  Filled 2024-05-20: qty 1

## 2024-05-20 NOTE — Progress Notes (Signed)
 Nutrition Follow-up  DOCUMENTATION CODES:   Severe malnutrition in context of social or environmental circumstances  INTERVENTION:   -TF via NGT:   Osmolite 1.5 @ 60 ml/hr   60 ml Prosource TF20 BID     100 ml free water  flush every 4 hours   Tube feeding regimen provides 2320 kcal (100% of needs), 130 grams of protein, and 1097 ml of H2O. Total free water : 1697 ml daily   -Continue MVI with minerals daily via tube  NUTRITION DIAGNOSIS:   Severe Malnutrition related to social / environmental circumstances as evidenced by moderate fat depletion, severe fat depletion, moderate muscle depletion, severe muscle depletion.  Ongoing  GOAL:   Patient will meet greater than or equal to 90% of their needs  Met with TF  MONITOR:   TF tolerance  REASON FOR ASSESSMENT:   Consult Assessment of nutrition requirement/status  ASSESSMENT:   63 y.o. year old male with medical history of HTN, HLD, DMII presenting after having worsening slurred speech.  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   Patient sitting up in bed at time of visit. He is more alert and interactive in comparison to prior visit. He was able to tell this RD that he is in the hospital and wants  to go home. RD discussed reason for being in the hospital, including inability to eat by mouth secondary to dysphagia. RD discussed plan for PEG tube, which patient agreed to; discussed importance of maintaining NGT until permanent feeding access can be obtained.   Osmolite 1.5 infusing at goal rate of 60 ml/hr. Patient tolerating well. He denies any nausea, vomiting, or abdominal pain.   Patient NPO per MBSS on 05/16/24; SLP anticipates long term recovery for dysphagia. 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   Per GI notes, plavix  will need to be held for 5 days prior to procedure. Earlier available date for procedure would be on 05/22/24.  Reviewed weight history. Weight has ranged from 82.2-85.1 kg since admission.   Per TOC notes, plan for SNF placement at discharge (patient has a bed offer [facility]).   Medications reviewed.   Labs reviewed: CBGS: 157-197 (inpatient orders for glycemic control are 0-15 units insulin  aspart every 4 hours and 10 units insulin  glargine daily). Case discussed with DM coordinator; prior hyperglycemia likely related to missed insulin  dosing due to recent insulin  order changes.   Diet Order:   Diet Order             Diet NPO time specified  Diet effective now                   EDUCATION NEEDS:   Not appropriate  for education at this time  Skin:  Skin Assessment: Reviewed RN Assessment  Last BM:  05/19/24  Height:   Ht Readings from Last 1 Encounters:  05/11/24 5' 11 (1.803 m)    Weight:   Wt Readings from Last 1 Encounters:  05/20/24 84.4 kg    Ideal Body Weight:  78.2 kg  BMI:  Body mass index is 25.95 kg/m.  Estimated Nutritional Needs:   Kcal:  2300-2500  Protein:  115-130 grams  Fluid:  2.0-2.2 L    Margery ORN, RD, LDN, CDCES Registered Dietitian III Certified Diabetes Care and Education Specialist If unable to reach this RD, please use RD Inpatient group chat on  secure chat between hours of 8am-4 pm daily

## 2024-05-20 NOTE — Plan of Care (Signed)
   Problem: Activity: Goal: Risk for activity intolerance will decrease Outcome: Progressing   Problem: Nutrition: Goal: Adequate nutrition will be maintained Outcome: Progressing   Problem: Pain Managment: Goal: General experience of comfort will improve and/or be controlled Outcome: Progressing   Problem: Safety: Goal: Ability to remain free from injury will improve Outcome: Progressing

## 2024-05-20 NOTE — Progress Notes (Addendum)
 Occupational Therapy Treatment Patient Details Name: Juan Harper MRN: [id number] DOB: 12-Nov-1960 Today's Date: 05/20/2024   History of present illness Pt is a 63 y.o. year old male presenting to the ED on 05/11/24 after having worsening slurred speech, weakness, confusion and falls. 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 PMH of HTN, HLD, DMII   OT comments  Pt is seated in recliner on arrival, falling asleep and both feet on floor. Easily arousable and agreeable to OT session. He denies pain. Pt performed STS from recliner with verb cues for hand placement and Min/mod A to reach upright position, once in standing able to ambulate ~10-15 ft using RW with Min A overall for RW management assist and obstacle maneuvering. Pt declined further activity, wished to return to bed. Able to perform seated grooming tasks using R dominant hand during session. Still noted with mild R lateral lean and cues for upright posture. Pt returned to bed with all needs in place and will cont to require skilled acute OT services to maximize his safety and IND to return to PLOF. Based on improvement, upgraded DC recommendation to CIR.       If plan is discharge home, recommend the following:  A lot of help with bathing/dressing/bathroom;Assist for transportation;Assistance with cooking/housework;Help with stairs or ramp for entrance;Direct supervision/assist for medications management;A lot of help with walking and/or transfers;A little help with walking and/or transfers;A little help with bathing/dressing/bathroom   Equipment Recommendations  Other (comment) (defer to next venue)    Recommendations for Other Services      Precautions / Restrictions Precautions Precautions: Fall Recall of Precautions/Restrictions:  Impaired Precaution/Restrictions Comments: NGT for feeding Restrictions Weight Bearing Restrictions Per Provider Order: No       Mobility Bed Mobility Overal bed mobility: Needs Assistance Bed Mobility: Sit to Supine       Sit to supine: Min assist   General bed mobility comments: BLE management to return to supine, able to scoot to [facility] using headboard rails and cues    Transfers Overall transfer level: Needs assistance Equipment used: Rolling walker (2 wheels) Transfers: Sit to/from Stand Sit to Stand: Min assist, Mod assist           General transfer comment: stands from recliner with cues for hand placement, ambulates ~10-15 ft using RW with Min A for RW management assist     Balance Overall balance assessment: Needs assistance Sitting-balance support: Feet supported Sitting balance-Leahy Scale: Fair     Standing balance support: Bilateral upper extremity supported, Reliant on assistive device for balance, During functional activity Standing balance-Leahy Scale: Poor Standing balance comment: BUE support on RW and +1 assist to maintain balance during ambulation                           ADL either performed or assessed with clinical judgement   ADL Overall ADL's : Needs assistance/impaired     Grooming: Wash/dry face;Set up;Sitting Grooming Details (indicate cue type and reason): using R dominant hand to wipe drool multiple times and use suction                             Functional mobility during ADLs: Minimal assistance;Cueing for safety;Cueing for sequencing;Rolling walker (2 wheels) General  ADL Comments: STS and short distance ambulation within the room using RW with RW management assist/obstacle maneuvering    Extremity/Trunk Assessment              Vision       Perception     Praxis     Communication Communication Communication: Impaired Factors Affecting Communication: Difficulty expressing self;Reduced clarity  of speech   Cognition Arousal: Alert Behavior During Therapy: Flat affect                                 Following commands: Impaired Following commands impaired: Follows one step commands inconsistently      Cueing   Cueing Techniques: Verbal cues, Tactile cues  Exercises      Shoulder Instructions       General Comments NGT intact pre/post session    Pertinent Vitals/ Pain       Pain Assessment Pain Assessment: No/denies pain Pain Intervention(s): Monitored during session, Repositioned  Home Living                                          Prior Functioning/Environment              Frequency  Min 2X/week        Progress Toward Goals  OT Goals(current goals can now be found in the care plan section)  Progress towards OT goals: Progressing toward goals  Acute Rehab OT Goals Patient Stated Goal: get better OT Goal Formulation: With patient Time For Goal Achievement: 05/26/24 Potential to Achieve Goals: Fair  Plan      Co-evaluation                 AM-PAC OT 6 Clicks Daily Activity     Outcome Measure   Help from another person eating meals?: A Little Help from another person taking care of personal grooming?: A Little Help from another person toileting, which includes using toliet, bedpan, or urinal?: A Lot Help from another person bathing (including washing, rinsing, drying)?: A Lot Help from another person to put on and taking off regular upper body clothing?: A Little Help from another person to put on and taking off regular lower body clothing?: A Lot 6 Click Score: 15    End of Session Equipment Utilized During Treatment: Gait belt;Rolling walker (2 wheels)  OT Visit Diagnosis: Other abnormalities of gait and mobility (R26.89);Muscle weakness (generalized) (M62.81);Unsteadiness on feet (R26.81)   Activity Tolerance Patient tolerated treatment well   Patient Left in bed;with call bell/phone within  reach;with bed alarm set;with nursing/sitter in room   Nurse Communication Mobility status        Time: 8543-8487 OT Time Calculation (min): 16 min  Charges: OT General Charges $OT Visit: 1 Visit OT Treatments $Therapeutic Activity: 8-22 mins  Kirtan Sada Chrismon, OTR/L  05/20/2024, 3:31 PM   Priscillia Fouch E Chrismon 05/20/2024, 3:29 PM

## 2024-05-20 NOTE — Progress Notes (Signed)
" °  Inpatient Rehab Admissions Coordinator :  Per therapy change in recommendations, patient was screened for CIR candidacy by Heron Leavell RN MSN.  At this time patient appears to be a potential candidate for CIR. He will need 24/7 caregiver supports after any short term rehab. Noted had accepted SNF bed and son is contact. I will place a rehab consult per protocol for full assessment.   Heron Leavell RN MSN Admissions Coordinator [phone]   "

## 2024-05-20 NOTE — Progress Notes (Signed)
 Physical Therapy Treatment Patient Details Name: Juan Harper MRN: [id number] DOB: [birth year] Today's Date: 05/20/2024   History of Present Illness Pt is a 63 y.o. year old male presenting to the ED on 05/11/24 after having worsening slurred speech, weakness, confusion and falls. 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 PMH of HTN, HLD, DMII    PT Comments  Pt was A and O x 3. Agrees to session and remains cooperative throughout. Struggles with expressive aphasia however was able to consistently;y follow simple commands with increased time. Pt was able to roll R to short sit with increased time + mod assist. Stood EOB to RW 3 x. One occasion of incontinence of urine on 2nd STS however proceeded to ambulate with RW to doorway of room afterwards. Pt puts forth great effort and does seem to tolerate session well. He remains far from his baseline abilities and will benefit from continued skilled PT to maximize his safety with all ADLs.    If plan is discharge home, recommend the following: A little help with walking and/or transfers;A little help with bathing/dressing/bathroom     Equipment Recommendations  None recommended by PT       Precautions / Restrictions Precautions Precautions: Fall Recall of Precautions/Restrictions: Impaired Precaution/Restrictions Comments: NGT for feeding Restrictions Weight Bearing Restrictions Per Provider Order: No     Mobility  Bed Mobility Overal bed mobility: Needs Assistance Bed Mobility: Supine to Sit, Sit to Supine  Supine to sit: Mod assist, Used rails  General bed mobility comments: Mod assist to exit R side of bed with increased time and vcs for sequencing and safety. Step by step vcs for sequencing improvements    Transfers Overall transfer level: Needs  assistance Equipment used: Rolling walker (2 wheels) Transfers: Sit to/from Stand Sit to Stand: Min assist  General transfer comment: Pt was able to stand EOB 3 x . 2nd attempt had incontinence of urine episode. Required max assist for hygiene care and bed linen gown change.    Ambulation/Gait Ambulation/Gait assistance: Contact guard assist Gait Distance (Feet): 10 Feet Assistive device: Rolling walker (2 wheels) Gait Pattern/deviations: Decreased stride length, Trunk flexed, Narrow base of support Gait velocity: decreased  General Gait Details: Pt tolerated ambulation from EOB to doorway of room. Unwilling to ambulate greater distances.   Balance Overall balance assessment: Needs assistance Sitting-balance support: Feet supported Sitting balance-Leahy Scale: Poor     Standing balance support: Bilateral upper extremity supported, During functional activity, Reliant on assistive device for balance Standing balance-Leahy Scale: Poor Standing balance comment: far from baseline balance     Communication Communication Communication: Impaired Factors Affecting Communication: Difficulty expressing self;Reduced clarity of speech  Cognition Arousal: Alert Behavior During Therapy: Flat affect   PT - Cognitive impairments: No family/caregiver present to determine baseline      PT - Cognition Comments: Pt is alert and able to answer questions however only truely oriented x 2. Pt has flat affect and poor historian of prior medical history Following commands: Impaired Following commands impaired: Follows one step commands inconsistently    Cueing Cueing Techniques: Verbal cues, Tactile cues     General Comments General comments (skin integrity, edema, etc.): NGT intact pre/post session      Pertinent Vitals/Pain Pain Assessment Pain Assessment: No/denies pain     PT Goals (current goals can  now be found in the care plan section) Acute Rehab PT Goals Patient Stated Goal: none  stated Progress towards PT goals: Progressing toward goals    Frequency    Min 2X/week       AM-PAC PT 6 Clicks Mobility   Outcome Measure  Help needed turning from your back to your side while in a flat bed without using bedrails?: A Lot Help needed moving from lying on your back to sitting on the side of a flat bed without using bedrails?: A Lot Help needed moving to and from a bed to a chair (including a wheelchair)?: A Lot Help needed standing up from a chair using your arms (e.g., wheelchair or bedside chair)?: A Lot Help needed to walk in hospital room?: A Lot Help needed climbing 3-5 steps with a railing? : A Lot 6 Click Score: 12    End of Session Equipment Utilized During Treatment: Gait belt Activity Tolerance: Patient tolerated treatment well Patient left: in chair;with call bell/phone within reach;with chair alarm set Nurse Communication: Mobility status PT Visit Diagnosis: Unsteadiness on feet (R26.81);Other abnormalities of gait and mobility (R26.89);Muscle weakness (generalized) (M62.81);Difficulty in walking, not elsewhere classified (R26.2)     Time: 1340-1404 PT Time Calculation (min) (ACUTE ONLY): 24 min  Charges:    $Gait Training: 8-22 mins $Therapeutic Activity: 8-22 mins PT General Charges $$ ACUTE PT VISIT: 1 Visit                     Rankin Essex PTA 05/20/2024, 3:43 PM

## 2024-05-20 NOTE — Plan of Care (Signed)
" °  Problem: Education: Goal: Knowledge of disease or condition will improve Outcome: Progressing   Problem: Ischemic Stroke/TIA Tissue Perfusion: Goal: Complications of ischemic stroke/TIA will be minimized Outcome: Progressing   Problem: Coping: Goal: Will verbalize positive feelings about self Outcome: Progressing   Problem: Nutrition: Goal: Risk of aspiration will decrease Outcome: Progressing   Problem: Education: Goal: Ability to describe self-care measures that may prevent or decrease complications (Diabetes Survival Skills Education) will improve Outcome: Progressing   Problem: Coping: Goal: Ability to adjust to condition or change in health will improve Outcome: Progressing   Problem: Fluid Volume: Goal: Ability to maintain a balanced intake and output will improve Outcome: Progressing   Problem: Health Behavior/Discharge Planning: Goal: Ability to identify and utilize available resources and services will improve Outcome: Progressing   Problem: Metabolic: Goal: Ability to maintain appropriate glucose levels will improve Outcome: Progressing   Problem: Nutritional: Goal: Maintenance of adequate nutrition will improve Outcome: Progressing   Problem: Tissue Perfusion: Goal: Adequacy of tissue perfusion will improve Outcome: Progressing   Problem: Education: Goal: Knowledge of General Education information will improve Description: Including pain rating scale, medication(s)/side effects and non-pharmacologic comfort measures Outcome: Progressing   "

## 2024-05-20 NOTE — Progress Notes (Signed)
 " Progress Note   Patient: Juan Harper FMW:[id number] DOB: 1961-02-07 DOA: 05/11/2024     8 DOS: the patient was seen and examined on 05/20/2024   Brief hospital course: 63 year old M with PMH of DM-2, HTN, HLD and cocaine use brought to ED due to slurred speech and difficulty speaking. Patient was symptomatic for over 24 hours before arrival to ED  MRI brain showed acute nonhemorrhagic 10 mm infarct in the lateral aspect of thalamus and posterior limb of left IC, two acute punctate infarct in posterior limb of right IC and remote bilateral cerebral and brainstem CVA.   CT angio head and neck showed moderate to severe bilateral ICA stenosis of the cavernous segments, moderate left A2 stenosis, severe multifocal left P2 and distal right P3 stenosis.,  Focal moderate to severe stenosis involving the proximal left P1 segment and focal severe stenosis involving the distal left V4 segment.  TTE without significant finding  Still with dense dysarthria and severe dysphagia. NG tube placed and started on TF. SLP following. Therapy recommended SNF.    Principal Problem:   Cerebral infarction (HCC) Active Problems:   Diabetes (HCC)   HTN (hypertension)   BPH (benign prostatic hyperplasia)   Chronic diastolic CHF (congestive heart failure) (HCC)   Protein-calorie malnutrition, severe   Assessment and Plan:  Acute lacunar infarction:  Severe dysphagia. Presents with slurred speech.   Right facial droop and relative weakness in left leg compared to right persists.   MRI of the brain without contrast shows acute nonhemorrhagic 10mm infarct in the lateral aspect of the thalamus and posterior limb of the left internal capsule. Two acute punctate infarcts in the posterior limb of the right internal capsule. Moderate atrophy and confluent periventricular white matter changes extending to the subcortical regions bilaterally. This most likely reflects the sequelae of chronic microvascular ischemia.Remote  lacunar infarcts in the inferior cerebellum bilaterally and remote ischemic changes extending into the brainstem.  TTE showed a normal LVEF with no evidence of an intra-atrial shunt.  LDL 154.  A1c 8.1%. -Neurology recommends aspirin  and Plavix  for 3 months followed by aspirin  monotherapy -Continue Lipitor  80 mg daily -Remains n.p.o. after MBS and meds may be administered crushed.  SLP following. -NGT replaced on 12/21.  Continue tube feed GI consult obtained to place a PEG tube.  Due to Plavix  use, the earliest date for PEG tube is Sunday.  At the meantime, we will continue monitor for improvement of dysphagia by speech therapy No change in treatment today.     Uncontrolled type 2 diabetes with hyperglycemia:  A1c 8.1%. Continue sliding scale insulin      Essential hypertension:  Improved blood pressure control. Continue amlodipine  and lisinopril      History of polysubstance use:  Prior UDS positive for cocaine marijuana     Hypomagnesemia Recheck labs tomorrow.     Severe malnutrition Body mass index is 25.27 kg/m. Nutrition Problem: Severe Malnutrition Etiology: social / environmental circumstances Signs/Symptoms: moderate fat depletion, severe fat depletion, moderate muscle depletion, severe muscle depletion Interventions: Tube feeding        Subjective:  Patient doing well today, no short of breath.  Minimal cough.  Physical Exam: Vitals:   05/19/24 2343 05/20/24 0407 05/20/24 0414 05/20/24 0733  BP: 116/65 (!) 142/76  125/65  Pulse: 86 96  92  Resp: 20 20  16   Temp: 98.3 F (36.8 C) 98.6 F (37 C)  99.4 F (37.4 C)  TempSrc: Oral Oral  Oral  SpO2: 94%  91%  92%  Weight:   84.4 kg   Height:       General exam: Appears calm and comfortable  Respiratory system: Clear to auscultation. Respiratory effort normal. Cardiovascular system: S1 & S2 heard, RRR. No JVD, murmurs, rubs, gallops or clicks. No pedal edema. Gastrointestinal system: Abdomen is  nondistended, soft and nontender. No organomegaly or masses felt. Normal bowel sounds heard. Central nervous system: Alert and oriented x2, dysarthria. Extremities: Symmetric 5 x 5 power. Skin: No rashes, lesions or ulcers Psychiatry: Mood & affect appropriate.    Data Reviewed:  Lab results reviewed.  Family Communication: Not able to reach sister  Disposition: Status is: Inpatient Remains inpatient appropriate because: Safe discharge, pending nursing home placement     Time spent: 35 minutes  Author: Murvin Mana, MD 05/20/2024 10:50 AM  For on call review [email or web address].    "

## 2024-05-21 DIAGNOSIS — I5032 Chronic diastolic (congestive) heart failure: Secondary | ICD-10-CM | POA: Diagnosis not present

## 2024-05-21 DIAGNOSIS — I10 Essential (primary) hypertension: Secondary | ICD-10-CM | POA: Diagnosis not present

## 2024-05-21 DIAGNOSIS — I639 Cerebral infarction, unspecified: Secondary | ICD-10-CM | POA: Diagnosis not present

## 2024-05-21 LAB — GLUCOSE, CAPILLARY
Glucose-Capillary: 184 mg/dL — ABNORMAL HIGH (ref 70–99)
Glucose-Capillary: 90 mg/dL (ref 70–99)
Glucose-Capillary: 137 mg/dL — ABNORMAL HIGH (ref 70–99)
Glucose-Capillary: 182 mg/dL — ABNORMAL HIGH (ref 70–99)
Glucose-Capillary: 174 mg/dL — ABNORMAL HIGH (ref 70–99)
Glucose-Capillary: 207 mg/dL — ABNORMAL HIGH (ref 70–99)
Glucose-Capillary: 239 mg/dL — ABNORMAL HIGH (ref 70–99)

## 2024-05-21 LAB — BASIC METABOLIC PANEL WITH GFR
Anion gap: 11 (ref 5–15)
BUN: 40 mg/dL — ABNORMAL HIGH (ref 8–23)
CO2: 27 mmol/L (ref 22–32)
Calcium: 9.5 mg/dL (ref 8.9–10.3)
Chloride: 100 mmol/L (ref 98–111)
Creatinine, Ser: 0.98 mg/dL (ref 0.61–1.24)
GFR, Estimated: >60 mL/min
Glucose, Bld: 147 mg/dL — ABNORMAL HIGH (ref 70–99)
Potassium: 5.1 mmol/L (ref 3.5–5.1)
Sodium: 137 mmol/L (ref 135–145)

## 2024-05-21 LAB — CBC
HCT: 41.5 % (ref 39.0–52.0)
Hemoglobin: 13.5 g/dL (ref 13.0–17.0)
MCH: 28.2 pg (ref 26.0–34.0)
MCHC: 32.5 g/dL (ref 30.0–36.0)
MCV: 86.8 fL (ref 80.0–100.0)
Platelets: 297 K/uL (ref 150–400)
RBC: 4.78 MIL/uL (ref 4.22–5.81)
RDW: 12.9 % (ref 11.5–15.5)
WBC: 7.4 K/uL (ref 4.0–10.5)
nRBC: 0 % (ref 0.0–0.2)

## 2024-05-21 LAB — MAGNESIUM: Magnesium: 2.2 mg/dL (ref 1.7–2.4)

## 2024-05-21 NOTE — Plan of Care (Signed)
" °  Problem: Education: Goal: Knowledge of disease or condition will improve 05/21/2024 1829 by Kateri Caprice HERO, RN Outcome: Progressing 05/21/2024 1737 by Kateri Caprice HERO, RN Outcome: Progressing   Problem: Ischemic Stroke/TIA Tissue Perfusion: Goal: Complications of ischemic stroke/TIA will be minimized 05/21/2024 1829 by Kateri Caprice HERO, RN Outcome: Progressing 05/21/2024 1737 by Kateri Caprice HERO, RN Outcome: Progressing   Problem: Coping: Goal: Will verbalize positive feelings about self 05/21/2024 1829 by Kateri Caprice HERO, RN Outcome: Progressing 05/21/2024 1737 by Kateri Caprice HERO, RN Outcome: Progressing   Problem: Health Behavior/Discharge Planning: Goal: Ability to manage health-related needs will improve 05/21/2024 1829 by Kateri Caprice HERO, RN Outcome: Progressing 05/21/2024 1737 by Kateri Caprice HERO, RN Outcome: Progressing   Problem: Self-Care: Goal: Ability to participate in self-care as condition permits will improve 05/21/2024 1829 by Kateri Caprice HERO, RN Outcome: Progressing 05/21/2024 1737 by Kateri Caprice HERO, RN Outcome: Progressing   Problem: Nutrition: Goal: Risk of aspiration will decrease Outcome: Progressing   Problem: Education: Goal: Ability to describe self-care measures that may prevent or decrease complications (Diabetes Survival Skills Education) will improve 05/21/2024 1829 by Kateri Caprice HERO, RN Outcome: Progressing 05/21/2024 1737 by Kateri Caprice HERO, RN Outcome: Progressing   Problem: Coping: Goal: Ability to adjust to condition or change in health will improve Outcome: Progressing   Problem: Fluid Volume: Goal: Ability to maintain a balanced intake and output will improve 05/21/2024 1829 by Kateri Caprice HERO, RN Outcome: Progressing 05/21/2024 1737 by Kateri Caprice HERO, RN Outcome: Progressing   Problem: Health Behavior/Discharge Planning: Goal: Ability to identify and utilize available resources and services will improve Outcome: Progressing    Problem: Metabolic: Goal: Ability to maintain appropriate glucose levels will improve 05/21/2024 1829 by Kateri Caprice HERO, RN Outcome: Progressing 05/21/2024 1737 by Kateri Caprice HERO, RN Outcome: Progressing   Problem: Nutritional: Goal: Maintenance of adequate nutrition will improve Outcome: Progressing   Problem: Tissue Perfusion: Goal: Adequacy of tissue perfusion will improve Outcome: Progressing   Problem: Education: Goal: Knowledge of General Education information will improve Description: Including pain rating scale, medication(s)/side effects and non-pharmacologic comfort measures Outcome: Progressing   "

## 2024-05-21 NOTE — Plan of Care (Signed)
" °  Problem: Education: Goal: Knowledge of disease or condition will improve Outcome: Progressing   Problem: Ischemic Stroke/TIA Tissue Perfusion: Goal: Complications of ischemic stroke/TIA will be minimized Outcome: Progressing   Problem: Coping: Goal: Will verbalize positive feelings about self Outcome: Progressing   Problem: Health Behavior/Discharge Planning: Goal: Ability to manage health-related needs will improve Outcome: Progressing   Problem: Self-Care: Goal: Ability to participate in self-care as condition permits will improve Outcome: Progressing   Problem: Education: Goal: Ability to describe self-care measures that may prevent or decrease complications (Diabetes Survival Skills Education) will improve Outcome: Progressing   Problem: Fluid Volume: Goal: Ability to maintain a balanced intake and output will improve Outcome: Progressing   Problem: Health Behavior/Discharge Planning: Goal: Ability to identify and utilize available resources and services will improve Outcome: Progressing   Problem: Metabolic: Goal: Ability to maintain appropriate glucose levels will improve Outcome: Progressing   "

## 2024-05-21 NOTE — Progress Notes (Signed)
 Speech Language Pathology Treatment: Dysphagia  Patient Details Name: Juan Harper MRN: [id number] DOB: 1961/04/24 Today's Date: 05/21/2024 Time: 8654-8584 SLP Time Calculation (min) (ACUTE ONLY): 30 min  Assessment / Plan / Recommendation Clinical Impression  Pt seen for dysphagia intervention. Pt with intermittent lethargy noted t/o session, requiring tactile/verbal cues for engagement. No updated chest imaging- pt continues on room air, afebrile, with O2 saturations maintained in the low 90s (89-94) for duration of session. Trials completed of ice chips with total assist. Verbal/visual cues provided for implementation of effortful swallow- though no overt increase in effort noted. Cough present during trials and in the absence of PO- extended time/oral suction provided in the setting of cough. Wet vocal quality and suspected delayed swallow initiation noted. Oral manipulation remains prolonged, with pt intermittently closing eyes/drifting to sleep with PO in mouth. Recommend continued NPO with SLP to follow for therapeutic trials. Frequent oral care. RN aware of recommendations. Pt to benefit from follow up at next level of care.    HPI HPI: Per H&P, Juan Harper is a 63 y.o. year old male with medical history of HTN, HLD, DMII,  presenting to the ED after having worsening slurred speech. MRI: 1. Acute nonhemorrhagic 10mm infarct in the lateral aspect of the thalamus and  posterior limb of the left internal capsule.  2. Two acute punctate infarcts in the posterior limb of the right internal  capsule.  3. Moderate atrophy and confluent periventricular white matter changes  extending to the subcortical regions bilaterally. This most likely reflects the  sequelae of chronic microvascular ischemia.  4. Remote lacunar infarcts in the inferior cerebellum bilaterally and remote  ischemic changes extending into the brainstem.   Patient was symptomatic for over 24 hours before arrival to ED.  Family is  concerned about his living situation.  Toxicology report indicating positive for cocaine.      SLP Plan  Continue with current plan of care        Swallow Evaluation Recommendations   Recommendations: NPO;Alternative means of nutrition - G Tube Oral care recommendations: Oral care QID (4x/day);Staff/trained caregiver to provide oral care     Recommendations                     Oral care QID;Staff/trained caregiver to provide oral care   Frequent or constant Supervision/Assistance Dysphagia, oropharyngeal phase (R13.12)     Continue with current plan of care    Lenora Gomes Clapp, MS, CCC-SLP Speech Language Pathologist Rehab Services; [facility] [phone] (ascom)   [name]  05/21/2024, 3:00 PM

## 2024-05-21 NOTE — Care Plan (Signed)
 Attempted to speak with Juan Harper but he was falling asleep during discussion. Attempted to call his sone but no answer. Will tentatively add him on for a PEG tube tomorrow morning but will need to get consent before the procedure. Will try again in the morning.  - please hold tube feeds at midnight - dc'ed heparin  - will need to get antibiotics prior to procedure, will order them in the morning. - further recs after PEG placement  Frederic Schick MD, MPH [facility]

## 2024-05-21 NOTE — Progress Notes (Signed)
 Inpatient Rehab Admissions:  Inpatient Rehab Consult received.  I spoke with patient's sons on the telephone for rehabilitation assessment and to discuss goals and expectations of an inpatient rehab admission.  Deon acknowledged understanding of CIR goals and expectations. Family would like to continue with d/c plan to SNF in [place]. TOC made aware. AC will sign off.  Signed: Tinnie Yvone Cohens, MS, CCC-SLP Admissions Coordinator [phone]

## 2024-05-21 NOTE — Progress Notes (Signed)
 " Progress Note   Patient: Juan Harper FMW:[id number] DOB: 10-07-1960 DOA: 05/11/2024     9 DOS: the patient was seen and examined on 05/21/2024   Brief hospital course: 63 year old M with PMH of DM-2, HTN, HLD and cocaine use brought to ED due to slurred speech and difficulty speaking. Patient was symptomatic for over 24 hours before arrival to ED  MRI brain showed acute nonhemorrhagic 10 mm infarct in the lateral aspect of thalamus and posterior limb of left IC, two acute punctate infarct in posterior limb of right IC and remote bilateral cerebral and brainstem CVA.   CT angio head and neck showed moderate to severe bilateral ICA stenosis of the cavernous segments, moderate left A2 stenosis, severe multifocal left P2 and distal right P3 stenosis.,  Focal moderate to severe stenosis involving the proximal left P1 segment and focal severe stenosis involving the distal left V4 segment.  TTE without significant finding  Still with dense dysarthria and severe dysphagia. NG tube placed and started on TF. SLP following. Therapy recommended SNF.    Principal Problem:   Cerebral infarction (HCC) Active Problems:   Diabetes (HCC)   HTN (hypertension)   BPH (benign prostatic hyperplasia)   Chronic diastolic CHF (congestive heart failure) (HCC)   Protein-calorie malnutrition, severe   Assessment and Plan:  Acute lacunar infarction:  Severe dysphagia. Presents with slurred speech.   Right facial droop and relative weakness in left leg compared to right persists.   MRI of the brain without contrast shows acute nonhemorrhagic 10mm infarct in the lateral aspect of the thalamus and posterior limb of the left internal capsule. Two acute punctate infarcts in the posterior limb of the right internal capsule. Moderate atrophy and confluent periventricular white matter changes extending to the subcortical regions bilaterally. This most likely reflects the sequelae of chronic microvascular ischemia.Remote  lacunar infarcts in the inferior cerebellum bilaterally and remote ischemic changes extending into the brainstem.  TTE showed a normal LVEF with no evidence of an intra-atrial shunt.  LDL 154.  A1c 8.1%. -Neurology recommends aspirin  and Plavix  for 3 months followed by aspirin  monotherapy -Continue Lipitor  80 mg daily -Remains n.p.o. after MBS and meds may be administered crushed.  SLP following. -NGT replaced on 12/21.  Continue tube feed GI consult obtained to place a PEG tube.  Due to Plavix  use, the earliest date for PEG tube is Sunday.  At the meantime, we will continue monitor for improvement of dysphagia by speech therapy Patient accidentally removed NG tube yesterday, replaced again.  Tube feeding restarted.  Patient tolerating well.     Uncontrolled type 2 diabetes with hyperglycemia:  A1c 8.1%. Continue sliding scale insulin , glucose not significant elevated today.     Essential hypertension:  Improved blood pressure control. Continue amlodipine  and lisinopril      History of polysubstance use:  Prior UDS positive for cocaine marijuana     Hypomagnesemia Recheck labs tomorrow.     Severe malnutrition Body mass index is 25.27 kg/m. Nutrition Problem: Severe Malnutrition Etiology: social / environmental circumstances Signs/Symptoms: moderate fat depletion, severe fat depletion, moderate muscle depletion, severe muscle depletion Interventions: Tube feeding           Subjective:  Patient has slurred speech, does not have any short of breath.  Physical Exam: Vitals:   05/20/24 1935 05/21/24 0008 05/21/24 0408 05/21/24 0730  BP: (!) 143/72 (!) 145/85 (!) 148/73 (!) 141/72  Pulse: 94 92 90 89  Resp: 16 16 16 20   Temp:  98.2 F (36.8 C) 98.4 F (36.9 C) 98.6 F (37 C) 98.8 F (37.1 C)  TempSrc:  Oral  Oral  SpO2: 91% 93% 97% 91%  Weight:      Height:       General exam: Appears calm and comfortable  Respiratory system: Clear to auscultation. Respiratory  effort normal. Cardiovascular system: S1 & S2 heard, RRR. No JVD, murmurs, rubs, gallops or clicks. No pedal edema. Gastrointestinal system: Abdomen is nondistended, soft and nontender. No organomegaly or masses felt. Normal bowel sounds heard. Central nervous system: Alert and oriented x2.  Slurred speech Extremities: Symmetric 5 x 5 power. Skin: No rashes, lesions or ulcers Psychiatry: Flat affect.   Data Reviewed:  There are no new results to review at this time.  Family Communication: sister updated over phone  Disposition: Status is: Inpatient Remains inpatient appropriate because: Unsafe discharge, pending nursing placement.  Also pending inpatient procedure     Time spent: 35 minutes  Author: Murvin Mana, MD 05/21/2024 12:07 PM  For on call review [email or web address].    "

## 2024-05-22 ENCOUNTER — Encounter
Admission: EM | Disposition: A | Discharge status: Skilled Nursing Facility | Payer: Self-pay | Source: Home / Self Care | Attending: Student

## 2024-05-22 ENCOUNTER — Inpatient Hospital Stay: Admitting: General Practice

## 2024-05-22 ENCOUNTER — Encounter: Payer: Self-pay | Admitting: Internal Medicine

## 2024-05-22 DIAGNOSIS — I639 Cerebral infarction, unspecified: Secondary | ICD-10-CM | POA: Diagnosis not present

## 2024-05-22 DIAGNOSIS — I5032 Chronic diastolic (congestive) heart failure: Secondary | ICD-10-CM | POA: Diagnosis not present

## 2024-05-22 DIAGNOSIS — I10 Essential (primary) hypertension: Secondary | ICD-10-CM | POA: Diagnosis not present

## 2024-05-22 HISTORY — PX: PEG PLACEMENT: SHX5437

## 2024-05-22 LAB — GLUCOSE, CAPILLARY
Glucose-Capillary: 110 mg/dL — ABNORMAL HIGH (ref 70–99)
Glucose-Capillary: 141 mg/dL — ABNORMAL HIGH (ref 70–99)
Glucose-Capillary: 145 mg/dL — ABNORMAL HIGH (ref 70–99)
Glucose-Capillary: 156 mg/dL — ABNORMAL HIGH (ref 70–99)
Glucose-Capillary: 159 mg/dL — ABNORMAL HIGH (ref 70–99)
Glucose-Capillary: 164 mg/dL — ABNORMAL HIGH (ref 70–99)
Glucose-Capillary: 189 mg/dL — ABNORMAL HIGH (ref 70–99)
Glucose-Capillary: 207 mg/dL — ABNORMAL HIGH (ref 70–99)

## 2024-05-22 MED ORDER — KETAMINE HCL 50 MG/5ML IJ SOSY
PREFILLED_SYRINGE | INTRAMUSCULAR | Status: AC
  Filled 2024-05-22: qty 5

## 2024-05-22 MED ORDER — LIDOCAINE HCL (PF) 2 % IJ SOLN
INTRAMUSCULAR | Status: AC
  Filled 2024-05-22: qty 5

## 2024-05-22 MED ORDER — SODIUM CHLORIDE 0.9 % IV SOLN: INTRAVENOUS | Status: DC

## 2024-05-22 MED ORDER — KETAMINE HCL 10 MG/ML IJ SOLN
INTRAMUSCULAR | Status: DC | PRN
  Administered 2024-05-22: 25 mg via INTRAVENOUS

## 2024-05-22 MED ORDER — MIDAZOLAM HCL 2 MG/2ML IJ SOLN
INTRAMUSCULAR | Status: AC
  Filled 2024-05-22: qty 2

## 2024-05-22 MED ORDER — DEXMEDETOMIDINE HCL IN NACL 80 MCG/20ML IV SOLN
INTRAVENOUS | Status: DC | PRN
  Administered 2024-05-22: 12 ug via INTRAVENOUS

## 2024-05-22 MED ORDER — CLOPIDOGREL BISULFATE 75 MG PO TABS
75.0000 mg | ORAL_TABLET | Freq: Every day | ORAL | Status: DC
  Administered 2024-05-23: 75 mg

## 2024-05-22 MED ORDER — MIDAZOLAM HCL 5 MG/5ML IJ SOLN
INTRAMUSCULAR | Status: DC | PRN
  Administered 2024-05-22: 1 mg via INTRAVENOUS

## 2024-05-22 MED ORDER — PROPOFOL 1000 MG/100ML IV EMUL
INTRAVENOUS | Status: AC
  Filled 2024-05-22: qty 100

## 2024-05-22 MED ORDER — CEFAZOLIN SODIUM-DEXTROSE 2-4 GM/100ML-% IV SOLN
2.0000 g | Freq: Once | INTRAVENOUS | Status: AC
  Administered 2024-05-22: 2 g via INTRAVENOUS

## 2024-05-22 MED ORDER — OSMOLITE 1.5 CAL PO LIQD
1000.0000 mL | ORAL | Status: DC
  Administered 2024-05-22 – 2024-05-23 (×2): 1000 mL

## 2024-05-22 MED ORDER — PROPOFOL 500 MG/50ML IV EMUL
INTRAVENOUS | Status: DC | PRN
  Administered 2024-05-22: 40 ug/kg/min via INTRAVENOUS

## 2024-05-22 NOTE — Transfer of Care (Signed)
 Immediate Anesthesia Transfer of Care Note  Patient: Juan Harper  Procedure(s) Performed: INSERTION, PEG TUBE  Patient Location: PACU  Anesthesia Type:General  Level of Consciousness: drowsy  Airway & Oxygen Therapy: Patient Spontanous Breathing and Patient connected to face mask oxygen  Post-op Assessment: Report given to RN and Post -op Vital signs reviewed and stable  Post vital signs: Reviewed and stable  Last Vitals:  Vitals Value Taken Time  BP 111/64 05/22/24 08:57  Temp 36 C 05/22/24 08:57  Pulse 85 05/22/24 08:58  Resp 25 05/22/24 08:58  SpO2 96 % 05/22/24 08:58  Vitals shown include unfiled device data.  Last Pain:  Vitals:   05/22/24 0857  TempSrc: Temporal  PainSc:          Complications: No notable events documented.

## 2024-05-22 NOTE — OR Nursing (Signed)
 PEG tube was inserted without complications by Dr. Maryruth and Dr. Rodolph. Ancef  was administered by CRNA prior to procedure. Post procedure [name] awakened and remained alert. He was kept on oxygen via mask to keep sats greater than 90%. He was transitioned to nasal cannula prior to transport back to room 108. He denied pain post procedure.

## 2024-05-22 NOTE — Progress Notes (Signed)
 Nutrition Follow-up  DOCUMENTATION CODES:   Severe malnutrition in context of social or environmental circumstances  INTERVENTION:   -TF via PEG:   Initiate Osmolite 1.5 @ 20 ml/hr and advance 10 ml/hr every 4 hours to goal rate of 60 ml/hr   60 ml Prosource TF20 BID     100 ml free water  flush every 4 hours   Tube feeding regimen provides 2320 kcal (100% of needs), 130 grams of protein, and 1097 ml of H2O. Total free water : 1697 ml daily   -Continue MVI with minerals daily via tube  NUTRITION DIAGNOSIS:   Severe Malnutrition related to social / environmental circumstances as evidenced by moderate fat depletion, severe fat depletion, moderate muscle depletion, severe muscle depletion.  Ongoing  GOAL:   Patient will meet greater than or equal to 90% of their needs  Met with TF  MONITOR:   TF tolerance  REASON FOR ASSESSMENT:   Consult Assessment of nutrition requirement/status  ASSESSMENT:   63 y.o. year old male with medical history of HTN, HLD, DMII presenting after having worsening slurred speech.  12/18- s/p BSE- NPO 12/19- s/p BSE- NPO 12/20- s/p BSE- NPO, NGT placed to start TF (tip and side port in stomach per KUB) 12/21- patient pulled out NGT, NGT replaced (tip of tube in distal stomach per KUB), s/p BSE- NPO 12/22- s/p MBSS- NPO- will likely need PEG 12/23- KUB reveals tip of NGT in stomach 12/24- NGT tip in duodenal bulb per CT 12/27- s/p BSE- NPO 12/28- NGT d/c, PEG placed by GI  Reviewed I/O's: +603 ml x 24 hours and +8.8 L since admission   Per neurology, CT angio head and neck showed moderate to severe bilateral ICA stenosis of the cavernous segments, moderate left A2 stenosis, severe multifocal left P2 and distal right P3 stenosis., Focal moderate to severe stenosis involving the proximal left P1 segment and focal severe stenosis involving the distal left V4 segment.   Case discussed with RN. PEG was placed today and requesting resumption of  TF orders.   Patient NPO per MBSS on 05/16/24; SLP anticipates long term recovery for dysphagia. 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 Per GI notes, PEG must be in place for at least 6 weeks, but can be used up to year prior to exchange.   Reviewed weights; weight has ranged from 82.2-84.9 kg over the past 7 days.   Per TOC notes, plan for SNF placement at discharge (patient has a bed offer at [facility]). Per CIR admissions notes, family prefers SNF placement.   Medications reviewed and include plavix .   Labs reviewed: CBGS: 110-239 (inpatient orders for glycemic control are 0-15 units insulin  aspart TID every 4 hours and 10 units insulin  glargine daily).    Diet Order:   Diet Order             Diet NPO time specified  Diet effective now                   EDUCATION NEEDS:   Not appropriate for education at this time  Skin:  Skin Assessment: Reviewed RN Assessment  Last BM:  05/19/24  Height:   Ht Readings from Last 1 Encounters:  05/11/24 5' 11 (1.803 m)    Weight:   Wt Readings from Last 1 Encounters:  05/22/24 83.4 kg    Ideal Body Weight:  78.2 kg  BMI:  Body mass index is 25.64 kg/m.  Estimated Nutritional Needs:  Kcal:  2300-2500  Protein:  115-130 grams  Fluid:  2.0-2.2 L    Margery ORN, RD, LDN, CDCES Registered Dietitian III Certified Diabetes Care and Education Specialist If unable to reach this RD, please use RD Inpatient group chat on secure chat between hours of 8am-4 pm daily

## 2024-05-22 NOTE — Plan of Care (Signed)
 Pt tolerated NG tube; slept all night; Peg tube placement today; stopped NG tube feed at midnight. Problem: Ischemic Stroke/TIA Tissue Perfusion: Goal: Complications of ischemic stroke/TIA will be minimized 05/22/2024 0750 by Moody Robben J, RN Outcome: Progressing 05/22/2024 0749 by Melven Odilia PARAS, RN Outcome: Progressing   Problem: Coping: Goal: Will verbalize positive feelings about self 05/22/2024 0750 by Melven Odilia PARAS, RN Outcome: Progressing 05/22/2024 0749 by Melven Odilia PARAS, RN Outcome: Progressing   Problem: Nutritional: Goal: Maintenance of adequate nutrition will improve 05/22/2024 0750 by Melven Odilia PARAS, RN Outcome: Progressing 05/22/2024 0749 by Melven Odilia PARAS, RN Outcome: Progressing   Problem: Metabolic: Goal: Ability to maintain appropriate glucose levels will improve 05/22/2024 0750 by Melven Odilia PARAS, RN Outcome: Progressing 05/22/2024 0749 by Melven Odilia PARAS, RN Outcome: Progressing   Problem: Tissue Perfusion: Goal: Adequacy of tissue perfusion will improve 05/22/2024 0750 by Melven Odilia PARAS, RN Outcome: Progressing 05/22/2024 0749 by Melven Odilia PARAS, RN Outcome: Progressing   Problem: Clinical Measurements: Goal: Ability to maintain clinical measurements within normal limits will improve 05/22/2024 0750 by Melven Odilia PARAS, RN Outcome: Progressing 05/22/2024 0749 by Melven Odilia PARAS, RN Outcome: Progressing

## 2024-05-22 NOTE — Anesthesia Preprocedure Evaluation (Signed)
 "                                  Anesthesia Evaluation  Patient identified by MRN, date of birth, ID band Patient awake    Reviewed: Allergy & Precautions, H&P , NPO status , Patient's Chart, lab work & pertinent test results  Airway Mallampati: II  TM Distance: >3 FB Neck ROM: full    Dental no notable dental hx. (+) Dental Advidsory Given   Pulmonary neg pulmonary ROS, asthma , sleep apnea and Continuous Positive Airway Pressure Ventilation , Current Smoker and Patient abstained from smoking.   Pulmonary exam normal breath sounds clear to auscultation       Cardiovascular hypertension, +CHF  negative cardio ROS Normal cardiovascular exam Rhythm:regular Rate:Normal     Neuro/Psych CVA, Residual Symptoms  negative psych ROS   GI/Hepatic negative GI ROS, Neg liver ROS,,,  Endo/Other  negative endocrine ROSdiabetes    Renal/GU negative Renal ROS  negative genitourinary   Musculoskeletal   Abdominal   Peds  Hematology negative hematology ROS (+)   Anesthesia Other Findings Patient presented to the hospital on 12/17 with slurred speech. Per MRI patient had a ischemic stroke. Patient is alert and oriented x3 but still has slurred speech.   Past Medical History: No date: Arthritis     Comment:  NECK/ RIGHT KNEE No date: Asthma No date: Bell's palsy No date: BPH (benign prostatic hyperplasia) No date: Cough     Comment:  chronic No date: Diabetes mellitus without complication (HCC) No date: Edema     Comment:  legs/feet No date: Fusion of spine of cervical region     Comment:  PLANNED FOR APRIL No date: HOH (hard of hearing) No date: Hypertension No date: Orthopnea No date: Shortness of breath dyspnea No date: Sleep apnea     Comment:  cpap No date: Wears dentures     Comment:  partial upper and lower No date: Wheezing  Past Surgical History: No date: BACK SURGERY 07/31/2016: CATARACT EXTRACTION W/PHACO; Right     Comment:  Procedure:  CATARACT EXTRACTION PHACO AND INTRAOCULAR               LENS PLACEMENT (IOC);  Surgeon: Adine Oneil Novak, MD;                Location: [facility] ORS;  Service: Ophthalmology;  Laterality:              Right;  Lot # [id number] H US :00:26.9 AP%:6.8% CDE: 1.83 06/24/2021: CATARACT EXTRACTION W/PHACO; Left     Comment:  Procedure: CATARACT EXTRACTION PHACO AND INTRAOCULAR               LENS PLACEMENT (IOC) LEFT DIABETIC;  Surgeon: Novak Adine Oneil, MD;  Location: [facility];                Service: Ophthalmology;  Laterality: Left;  3.35 0:23.9 No date: COLONOSCOPY No date: NECK MASS EXCISION No date: PILONIDAL CYST EXCISION  BMI    Body Mass Index: 25.64 kg/m      Reproductive/Obstetrics negative OB ROS                              Anesthesia Physical Anesthesia Plan  ASA: 4  Anesthesia Plan: General  Post-op Pain Management: Minimal or no pain anticipated   Induction: Intravenous  PONV Risk Score and Plan: 2 and Propofol  infusion and TIVA  Airway Management Planned: Nasal Cannula  Additional Equipment: None  Intra-op Plan:   Post-operative Plan:   Informed Consent: I have reviewed the patients History and Physical, chart, labs and discussed the procedure including the risks, benefits and alternatives for the proposed anesthesia with the patient or authorized representative who has indicated his/her understanding and acceptance.     Dental advisory given  Plan Discussed with: CRNA and Surgeon  Anesthesia Plan Comments: (Discussed risks of anesthesia with patient, including possibility of difficulty with spontaneous ventilation under anesthesia necessitating airway intervention, PONV, and rare risks such as cardiac or respiratory or neurological events, and allergic reactions. Discussed the role of CRNA in patient's perioperative care. Patient understands.)        Anesthesia Quick Evaluation  "

## 2024-05-22 NOTE — Anesthesia Postprocedure Evaluation (Signed)
"   Anesthesia Post Note  Patient: Juan Harper  Procedure(s) Performed: INSERTION, PEG TUBE  Patient location during evaluation: PACU Anesthesia Type: General Level of consciousness: awake and alert Pain management: pain level controlled Vital Signs Assessment: post-procedure vital signs reviewed and stable Respiratory status: spontaneous breathing, nonlabored ventilation, respiratory function stable and patient connected to nasal cannula oxygen Cardiovascular status: blood pressure returned to baseline and stable Postop Assessment: no apparent nausea or vomiting Anesthetic complications: no   No notable events documented.   Last Vitals:  Vitals:   05/22/24 1011 05/22/24 1150  BP: 119/67 119/67  Pulse: 83   Resp: 16   Temp: 36.9 C   SpO2: 95%     Last Pain:  Vitals:   05/22/24 1011  TempSrc: Oral  PainSc:                  Debby Mines      "

## 2024-05-22 NOTE — Op Note (Addendum)
 [facility] Gastroenterology Patient Name: Juan Harper Procedure Date: 05/22/2024 8:18 AM MRN: [id number] Account #: [id number] Date of Birth: 08/19/1960 Admit Type: Inpatient Age: 63 Room: [facility] ENDO ROOM 4 Gender: Male Note Status: Finalized Instrument Name: Endoscope [id number] Procedure:             Upper GI endoscopy Indications:           Place PEG because patient is unable to eat due to                         stroke (CVA) Providers:             Ole Schick MD, MD, Lucas Sjogren Referring MD:          Delon CROME. Sharl (Referring MD) Medicines:             Monitored Anesthesia Care Complications:         No immediate complications. Procedure:             Pre-Anesthesia Assessment:                        - Prior to the procedure, a History and Physical was                         performed, and patient medications and allergies were                         reviewed. The patient is competent. The risks and                         benefits of the procedure and the sedation options and                         risks were discussed with the patient. All questions                         were answered and informed consent was obtained.                         Patient identification and proposed procedure were                         verified by the physician, the nurse, the                         anesthesiologist, the anesthetist and the technician                         in the endoscopy suite. Mental Status Examination:                         alert and oriented. Airway Examination: normal                         oropharyngeal airway and neck mobility. Respiratory                         Examination: clear to auscultation. CV Examination:  normal. Prophylactic Antibiotics: The patient requires                         prophylactic antibiotics for planned PEG placement.                         The patient received antibiotic  therapy before the                         procedure. Prior Anticoagulants: The patient has taken                         Plavix  (clopidogrel ), last dose was 5 days prior to                         procedure. ASA Grade Assessment: IV - A patient with                         severe systemic disease that is a constant threat to                         life. After reviewing the risks and benefits, the                         patient was deemed in satisfactory condition to                         undergo the procedure. The anesthesia plan was to use                         monitored anesthesia care (MAC). Immediately prior to                         administration of medications, the patient was                         re-assessed for adequacy to receive sedatives. The                         heart rate, respiratory rate, oxygen saturations,                         blood pressure, adequacy of pulmonary ventilation, and                         response to care were monitored throughout the                         procedure. The physical status of the patient was                         re-assessed after the procedure.                        After obtaining informed consent, the endoscope was                         passed under direct vision. Throughout the procedure,  the patient's blood pressure, pulse, and oxygen                         saturations were monitored continuously. The Endoscope                         was introduced through the mouth, and advanced to the                         second part of duodenum. After obtaining informed                         consent, the endoscope was passed under direct vision.                         Throughout the procedure, the patient's blood                         pressure, pulse, and oxygen saturations were monitored                         continuously.The upper GI endoscopy was accomplished                         without  difficulty. The patient tolerated the                         procedure well. Findings:      The examined esophagus was normal.      The entire examined stomach was normal.      The examined duodenum was normal.      The patient was placed in the supine position for PEG placement. The       stomach was insufflated to appose gastric and abdominal walls. A site       was located in the body of the stomach with excellent transillumination       and manual external pressure for placement. The abdominal wall was       marked and prepped in a sterile manner. The area was anesthetized with       0.5% lidocaine . The trocar needle was introduced through the abdominal       wall and into the stomach under direct endoscopic view. A snare was       introduced through the endoscope and opened in the gastric lumen. The       guide wire was passed through the trocar and into the open snare. The       snare was closed around the guide wire. The endoscope and snare were       removed, pulling the wire out through the mouth. A skin incision was       made at the site of needle insertion. The externally removable 20 Fr       EndoVive Safety gastrostomy tube was lubricated. The G-tube was tied to       the guide wire and pulled through the mouth and into the stomach. The       trocar needle was removed, and the gastrostomy tube was pulled out from       the stomach through the skin. The external bumper was attached to the  gastrostomy tube, and the tube was cut to remove the guide wire. The       final position of the gastrostomy tube was confirmed by relook       endoscopy, and skin marking noted to be 3.5 cm at the external bumper.       The final tension and compression of the abdominal wall by the PEG tube       and external bumper were checked and revealed that the bumper was loose       and lightly touching the skin. The feeding tube was capped, and the tube       site cleaned and dressed. Dr.  Maryruth performed the endoscopic portion       of the procedure while Dr. Rodolph performed the cutting and       pulling of PEG tube through abdominal wall. Impression:            - Normal esophagus.                        - Normal stomach.                        - Normal examined duodenum.                        - An externally removable PEG placement was                         successfully completed.                        - No specimens collected. Recommendation:        - Please follow the post-PEG recommendations                         including: Nutrition consult for formula and volume,                         start using PEG today, flush PEG daily with 60 ml                         water  and clean site with soap and water  daily and dry                         thoroughly.                        - Resume Plavix  (clopidogrel ) at prior dose tomorrow.                        - PEG must remain in place for 6 weeks. This PEG can                         remain in place for up to 1 year if needed. After one                         year it would need to be replaced. Procedure Code(s):     --- Professional ---  43246, 62, Esophagogastroduodenoscopy, flexible,                         transoral; with directed placement of percutaneous                         gastrostomy tube Diagnosis Code(s):     --- Professional ---                        P30.601, Other sequelae of cerebral infarction                        R63.39, Other feeding difficulties                        Z43.1, Encounter for attention to gastrostomy CPT copyright 2022 American [employer] Association. All rights reserved. The codes documented in this report are preliminary and upon coder review may  be revised to meet current compliance requirements. Ole Schick MD, MD 05/22/2024 9:36:37 AM Lucas Sjogren,  Number of Addenda: 0 Note Initiated On: 05/22/2024 8:18 AM Estimated Blood Loss:   Estimated blood loss was minimal.      [facility]

## 2024-05-22 NOTE — Progress Notes (Signed)
 " Progress Note   Patient: Juan Harper FMW:[id number] DOB: May 17, 1961 DOA: 05/11/2024     10 DOS: the patient was seen and examined on 05/22/2024   Brief hospital course: 63 year old M with PMH of DM-2, HTN, HLD and cocaine use brought to ED due to slurred speech and difficulty speaking. Patient was symptomatic for over 24 hours before arrival to ED  MRI brain showed acute nonhemorrhagic 10 mm infarct in the lateral aspect of thalamus and posterior limb of left IC, two acute punctate infarct in posterior limb of right IC and remote bilateral cerebral and brainstem CVA.   CT angio head and neck showed moderate to severe bilateral ICA stenosis of the cavernous segments, moderate left A2 stenosis, severe multifocal left P2 and distal right P3 stenosis.,  Focal moderate to severe stenosis involving the proximal left P1 segment and focal severe stenosis involving the distal left V4 segment.  TTE without significant finding  Still with dense dysarthria and severe dysphagia. NG tube placed and started on TF. SLP following. Therapy recommended SNF.    Principal Problem:   Cerebral infarction (HCC) Active Problems:   Diabetes (HCC)   HTN (hypertension)   BPH (benign prostatic hyperplasia)   Chronic diastolic CHF (congestive heart failure) (HCC)   Protein-calorie malnutrition, severe   Assessment and Plan:  Acute lacunar infarction:  Severe dysphagia. Presents with slurred speech.   Right facial droop and relative weakness in left leg compared to right persists.   MRI of the brain without contrast shows acute nonhemorrhagic 10mm infarct in the lateral aspect of the thalamus and posterior limb of the left internal capsule. Two acute punctate infarcts in the posterior limb of the right internal capsule. Moderate atrophy and confluent periventricular white matter changes extending to the subcortical regions bilaterally. This most likely reflects the sequelae of chronic microvascular  ischemia.Remote lacunar infarcts in the inferior cerebellum bilaterally and remote ischemic changes extending into the brainstem.  TTE showed a normal LVEF with no evidence of an intra-atrial shunt.  LDL 154.  A1c 8.1%. -Neurology recommends aspirin  and Plavix  for 3 months followed by aspirin  monotherapy -Continue Lipitor  80 mg daily -Remains n.p.o. after MBS and meds may be administered crushed.  SLP following. -NGT replaced on 12/21.  Continue tube feed PEG tube placed on 12/28.  Changed tube feeding via PEG tube.  Restart Plavix  tomorrow.     Uncontrolled type 2 diabetes with hyperglycemia:  A1c 8.1%. Continue sliding scale insulin , glucose not significant elevated today.     Essential hypertension:  Improved blood pressure control. Continue amlodipine  and lisinopril      History of polysubstance use:  Prior UDS positive for cocaine marijuana     Hypomagnesemia Recheck labs tomorrow.     Severe malnutrition Body mass index is 25.27 kg/m. Nutrition Problem: Severe Malnutrition Etiology: social / environmental circumstances Signs/Symptoms: moderate fat depletion, severe fat depletion, moderate muscle depletion, severe muscle depletion Interventions: Tube feeding        Subjective:  Patient is status post PEG tube placement, a little sleepy.  Otherwise no complaint.  Physical Exam: Vitals:   05/22/24 0927 05/22/24 0937 05/22/24 1011 05/22/24 1150  BP: 113/70  119/67 119/67  Pulse: 81 82 83   Resp: 20  16   Temp:   98.4 F (36.9 C)   TempSrc:   Oral   SpO2: 93% 93% 95%   Weight:      Height:       General exam: Appears calm and comfortable  Respiratory system: Clear to auscultation. Respiratory effort normal. Cardiovascular system: S1 & S2 heard, RRR. No JVD, murmurs, rubs, gallops or clicks. No pedal edema. Gastrointestinal system: Abdomen is nondistended, soft and nontender. No organomegaly or masses felt. Normal bowel sounds heard. Central nervous system:  Drowsy and oriented x2.  Speech slurred Extremities: Symmetric 5 x 5 power. Skin: No rashes, lesions or ulcers Psychiatry: Flat affect   Data Reviewed:  There are no new results to review at this time.  Family Communication: None  Disposition: Status is: Inpatient Remains inpatient appropriate because: Severity of disease, postprocedure.  Pending placement     Time spent: 35 minutes  Author: Murvin Mana, MD 05/22/2024 1:48 PM  For on call review [email or web address].    "

## 2024-05-23 LAB — PHOSPHORUS: Phosphorus: 3.5 mg/dL (ref 2.5–4.6)

## 2024-05-23 LAB — BASIC METABOLIC PANEL WITH GFR
Anion gap: 12 (ref 5–15)
BUN: 51 mg/dL — ABNORMAL HIGH (ref 8–23)
CO2: 26 mmol/L (ref 22–32)
Calcium: 9.2 mg/dL (ref 8.9–10.3)
Chloride: 99 mmol/L (ref 98–111)
Creatinine, Ser: 1 mg/dL (ref 0.61–1.24)
GFR, Estimated: >60 mL/min
Glucose, Bld: 146 mg/dL — ABNORMAL HIGH (ref 70–99)
Potassium: 4.9 mmol/L (ref 3.5–5.1)
Sodium: 136 mmol/L (ref 135–145)

## 2024-05-23 LAB — GLUCOSE, CAPILLARY
Glucose-Capillary: 183 mg/dL — ABNORMAL HIGH (ref 70–99)
Glucose-Capillary: 216 mg/dL — ABNORMAL HIGH (ref 70–99)
Glucose-Capillary: 153 mg/dL — ABNORMAL HIGH (ref 70–99)
Glucose-Capillary: 131 mg/dL — ABNORMAL HIGH (ref 70–99)

## 2024-05-23 MED ORDER — ATORVASTATIN CALCIUM 80 MG PO TABS: 80.0000 mg | ORAL_TABLET | Freq: Every day | ORAL | Status: AC

## 2024-05-23 MED ORDER — VITAMIN B-12 1000 MCG PO TABS: 1000.0000 ug | ORAL_TABLET | Freq: Every day | ORAL | Status: AC

## 2024-05-23 MED ORDER — FREE WATER: 100.0000 mL | Status: DC

## 2024-05-23 MED ORDER — LISINOPRIL 40 MG PO TABS: 40.0000 mg | ORAL_TABLET | Freq: Every day | ORAL | Status: AC

## 2024-05-23 MED ORDER — PROSOURCE TF20 ENFIT COMPATIBL EN LIQD: 60.0000 mL | Freq: Two times a day (BID) | ENTERAL | Status: AC

## 2024-05-23 MED ORDER — CLOPIDOGREL BISULFATE 75 MG PO TABS: 75.0000 mg | ORAL_TABLET | Freq: Every day | ORAL | Status: AC

## 2024-05-23 MED ORDER — OSMOLITE 1.5 CAL PO LIQD: 1000.0000 mL | ORAL | Status: DC

## 2024-05-23 MED ORDER — AMLODIPINE BESYLATE 10 MG PO TABS: 10.0000 mg | ORAL_TABLET | Freq: Every day | ORAL | Status: AC

## 2024-05-23 MED ORDER — ADULT MULTIVITAMIN W/MINERALS CH: 1.0000 | ORAL_TABLET | Freq: Every day | ORAL | Status: AC

## 2024-05-23 MED ORDER — MELATONIN 5 MG PO CAPS: 1.0000 | ORAL_CAPSULE | Freq: Every day | ORAL | Status: AC

## 2024-05-23 MED ORDER — ASPIRIN 81 MG PO CHEW: 81.0000 mg | CHEWABLE_TABLET | Freq: Every day | ORAL | Status: AC

## 2024-05-23 NOTE — Progress Notes (Signed)
 Nutrition Follow-up  DOCUMENTATION CODES:   Severe malnutrition in context of social or environmental circumstances  INTERVENTION:   -TF via PEG:   Osmolite 1.5 @ 60 ml/hr  60 ml Prosource TF20 BID     100 ml free water  flush every 4 hours   Tube feeding regimen provides 2320 kcal (100% of needs), 130 grams of protein, and 1097 ml of H2O. Total free water : 1697 ml daily   -Continue MVI with minerals daily via tube  NUTRITION DIAGNOSIS:   Severe Malnutrition related to social / environmental circumstances as evidenced by moderate fat depletion, severe fat depletion, moderate muscle depletion, severe muscle depletion.  Ongoing  GOAL:   Patient will meet greater than or equal to 90% of their needs  Met with TF  MONITOR:   TF tolerance  REASON FOR ASSESSMENT:   Consult Assessment of nutrition requirement/status  ASSESSMENT:   63 y.o. year old male with medical history of HTN, HLD, DMII presenting after having worsening slurred speech.  12/18- s/p BSE- NPO 12/19- s/p BSE- NPO 12/20- s/p BSE- NPO, NGT placed to start TF (tip and side port in stomach per KUB) 12/21- patient pulled out NGT, NGT replaced (tip of tube in distal stomach per KUB), s/p BSE- NPO 12/22- s/p MBSS- NPO- will likely need PEG 12/23- KUB reveals tip of NGT in stomach 12/24- NGT tip in duodenal bulb per CT 12/27- s/p BSE- NPO 12/28- NGT d/c, PEG placed by GI  Reviewed I/O's: +510 ml x 24 hours and +9.3 L since admission   Per neurology, CT angio head and neck showed moderate to severe bilateral ICA stenosis of the cavernous segments, moderate left A2 stenosis, severe multifocal left P2 and distal right P3 stenosis., Focal moderate to severe stenosis involving the proximal left P1 segment and focal severe stenosis involving the distal left V4 segment.   Patient NPO per MBSS on 05/16/24; SLP anticipates long term recovery for dysphagia. Anticipate long term need (>3 months) for enteral nutrition  dependence. Per GI notes, PEG must be in place for at least 6 weeks, but can be used up to year prior to exchange.   Patient sitting up in bed, sleepy at and less interactive at time of visit. RN reports pt has been sleepy since yesterday, contributing this to sedation from yesterday's procedure. Osmolite 1.5 infusing via PEG at 60 ml/hr. Patient tolerating well.   Reviewed weights; weight has ranged from 82.2-84.9 kg over the past 7 days.    Per TOC notes, plan for SNF placement at discharge (patient has [facility] bed offer [facility] Per [facility] admissions notes, family prefers SNF placement.   Medications reviewed and include plavix .   Labs reviewed: CBGS: 110-216 (inpatient orders for glycemic control are 0-15 units insulin  aspart every 4 hours and 10 units insulin  glargine daily).    Diet Order:   Diet Order             Diet NPO time specified  Diet effective now                   EDUCATION NEEDS:   Not appropriate for education at this time  Skin:  Skin Assessment: Reviewed RN Assessment  Last BM:  05/23/24 (type 6)  Height:   Ht Readings from Last 1 Encounters:  05/11/24 5' 11 (1.803 m)    Weight:   Wt Readings from Last 1 Encounters:  05/23/24 82.6 kg    Ideal Body Weight:  78.2 kg  BMI:  Body mass index is 25.4 kg/m.  Estimated Nutritional Needs:   Kcal:  2300-2500  Protein:  115-130 grams  Fluid:  2.0-2.2 L    Margery ORN, RD, LDN, CDCES Registered Dietitian III Certified Diabetes Care and Education Specialist If unable to reach this RD, please use RD Inpatient group chat on secure chat between hours of 8am-4 pm daily

## 2024-05-23 NOTE — Op Note (Signed)
 Preoperative diagnosis: Dysphagia due to cerobrovascular accident with need for prolonged enteral nutrition.  Postoperative diagnosis: Same.  Procedure: Percutaneous endoscopic gastrostomy.    Anesthesia: MAC  Surgeon: Dr. Rodolph, MD  Wound Classification: Clean Contaminated  Indications: Patient is a 63 y.o. male required prolonged enteral nutrition because of stroke. Percutaneous endoscopic gastrostomy was chosen as the route of nutritional support.   Findings: 1. Adequate transilumination with endoscope on upper abdomen 2. Gastrostome 20Fr. Introduced 3. Endoscopy confirmed correct placement of gastrostomy in stomach  Description of procedure: The patient was taken to the procedure room and sedated.  A time-out was completed verifying correct patient, procedure, site, positioning, and implant(s) and/or special equipment prior to beginning this procedure.  After adequate sedation, a mouthpiece was placed in the patients mouth and Dr. Maryruth advanced endoscope down into the stomach. No abnormalities were noted. The stomach was insufflated with air and the endoscope positioned in the midportion and directed toward the anterior abdominal wall. With the room darkened and intensity turned up on the endoscope, a good light reflex was noted on the skin of the abdominal wall in the left upper quadrant. I performed finger pressure at the light reflex with adequate indentation on the stomach wall on endoscopy. A polypectomy snare was passed into the stomach, opened fully, and positioned so that the loop encircled the point of demonstrated finger indentation.  I anesthetized the skin with lidocaine  and made a 1.0-cm incision at the chosen site. The introducer needle with overlying catheter was passed through this incision and into the stomach under visualization with the gastroscope. The needle and catheter were gently captured by the endoscopic snare. The guide wire was passed and snared. The  endoscope, snare, and guide wire were then withdrawn and pulled back out of the mouth. The gastrostomy tube was attached to the loop of the guide wire and the whole thing pulled back into the stomach until the 2.5-cm mark of the gastrostomy tube was noted at skin level.  Dr. Maryruth reintroduced he gastroscope and adequate placement of the gastrostomy tube was identified and a picture was taken. The gastrostomy tube end was cut to length and the clamping appendage was placed. A collecting bag was applied.  The patient tolerated the procedure well and was taken to the postanesthesia care unit in good condition.   Specimen: None  Complications: None  Estimated Blood Loss: None

## 2024-05-23 NOTE — Plan of Care (Signed)
" °  Problem: Health Behavior/Discharge Planning: Goal: Ability to manage health-related needs will improve Outcome: Progressing   Problem: Nutrition: Goal: Dietary intake will improve Outcome: Progressing   Problem: Fluid Volume: Goal: Ability to maintain a balanced intake and output will improve Outcome: Progressing   Problem: Safety: Goal: Ability to remain free from injury will improve Outcome: Progressing   "

## 2024-05-23 NOTE — Progress Notes (Signed)
 Discharge instructions called to [facility]. To [employer]. Acknowledged receipt of the information.  Awaiting pickup by [facility]. 3rd in line.

## 2024-05-23 NOTE — TOC Transition Note (Signed)
 Transition of Care [facility]) - Discharge Note   Patient Details  Name: Juan Harper MRN: [id number] Date of Birth: 12/08/1960  [facility] CM/SW Contact:  Nathanael CHRISTELLA Ring, RN Phone Number: 05/23/2024, 3:03 PM   Clinical Narrative:    Patient is medically cleared for discharge to SNF.  He is going to [facility] [place] room E4.  Bedside nurse can call report to [phone].  [facility] has been arranged for transport and patient is 3rd in line for pickup.  Son, Deon, called and notified of discharge.     Final next level of care: Skilled Nursing Facility Barriers to Discharge: Barriers Resolved   Patient Goals and CMS Choice Patient states their goals for this hospitalization and ongoing recovery are:: son agrees to SNF at [email or web address].gov Compare Post Acute Care list provided to:: Patient Represenative (must comment) Choice offered to / list presented to : Adult Children      Discharge Placement PASRR number recieved: 05/17/24            Patient chooses bed at: [facility] [place] Patient to be transferred to facility by: [facility] Name of family member notified: Deon- son Patient and family notified of of transfer: 05/23/24  Discharge Plan and Services Additional resources added to the After Visit Summary for                  DME Arranged: N/A         HH Arranged: NA          Social Drivers of Health (SDOH) Interventions SDOH Screenings   Food Insecurity: Patient Unable To Answer (05/12/2024)  Housing: Patient Unable To Answer (05/12/2024)  Transportation Needs: Patient Unable To Answer (05/12/2024)  Utilities: Patient Unable To Answer (05/12/2024)  Financial Resource Strain: High Risk (06/18/2023)   Received from [facility]  Social Connections: Patient Unable To Answer (05/12/2024)  Tobacco Use: High Risk (05/22/2024)     Readmission Risk Interventions     No data to display

## 2024-05-23 NOTE — [place] FL2 (Signed)
 " [place]  MEDICAID FL2 LEVEL OF CARE FORM     IDENTIFICATION  Patient Name: Juan Harper Birthdate: 11/14/1960 Sex: male Admission Date (Current Location): 05/11/2024  [facility] and [place] Number:  Chiropodist and Address:  [facility], [address], [place], [place] [address]      Provider Number: [id number]  Attending Physician Name and Address:  Laurita Pillion, MD  Relative Name and Phone Number:  Elvis Delon Ahumada, Emergency Contact  [phone]    Current Level of Care: Hospital Recommended Level of Care: Skilled Nursing Facility Prior Approval Number:    Date Approved/Denied:   PASRR Number: [id number]  Discharge Plan: SNF    Current Diagnoses: Patient Active Problem List   Diagnosis Date Noted   Protein-calorie malnutrition, severe 05/14/2024   Cerebral infarction (HCC) 05/11/2024   Fall at home, initial encounter 02/07/2021   Syncope 02/07/2021   Right hip pain 02/07/2021   BPH (benign prostatic hyperplasia)    Chronic diastolic CHF (congestive heart failure) (HCC)    AKI (acute kidney injury) 02/06/2021   Acute kidney injury 01/06/2018   Hyperkalemia 01/06/2018   Acute renal failure (ARF) 02/26/2017   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   Sleep apnea 11/24/2016   Diabetes (HCC) 11/24/2016   HTN (hypertension) 11/24/2016   Dizziness 11/24/2016   Acute on chronic renal failure 09/19/2016   Cervical disc disease with myelopathy 09/07/2016   Simple chronic bronchitis (HCC) 07/16/2016   Tobacco use 07/16/2016   Closed fracture of multiple ribs of left side 02/07/2016   BPH with obstruction/lower urinary tract symptoms 05/23/2014   Elevated PSA 05/23/2014   Erectile dysfunction 05/23/2014   Chest pain 12/10/2010    Orientation RESPIRATION BLADDER Height & Weight     Self, Time, Situation, Place  Normal Incontinent Weight: 82.6 kg Height:  5' 11 (180.3 cm)   BEHAVIORAL SYMPTOMS/MOOD NEUROLOGICAL BOWEL NUTRITION STATUS      Incontinent Feeding tube (Osmolite 1.5 continuous 60 ml/ hr- flush every 4 hours with 30 ml sterile water  and before and after meds.)  AMBULATORY STATUS COMMUNICATION OF NEEDS Skin   Extensive Assist Verbally Skin abrasions (hand and leg right and left)                       Personal Care Assistance Level of Assistance  Bathing, Feeding, Dressing Bathing Assistance: Maximum assistance Feeding assistance: Maximum assistance Dressing Assistance: Maximum assistance     Functional Limitations Info             SPECIAL CARE FACTORS FREQUENCY  PT (By licensed PT), OT (By licensed OT), Speech therapy     PT Frequency: 5 x week OT Frequency: 5 x week     Speech Therapy Frequency: 5 x week      Contractures      Additional Factors Info  Code Status, Allergies Code Status Info: FULL Allergies Info: BEE Venom           Current Medications (05/23/2024):  This is the current hospital active medication list Current Facility-Administered Medications  Medication Dose Route Frequency Provider Last Rate Last Admin   acetaminophen  (TYLENOL ) tablet 650 mg  650 mg Per Tube Q6H PRN Niels Kayla FALCON, [facility]       Or   acetaminophen  (TYLENOL ) suppository 650 mg  650 mg Rectal Q6H PRN Niels Kayla FALCON, RPH       amLODipine  (NORVASC ) tablet 10 mg  10 mg  Per Tube Daily Gonfa, Taye T, MD   10 mg at 05/23/24 9182   aspirin  chewable tablet 81 mg  81 mg Per Tube Daily Gonfa, Taye T, MD   81 mg at 05/23/24 0816   atorvastatin  (LIPITOR ) tablet 80 mg  80 mg Per Tube Daily Gonfa, Taye T, MD   80 mg at 05/23/24 0816   clopidogrel  (PLAVIX ) tablet 75 mg  75 mg Per Tube Daily Laurita Pillion, MD   75 mg at 05/23/24 0816   feeding supplement (OSMOLITE 1.5 CAL) liquid 1,000 mL  1,000 mL Per Tube Continuous Zhang, Dekui, MD 60 mL/hr at 05/23/24 1058 1,000 mL at 05/23/24 1058   feeding supplement (PROSource TF20) liquid 60 mL  60 mL Per  Tube BID Laurita Pillion, MD   60 mL at 05/23/24 0846   free water  100 mL  100 mL Per Tube Q4H Laurita Pillion, MD   100 mL at 05/23/24 9173   haloperidol  lactate (HALDOL ) injection 2 mg  2 mg Intravenous Q6H PRN Zhang, Dekui, MD   2 mg at 05/23/24 9685   hydrALAZINE  (APRESOLINE ) tablet 25 mg  25 mg Per Tube Q6H PRN Gonfa, Taye T, MD       insulin  aspart (novoLOG ) injection 0-15 Units  0-15 Units Subcutaneous Q4H Agbata, Tochukwu, MD   5 Units at 05/23/24 0818   insulin  glargine (LANTUS ) injection 10 Units  10 Units Subcutaneous Daily Agbata, Tochukwu, MD   10 Units at 05/23/24 0846   lisinopril  (ZESTRIL ) tablet 40 mg  40 mg Per Tube Daily Gonfa, Taye T, MD   40 mg at 05/23/24 9182   multivitamin with minerals tablet 1 tablet  1 tablet Per Tube Daily Gonfa, Taye T, MD   1 tablet at 05/23/24 0817   ondansetron  (ZOFRAN ) tablet 4 mg  4 mg Per Tube Q6H PRN Niels Kayla FALCON, [facility]       Or   ondansetron  (ZOFRAN ) injection 4 mg  4 mg Intravenous Q6H PRN Greenwood, Howard F, RPH       polyethylene glycol (MIRALAX  / GLYCOLAX ) packet 17 g  17 g Per Tube BID PRN Gonfa, Taye T, MD   17 g at 05/20/24 9391   senna-docusate (Senokot-S) tablet 1 tablet  1 tablet Per Tube QHS PRN Kathrin Simmer T, MD   1 tablet at 05/22/24 2345   sodium chloride  flush (NS) 0.9 % injection 3 mL  3 mL Intravenous Q12H Khan, Ghalib, MD   3 mL at 05/23/24 9180     Discharge Medications: Please see discharge summary for a list of discharge medications.  Relevant Imaging Results:  Relevant Lab Results:   Additional Information [id number]  Nathanael CHRISTELLA Ring, RN     "

## 2024-05-23 NOTE — Discharge Summary (Signed)
 " Physician Discharge Summary   Patient: Juan Harper MRN: [id number] DOB: Mar 22, 1961  Admit date:     05/11/2024  Discharge date: 05/23/2024  Discharge Physician: Murvin Mana   PCP: Sharl Delon NOVAK, NP   Recommendations at discharge:   Follow-up with PCP in 1 week. Follow-up with neurology in 1 month. Continue followed by speech therapy for improvement of swallow function. Continue Plavix  for 90 days. Continue aspirin  indefinitely.  Discharge Diagnoses: Principal Problem:   Cerebral infarction [facility]) Active Problems:   Diabetes (HCC)   HTN (hypertension)   BPH (benign prostatic hyperplasia)   Chronic diastolic CHF (congestive heart failure) (HCC)   Protein-calorie malnutrition, severe  Resolved Problems:   * No resolved hospital problems. *  Hospital Course: 63 year old M with PMH of DM-2, HTN, HLD and cocaine use brought to ED due to slurred speech and difficulty speaking. Patient was symptomatic for over 24 hours before arrival to ED  MRI brain showed acute nonhemorrhagic 10 mm infarct in the lateral aspect of thalamus and posterior limb of left IC, two acute punctate infarct in posterior limb of right IC and remote bilateral cerebral and brainstem CVA.   CT angio head and neck showed moderate to severe bilateral ICA stenosis of the cavernous segments, moderate left A2 stenosis, severe multifocal left P2 and distal right P3 stenosis.,  Focal moderate to severe stenosis involving the proximal left P1 segment and focal severe stenosis involving the distal left V4 segment.  TTE without significant finding  Still with dense dysarthria and severe dysphagia. NG tube placed and started on TF. SLP following. Therapy recommended SNF.   Assessment and Plan: Acute lacunar infarction:  Severe dysphagia. Presents with slurred speech.   Right facial droop and relative weakness in left leg compared to right persists.   MRI of the brain without contrast shows acute nonhemorrhagic 10mm  infarct in the lateral aspect of the thalamus and posterior limb of the left internal capsule. Two acute punctate infarcts in the posterior limb of the right internal capsule. Moderate atrophy and confluent periventricular white matter changes extending to the subcortical regions bilaterally. This most likely reflects the sequelae of chronic microvascular ischemia.Remote lacunar infarcts in the inferior cerebellum bilaterally and remote ischemic changes extending into the brainstem.  TTE showed a normal LVEF with no evidence of an intra-atrial shunt.  LDL 154.  A1c 8.1%. -Neurology recommends aspirin  and Plavix  for 3 months followed by aspirin  monotherapy -Continue Lipitor  80 mg daily -NGT replaced on 12/21.   PEG tube placed on 12/28.  Changed tube feeding via PEG tube.      Uncontrolled type 2 diabetes with hyperglycemia:  A1c 8.1%. Resume home dose insulin .     Essential hypertension:  Improved blood pressure control. Continue amlodipine  and lisinopril      History of polysubstance use:  Prior UDS positive for cocaine marijuana     Hypomagnesemia Improved     Severe malnutrition Body mass index is 25.27 kg/m. Nutrition Problem: Severe Malnutrition Etiology: social / environmental circumstances Signs/Symptoms: moderate fat depletion, severe fat depletion, moderate muscle depletion, severe muscle depletion Interventions: Tube feeding                Consultants: Neurology, GI Procedures performed: PEG placement Disposition: Skilled nursing facility Diet recommendation:  Discharge Diet Orders (From admission, onward)     Start     Ordered   05/23/24 0000  Diet general       Comments: NPO, TF as ordered   05/23/24 1352  TF only DISCHARGE MEDICATION: Allergies as of 05/23/2024       Reactions   Bee Venom Itching        Medication List     STOP taking these medications    chlorthalidone 25 MG tablet Commonly known as: HYGROTON    dapagliflozin  propanediol 10 MG Tabs tablet Commonly known as: FARXIGA    Gvoke HypoPen 2-Pack 1 MG/0.2ML Soaj Generic drug: Glucagon   metFORMIN  500 MG tablet Commonly known as: GLUCOPHAGE    potassium chloride 10 MEQ tablet Commonly known as: KLOR-CON       TAKE these medications    acetaminophen  325 MG tablet Commonly known as: TYLENOL  Take 650 mg by mouth every 6 (six) hours as needed.   albuterol  108 (90 Base) MCG/ACT inhaler Commonly known as: VENTOLIN  HFA Inhale 2 puffs into the lungs every 6 (six) hours as needed for wheezing or shortness of breath.   amLODipine  10 MG tablet Commonly known as: NORVASC  Place 1 tablet (10 mg total) into feeding tube daily. Start taking on: May 24, 2024   aspirin  81 MG chewable tablet Place 1 tablet (81 mg total) into feeding tube daily. Start taking on: May 24, 2024   atorvastatin  80 MG tablet Commonly known as: LIPITOR  Place 1 tablet (80 mg total) into feeding tube daily. Start taking on: May 24, 2024   budesonide-formoterol  80-4.5 MCG/ACT inhaler Commonly known as: SYMBICORT Inhale 2 puffs into the lungs 2 (two) times daily.   clopidogrel  75 MG tablet Commonly known as: PLAVIX  Place 1 tablet (75 mg total) into feeding tube daily. Start taking on: May 24, 2024   cyanocobalamin 1000 MCG tablet Commonly known as: VITAMIN B12 Place 1 tablet (1,000 mcg total) into feeding tube daily. What changed: how to take this   diclofenac sodium 1 % Gel Commonly known as: VOLTAREN Apply 2 g topically 2 (two) times daily.   feeding supplement (OSMOLITE 1.5 CAL) Liqd Place 1,000 mLs into feeding tube continuous.   feeding supplement (PROSource TF20) liquid Place 60 mLs into feeding tube 2 (two) times daily.   free water  Soln Place 100 mLs into feeding tube every 4 (four) hours.   insulin  aspart 100 UNIT/ML FlexPen Commonly known as: NOVOLOG  Inject 0-6 Units into the skin 3 (three) times daily before meals  using sliding scale.   ipratropium 17 MCG/ACT inhaler Commonly known as: ATROVENT  HFA Inhale 2 puffs into the lungs every 6 (six) hours.   lisinopril  40 MG tablet Commonly known as: ZESTRIL  Place 1 tablet (40 mg total) into feeding tube daily. Start taking on: May 24, 2024 What changed:  medication strength how to take this   Melatonin 5 MG Caps 1 capsule (5 mg total) by PEG Tube route daily. What changed: how to take this   multivitamin with minerals Tabs tablet Place 1 tablet into feeding tube daily. Start taking on: May 24, 2024        Contact information for follow-up providers     Sharl Nest B, NP Follow up in 1 week(s).   Specialty: Nurse Practitioner Why: hospital follow up Contact information: [address] [place] [address] [phone]         [facility] Follow up in 1 month(s).   Contact information: [address] [place]  [address] [phone]             Contact information for after-discharge care     Destination     [facility] .  Service: Skilled Nursing Contact information: [address] [place]  [address] [phone]                    Discharge Exam: Filed Weights   05/20/24 0414 05/22/24 0500 05/23/24 0318  Weight: 84.4 kg 83.4 kg 82.6 kg   General exam: Appears calm and comfortable  Respiratory system: Clear to auscultation. Respiratory effort normal. Cardiovascular system: S1 & S2 heard, RRR. No JVD, murmurs, rubs, gallops or clicks. No pedal edema. Gastrointestinal system: Abdomen is nondistended, soft and nontender. No organomegaly or masses felt. Normal bowel sounds heard. Central nervous system: Alert and oriented x2, dysarthria. Extremities: Symmetric 5 x 5 power. Skin: No rashes, lesions or ulcers Psychiatry: Mood & affect appropriate.    Condition at discharge: fair  The results of  significant diagnostics from this hospitalization (including imaging, microbiology, ancillary and laboratory) are listed below for reference.   Imaging Studies: DG Abd 1 View Result Date: 05/20/2024 EXAM: 1 VIEW XRAY OF THE ABDOMEN 05/20/2024 01:41:55 PM COMPARISON: 05/17/2024 CLINICAL HISTORY: Encounter for feeding tube placement FINDINGS: LINES, TUBES AND DEVICES: Enteric tube in place with distal tip and side port terminating within the expected location of the gastric body. BOWEL: Nonobstructive bowel gas pattern. SOFT TISSUES: No abnormal calcifications. BONES: No acute fracture. IMPRESSION: 1. Enteric tube tip and side port overlying the stomach. Electronically signed by: Donnice Mania MD 05/20/2024 02:12 PM EST RP Workstation: [id number]   CT ABDOMEN PELVIS WO CONTRAST Result Date: 05/18/2024 CLINICAL DATA:  Assess anatomy prior to potential PEG placement. EXAM: CT ABDOMEN AND PELVIS WITHOUT CONTRAST TECHNIQUE: Multidetector CT imaging of the abdomen and pelvis was performed following the standard protocol without IV contrast. RADIATION DOSE REDUCTION: This exam was performed according to the departmental dose-optimization program which includes automated exposure control, adjustment of the mA and/or kV according to patient size and/or use of iterative reconstruction technique. COMPARISON:  None Available. FINDINGS: Lower chest: There are subpleural atelectatic changes in the visualized lung bases. No overt consolidation. No pleural effusion. The heart is normal in size. No pericardial effusion. Hepatobiliary: The liver is normal in size. Non-cirrhotic configuration. No suspicious mass. No intrahepatic or extrahepatic bile duct dilation. No calcified gallstones. Normal gallbladder wall thickness. No pericholecystic inflammatory changes. Pancreas: Unremarkable. No pancreatic ductal dilatation or surrounding inflammatory changes. Spleen: Within normal limits. No focal lesion. Adrenals/Urinary Tract:  Adrenal glands are unremarkable. No suspicious renal mass within the limitations of this unenhanced exam. There is a simple cortical cyst arising from the left kidney lower pole, posterolaterally measuring 1.7 x 2.1 cm. No nephroureterolithiasis or obstructive uropathy. Unremarkable urinary bladder. Stomach/Bowel: Normal stomach anatomy. Enteric tube is seen with its tip in the duodenal bulb. No disproportionate dilation of the small or large bowel loops. No evidence of abnormal bowel wall thickening or inflammatory changes. The appendix is unremarkable. Vascular/Lymphatic: No ascites or pneumoperitoneum. No abdominal or pelvic lymphadenopathy, by size criteria. No aneurysmal dilation of the major abdominal arteries. There are moderate peripheral atherosclerotic vascular calcifications of the aorta and its major branches. Reproductive: Enlarged prostate. Symmetric seminal vesicles. Other: There are bilateral small fat containing inguinal hernias. There is also a tiny fat containing umbilical hernia. The soft tissues and abdominal wall are otherwise unremarkable. Musculoskeletal: No suspicious osseous lesions. There are mild - moderate multilevel degenerative changes in the visualized spine. IMPRESSION: 1. No acute inflammatory process identified within the abdomen or pelvis. No upper gastrointestinal tract obstruction. 2. Multiple other nonacute observations, as described  above. Aortic Atherosclerosis (ICD10-I70.0). Electronically Signed   By: Ree Molt M.D.   On: 05/18/2024 14:24   DG Abd 1 View Result Date: 05/17/2024 CLINICAL DATA:  NG tube placement EXAM: ABDOMEN - 1 VIEW COMPARISON:  05/15/2024 FINDINGS: Enteric tube tip overlies the distal stomach. Enteral contrast within the colon. IMPRESSION: Enteric tube tip overlies the distal stomach. Electronically Signed   By: Luke Bun M.D.   On: 05/17/2024 23:42   DG Swallowing Func-Speech Pathology Result Date: 05/16/2024 Table formatting from the  original result was not included. Modified Barium Swallow Study Patient Details Name: Juan Harper MRN: [id number] Date of Birth: Feb 19, 1961 Today's Date: 05/16/2024 HPI/PMH: HPI: Per H&P, ZACARIAH BELUE is a 63 y.o. year old male with medical history of HTN, HLD, DMII,  presenting to the ED after having worsening slurred speech. MRI: 1. Acute nonhemorrhagic 10mm infarct in the lateral aspect of the thalamus and  posterior limb of the left internal capsule.  2. Two acute punctate infarcts in the posterior limb of the right internal  capsule.  3. Moderate atrophy and confluent periventricular white matter changes  extending to the subcortical regions bilaterally. This most likely reflects the  sequelae of chronic microvascular ischemia.  4. Remote lacunar infarcts in the inferior cerebellum bilaterally and remote  ischemic changes extending into the brainstem.   Patient was symptomatic for over 24 hours before arrival to ED.  Family is concerned about his living situation.  Toxicology report indicating positive for cocaine. Clinical Impression: Clinical Impression: Pt presents with moderate oropharyngeal dysphagia. Pharyngeal weakness notable for reduced BOT retraction, pharyngeal constriction, hyolaryngeal excursion, UES opening, and laryngeal closure, facilitating aspiration/penetration during/before swallow with thin liquids and trace penetration after the swallow of more viscous trials (thickened liquids). Pt not consistently sensate to aspiration of thin liquids. Residue collecting with majority at the vallecula and min at pyriform sinus. Cognition limiting use of compensatory strategies like triple swallow and effortful swallow to aid residue clearance. Oral phase significantly prolonged with intermittent oral holding, lingual pumping, piecemeal deglutition, and reduced labial seal, leading to anterior loss and trace-min oral residue. Safest recommendation would be NPO with allowance of medications crushed in  puree. SLP will follow up with therapeutic trials and introduction of pharyngeal strengthening methods. Pt likely to need time for recovery - would consider PEG. MD, RN, and dietician aware of recommendations. Factors that may increase risk of adverse event in presence of aspiration [name]: Factors that may increase risk of adverse event in presence of aspiration [name]: Reduced cognitive function; Limited mobility; Frail or deconditioned; Dependence for feeding and/or oral hygiene; Weak cough; Presence of tubes (ETT, trach, NG, etc.) Recommendations/Plan: Swallowing Evaluation Recommendations Swallowing Evaluation Recommendations Recommendations: NPO except meds Medication Administration: Crushed with puree Oral care recommendations: Oral care QID (4x/day); Staff/trained caregiver to provide oral care Treatment Plan Treatment Plan Treatment recommendations: Therapy as outlined in treatment plan below Follow-up recommendations: Follow physicians's recommendations for discharge plan and follow up therapies Functional status assessment: Patient has had a recent decline in their functional status and/or demonstrates limited ability to make significant improvements in function in a reasonable and predictable amount of time. Treatment frequency: Min 2x/week Treatment duration: 2 weeks Interventions: Oropharyngeal exercises; Compensatory techniques; Patient/family education; Aspiration precaution training Recommendations Recommendations for follow up therapy are one component of a multi-disciplinary discharge planning process, led by the attending physician.  Recommendations may be updated based on patient status, additional functional criteria and insurance  authorization. Assessment: Orofacial Exam: Orofacial Exam Oral Cavity: Oral Hygiene: WFL Oral Cavity - Dentition: Edentulous Orofacial Anatomy: WFL Oral Motor/Sensory Function: Suspected cranial nerve impairment CN V - Trigeminal: Not  tested CN VII - Facial: Right motor impairment CN IX - Glossopharyngeal, CN X - Vagus: WFL CN XII - Hypoglossal: WFL Anatomy: Anatomy: Presence of cervical hardware (Cervical Fusion ACDF and corpectomy 09/08/16) Boluses Administered: Boluses Administered Boluses Administered: Thin liquids (Level 0); Mildly thick liquids (Level 2, nectar thick); Moderately thick liquids (Level 3, honey thick); Puree  Oral Impairment Domain: Oral Impairment Domain Lip Closure: Escape beyond mid-chin Tongue control during bolus hold: Not tested Bolus preparation/mastication: Disorganized chewing/mashing with solid pieces of bolus unchewed Bolus transport/lingual motion: Repetitive/disorganized tongue motion Oral residue: Residue collection on oral structures Location of oral residue : Tongue; Floor of mouth Initiation of pharyngeal swallow : Pyriform sinuses  Pharyngeal Impairment Domain: Pharyngeal Impairment Domain Soft palate elevation: No bolus between soft palate (SP)/pharyngeal wall (PW) Laryngeal elevation: Partial superior movement of thyroid cartilage/partial approximation of arytenoids to epiglottic petiole Anterior hyoid excursion: Partial anterior movement Epiglottic movement: Partial inversion Laryngeal vestibule closure: Incomplete, narrow column air/contrast in laryngeal vestibule Pharyngeal stripping wave : Present - diminished Pharyngeal contraction (A/P view only): N/A Pharyngoesophageal segment opening: Partial distention/partial duration, partial obstruction of flow Tongue base retraction: Narrow column of contrast or air between tongue base and PPW Pharyngeal residue: Majority of contrast within or on pharyngeal structures Location of pharyngeal residue: Valleculae; Pyriform sinuses  Esophageal Impairment Domain: Esophageal Impairment Domain Esophageal clearance upright position: Complete clearance, esophageal coating Pill: Pill Consistency administered: -- (n/a) Penetration/Aspiration Scale Score:  Penetration/Aspiration Scale Score 1.  Material does not enter airway: Puree 3.  Material enters airway, remains ABOVE vocal cords and not ejected out: Mildly thick liquids (Level 2, nectar thick); Moderately thick liquids (Level 3, honey thick); Thin liquids (Level 0) (penetration after the swallow- secondary to residue- shallow; noted with thin liquid during the swallow) 8.  Material enters airway, passes BELOW cords without attempt by patient to eject out (silent aspiration) : Thin liquids (Level 0) (before swallow) Compensatory Strategies: Compensatory Strategies Compensatory strategies: Yes Straw: Ineffective Ineffective Straw: Mildly thick liquid (Level 2, nectar thick); Thin liquid (Level 0) Effortful swallow: Ineffective Ineffective Effortful Swallow: Moderately thick liquid (Level 3, honey thick) Multiple swallows: Ineffective Ineffective Multiple Swallows: Mildly thick liquid (Level 2, nectar thick); Moderately thick liquid (Level 3, honey thick)   General Information: Caregiver present: No  Diet Prior to this Study: NPO   Temperature : Normal   Respiratory Status: WFL   Supplemental O2: None (Room air)   History of Recent Intubation: No  Behavior/Cognition: Alert; Distractible Self-Feeding Abilities: Dependent for feeding (attempted to hold cup with ineffective feeding) Baseline vocal quality/speech: Hypophonia/low volume Volitional Cough: Unable to elicit Volitional Swallow: Able to elicit Exam Limitations: Poor positioning (shoulders slightly elevated) Goal Planning: Prognosis for improved oropharyngeal function: Fair Barriers to Reach Goals: Cognitive deficits; Motivation; Time post onset; Severity of deficits No data recorded Patient/Family Stated Goal: none stated Consulted and agree with results and recommendations: Patient; Physician; Nurse; Dietitian Pain: Pain Assessment Pain Assessment: Faces Faces Pain Scale: 0 Breathing: 0 Negative Vocalization: 1 Facial Expression: 0 Body Language: 1  Consolability: 1 PAINAD Score: 3 End of Session: Start Time:SLP Start Time (ACUTE ONLY): 0830 Stop Time: SLP Stop Time (ACUTE ONLY): 0915 Time Calculation:SLP Time Calculation (min) (ACUTE ONLY): 45 min Charges: SLP Evaluations $ SLP Speech Visit: 1 Visit SLP Evaluations $MBS Swallow: 1  Procedure $Swallowing Treatment: 1 Procedure SLP visit diagnosis: SLP Visit Diagnosis: Dysphagia, oropharyngeal phase (R13.12) Past Medical History: Past Medical History: Diagnosis Date  Arthritis   NECK/ RIGHT KNEE  Asthma   Bell's palsy   BPH (benign prostatic hyperplasia)   Cough   chronic  Diabetes mellitus without complication (HCC)   Edema   legs/feet  Fusion of spine of cervical region   PLANNED FOR APRIL  HOH (hard of hearing)   Hypertension   Orthopnea   Shortness of breath dyspnea   Sleep apnea   cpap  Wears dentures   partial upper and lower  Wheezing  Past Surgical History: Past Surgical History: Procedure Laterality Date  BACK SURGERY    CATARACT EXTRACTION W/PHACO Right 07/31/2016  Procedure: CATARACT EXTRACTION PHACO AND INTRAOCULAR LENS PLACEMENT (IOC);  Surgeon: Adine Oneil Novak, MD;  Location: [facility] ORS;  Service: Ophthalmology;  Laterality: Right;  Lot # [id number] H US :00:26.9 AP%:6.8% CDE: 1.83  CATARACT EXTRACTION W/PHACO Left 06/24/2021  Procedure: CATARACT EXTRACTION PHACO AND INTRAOCULAR LENS PLACEMENT (IOC) LEFT DIABETIC;  Surgeon: Novak Adine Oneil, MD;  Location: [facility];  Service: Ophthalmology;  Laterality: Left;  3.35 0:23.9  COLONOSCOPY    NECK MASS EXCISION    PILONIDAL CYST EXCISION   Jordan Jarrett Clapp, MS, CCC-SLP Speech Language Pathologist Rehab Services; [facility] [phone] (ascom) Jordan J Clapp 05/16/2024, 9:38 AM  DG Abd 1 View Result Date: 05/15/2024 EXAM: 1 VIEW XRAY OF THE ABDOMEN 05/15/2024 09:58:25 AM COMPARISON: 05/14/2024 CLINICAL HISTORY: Encounter for feeding tube placement [id number] FINDINGS: LINES, TUBES AND DEVICES: Enteric tube in place with tip at the distal  stomach. BOWEL: Nonobstructive bowel gas pattern. SOFT TISSUES: No abnormal calcifications. BONES: No acute fracture. IMPRESSION: 1. Enteric tube in place with tip at the distal stomach. Electronically signed by: Waddell Calk MD 05/15/2024 10:14 AM EST RP Workstation: [id number]   DG Chest 1 View Result Date: 05/14/2024 EXAM: 1 VIEW(S) XRAY OF THE CHEST 05/14/2024 11:05:03 PM COMPARISON: None available. CLINICAL HISTORY: Abnormal lung sounds FINDINGS: LINES, TUBES AND DEVICES: Enteric tube in place with tip and side port below the hemidiaphragm. LUNGS AND PLEURA: Density is noted over the left base corresponding to the inferior aspect of 6th rib on the left. No pleural effusion. No pneumothorax. HEART AND MEDIASTINUM: Atherosclerotic plaque noted. No acute abnormality of the cardiac and mediastinal silhouettes. BONES AND SOFT TISSUES: No acute osseous abnormality. IMPRESSION: 1. No acute cardiopulmonary abnormality. 2. Enteric tube tip and side port project below the hemidiaphragm. Electronically signed by: Oneil Devonshire MD 05/14/2024 11:21 PM EST RP Workstation: [email or web address] [id number] 1 View Result Date: 05/14/2024 EXAM: 1 VIEW XRAY OF THE ABDOMEN 05/14/2024 10:10:00 PM COMPARISON: 05/14/2024 CLINICAL HISTORY: Encounter for imaging study to confirm nasogastric (NG) tube placement. FINDINGS: LINES, TUBES AND DEVICES: Enteric tube in place with tip in the distal stomach. BOWEL: Nonobstructive bowel gas pattern. SOFT TISSUES: No abnormal calcifications. BONES: No acute fracture. IMPRESSION: 1. Enteric tube in place with tip in the distal stomach. Electronically signed by: Oneil Devonshire MD 05/14/2024 10:12 PM EST RP Workstation: [id number] 1 View Result Date: 05/14/2024 CLINICAL DATA:  Nasogastric tube placement. EXAM: ABDOMEN - 1 VIEW COMPARISON:  None Available. FINDINGS: Tip and side port of the enteric tube below the diaphragm in the stomach. Normal upper abdominal bowel gas pattern. IMPRESSION: Tip  and side port of the enteric tube below the diaphragm in the stomach. Electronically Signed   By: Andrea Gasman  M.D.   On: 05/14/2024 12:38   ECHOCARDIOGRAM COMPLETE BUBBLE STUDY Result Date: 05/13/2024    ECHOCARDIOGRAM REPORT   Patient Name:   Juan Harper Date of Exam: 05/13/2024 Medical Rec #:  [id number]     Height:       71.0 in Accession #:    [id number]    Weight:       187.6 lb Date of Birth:  Oct 07, 1960      BSA:          2.052 m Patient Age:    63 years      BP:           141/86 mmHg Patient Gender: M             HR:           76 bpm. Exam Location:  [facility] Procedure: 2D Echo, Cardiac Doppler, Color Doppler and Saline Contrast Bubble            Study (Both Spectral and Color Flow Doppler were utilized during            procedure). Indications:     Stroke 434.91 / I63.9  History:         Patient has no prior history of Echocardiogram examinations.                  Stroke.  Sonographer:     Ashley McNeely-Sloane Referring Phys:  [id number] MORENE BATHE Diagnosing Phys: Dwayne D Callwood MD IMPRESSIONS  1. Left ventricular ejection fraction, by estimation, is 60 to 65%. The left ventricle has normal function. The left ventricle has no regional wall motion abnormalities. There is mild left ventricular hypertrophy. Left ventricular diastolic parameters are consistent with Grade I diastolic dysfunction (impaired relaxation).  2. Right ventricular systolic function is normal. The right ventricular size is normal.  3. Left atrial size was mildly dilated.  4. The mitral valve is normal in structure. Trivial mitral valve regurgitation.  5. The aortic valve is normal in structure. Aortic valve regurgitation is trivial. Aortic valve sclerosis is present, with no evidence of aortic valve stenosis.  6. Agitated saline contrast bubble study was negative, with no evidence of any interatrial shunt. FINDINGS  Left Ventricle: Left ventricular ejection fraction, by estimation, is 60 to 65%. The left ventricle has normal  function. The left ventricle has no regional wall motion abnormalities. Strain was performed and the global longitudinal strain is indeterminate. The left ventricular internal cavity size was normal in size. There is mild left ventricular hypertrophy. Left ventricular diastolic parameters are consistent with Grade I diastolic dysfunction (impaired relaxation). Right Ventricle: The right ventricular size is normal. No increase in right ventricular wall thickness. Right ventricular systolic function is normal. Left Atrium: Left atrial size was mildly dilated. Right Atrium: Right atrial size was normal in size. Pericardium: There is no evidence of pericardial effusion. Mitral Valve: The mitral valve is normal in structure. Trivial mitral valve regurgitation. MV peak gradient, 3.4 mmHg. The mean mitral valve gradient is 2.0 mmHg. Tricuspid Valve: The tricuspid valve is normal in structure. Tricuspid valve regurgitation is trivial. Aortic Valve: The aortic valve is normal in structure. Aortic valve regurgitation is trivial. Aortic valve sclerosis is present, with no evidence of aortic valve stenosis. Aortic valve mean gradient measures 3.0 mmHg. Aortic valve peak gradient measures 4.8 mmHg. Aortic valve area, by VTI measures 2.38 cm. Pulmonic Valve: The pulmonic valve was not well visualized. Pulmonic valve regurgitation is not visualized. Aorta:  The aortic root was not well visualized. IAS/Shunts: No atrial level shunt detected by color flow Doppler. Agitated saline contrast was given intravenously to evaluate for intracardiac shunting. Agitated saline contrast bubble study was negative, with no evidence of any interatrial shunt. Additional Comments: 3D was performed not requiring image post processing on an independent workstation and was indeterminate.  LEFT VENTRICLE PLAX 2D LVIDd:         4.40 cm     Diastology LVIDs:         4.80 cm     LV e' medial:    6.31 cm/s LV PW:         1.50 cm     LV E/e' medial:  13.5 LV  IVS:        1.00 cm     LV e' lateral:   9.14 cm/s LVOT diam:     1.80 cm     LV E/e' lateral: 9.3 LV SV:         52 LV SV Index:   26 LVOT Area:     2.54 cm LV IVRT:       123 msec  LV Volumes (MOD) LV vol d, MOD A2C: 72.1 ml LV vol d, MOD A4C: 55.9 ml LV vol s, MOD A2C: 14.3 ml LV vol s, MOD A4C: 22.2 ml LV SV MOD A2C:     57.8 ml LV SV MOD A4C:     55.9 ml LV SV MOD BP:      46.1 ml RIGHT VENTRICLE             IVC RV Basal diam:  3.50 cm     IVC diam: 1.80 cm RV Mid diam:    3.20 cm RV S prime:     14.10 cm/s  PULMONARY VEINS TAPSE (M-mode): 2.0 cm      A Reversal Duration: 151.00 msec                             A Reversal Velocity: 30.50 cm/s                             Diastolic Velocity:  34.50 cm/s                             S/D Velocity:        1.30                             Systolic Velocity:   44.30 cm/s LEFT ATRIUM             Index        RIGHT ATRIUM           Index LA diam:        3.60 cm 1.75 cm/m   RA Area:     15.00 cm LA Vol (A2C):   63.8 ml 31.09 ml/m  RA Volume:   37.60 ml  18.32 ml/m LA Vol (A4C):   32.1 ml 15.64 ml/m LA Biplane Vol: 46.6 ml 22.71 ml/m  AORTIC VALVE                    PULMONIC VALVE AV Area (Vmax):    2.34 cm     PV Vmax:  0.88 m/s AV Area (Vmean):   2.22 cm     PV Vmean:       62.600 cm/s AV Area (VTI):     2.38 cm     PV VTI:         0.195 m AV Vmax:           110.00 cm/s  PV Peak grad:   3.1 mmHg AV Vmean:          73.900 cm/s  PV Mean grad:   2.0 mmHg AV VTI:            0.220 m      RVOT Peak grad: 2 mmHg AV Peak Grad:      4.8 mmHg AV Mean Grad:      3.0 mmHg LVOT Vmax:         101.00 cm/s LVOT Vmean:        64.500 cm/s LVOT VTI:          0.206 m LVOT/AV VTI ratio: 0.94  AORTA Ao Root diam: 3.10 cm Ao Asc diam:  3.10 cm MITRAL VALVE MV Area (PHT): 3.17 cm    SHUNTS MV Area VTI:   2.08 cm    Systemic VTI:  0.21 m MV Peak grad:  3.4 mmHg    Systemic Diam: 1.80 cm MV Mean grad:  2.0 mmHg    Pulmonic VTI:  0.161 m MV Vmax:       0.92 m/s MV Vmean:       68.6 cm/s MV Decel Time: 239 msec MV E velocity: 84.90 cm/s MV A velocity: 91.20 cm/s MV E/A ratio:  0.93 Dwayne D Callwood MD Electronically signed by Cara JONETTA Lovelace MD Signature Date/Time: 05/13/2024/2:55:16 PM    Final    CT ANGIO HEAD NECK W WO CM Result Date: 05/12/2024 CLINICAL DATA:  Follow-up examination for stroke. EXAM: CT ANGIOGRAPHY HEAD AND NECK WITH AND WITHOUT CONTRAST TECHNIQUE: Multidetector CT imaging of the head and neck was performed using the standard protocol during bolus administration of intravenous contrast. Multiplanar CT image reconstructions and MIPs were obtained to evaluate the vascular anatomy. Carotid stenosis measurements (when applicable) are obtained utilizing NASCET criteria, using the distal internal carotid diameter as the denominator. RADIATION DOSE REDUCTION: This exam was performed according to the departmental dose-optimization program which includes automated exposure control, adjustment of the mA and/or kV according to patient size and/or use of iterative reconstruction technique. CONTRAST:  75mL OMNIPAQUE  IOHEXOL  350 MG/ML SOLN COMPARISON:  Comparison made with CT and MRI from 05/11/2024. FINDINGS: CT HEAD FINDINGS Brain: Patchy and confluent hypodensity involving the supratentorial cerebral white matter, consistent with chronic small vessel ischemic disease, moderate in nature. Few scatter remote lacunar infarcts noted about the deep gray nuclei, pons, and cerebellum. Previously identified small acute ischemic infarcts involving the bilateral basal ganglia noted, grossly stable from prior MRI. No acute intracranial hemorrhage. No other acute large vessel territory infarct. No mass lesion or midline shift. No hydrocephalus or extra-axial fluid collection. Vascular: No abnormal hyperdense vessel. Skull: Scalp soft tissues demonstrate no acute finding. Calvarium intact. Sinuses/Orbits: Globes and orbital soft tissues within normal limits. Mild mucosal thickening  present about the ethmoidal air cells and maxillary sinuses. No significant mastoid effusion. Other: None. Review of the MIP images confirms the above findings CTA NECK FINDINGS Aortic arch: Visualized aortic arch within normal limits for caliber. Standard 3 vessel morphology. Aortic atherosclerosis. No significant stenosis about the origin the great vessels. Right carotid system: Eccentric atheromatous  plaque within the right CCA without significant stenosis. Calcified plaque about the right carotid bulb without hemodynamically significant greater than 50% stenosis. No dissection. Left carotid system: Eccentric plaque within the left CCA without hemodynamically significant stenosis. Calcified plaque about the left carotid bulb without hemodynamically significant greater than 50% stenosis. No dissection. Vertebral arteries: Both vertebral arteries arise from subclavian arteries. Right V1 segment is duplicated with separate origins from the right subclavian artery. Focal moderate to severe stenosis noted involving the proximal left V1 segment (series 10, image 299). No dissection. Skeleton: No worrisome osseous lesions. Prior fusion at C3-C5 with C4 corpectomy. Patient is edentulous. Other neck: No other acute finding. Upper chest: Paraseptal emphysema.  No other acute finding. Review of the MIP images confirms the above findings CTA HEAD FINDINGS Anterior circulation: Atheromatous plaque about the carotid siphons bilaterally. Resultant moderate to severe stenoses about the stenoses about the cavernous segments bilaterally. A1 segments patent bilaterally. Normal anterior communicating artery complex. Atheromatous change about the ACAs bilaterally with associated moderate left A2 stenosis (series 10, image 26). Atheromatous irregularity about the M1 segments bilaterally without high-grade stenosis. No proximal MCA branch occlusion. Distal MCA branches perfused and symmetric. Distal small vessel atheromatous  irregularity noted. Posterior circulation: Right V4 segment patent without stenosis. Focal severe stenosis involving the distal left V4 segment just prior to the vertebrobasilar junction (series 10, image 143). Left PICA patent. Right PICA not seen. Mild atheromatous irregularity about the basilar without significant stenosis. Superior cerebral arteries patent bilaterally. Both PCAs primarily supplied via the basilar. Atheromatous change about the PCAs bilaterally with is resultant severe multifocal left P2 stenoses, with additional severe distal right P3 stenosis (series 13, image 22). Venous sinuses: Patent allowing for timing the contrast bolus. Anatomic variants: As above.  No aneurysm. Review of the MIP images confirms the above findings IMPRESSION: CT HEAD: 1. Stable appearance of small acute ischemic infarcts involving the bilateral basal ganglia. No associated hemorrhage or mass effect. 2. Underlying moderate chronic microvascular ischemic disease with a few scattered remote lacunar infarcts about the deep gray nuclei, pons, and cerebellum. CTA HEAD AND NECK: 1. Negative CTA for acute large vessel occlusion or other emergent finding. 2. 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 3. Moderate left A2 stenosis. 4. Severe multifocal left P2 and distal right P3 stenoses. 5. Focal moderate to severe stenosis involving the proximal left V1 segment. 6. Focal severe stenosis involving the distal left V4 segment just prior to the vertebrobasilar junction. Aortic Atherosclerosis (ICD10-I70.0) and Emphysema (ICD10-J43.9). Electronically Signed   By: Morene Hoard M.D.   On: 05/12/2024 05:34   MR BRAIN WO CONTRAST Result Date: 05/11/2024 EXAM: MRI BRAIN WITHOUT CONTRAST 05/11/2024 04:15:00 PM TECHNIQUE: Multiplanar multisequence MRI of the head/brain was performed without the administration of intravenous contrast.  COMPARISON: CT head without contrast 05/11/2024. CLINICAL HISTORY: Neuro deficit, acute, stroke suspected. Slurred speech for 2 days. FINDINGS: LIMITATIONS/ARTIFACTS: The study is mildly degraded by patient motion. BRAIN AND VENTRICLES: An acute 10mm nonhemorrhagic infarct is confirmed in the lateral aspect of the thalamus and posterior limb of the left internal capsule. 2 punctate infarcts are present within the posterior limb of the right internal capsule. Moderate atrophy and confluent periventricular white matter changes are present bilaterally. These extend to the subcortical regions bilaterally. Remote lacunar infarcts are present in the inferior cerebellum bilaterally. Remote ischemic changes extend into the brainstem. No intracranial hemorrhage. No mass. No midline shift. No  hydrocephalus. The sella is unremarkable. Normal flow voids. ORBITS: No acute abnormality. SINUSES AND MASTOIDS: No acute abnormality. BONES AND SOFT TISSUES: Normal marrow signal. No acute soft tissue abnormality. IMPRESSION: 1. Acute nonhemorrhagic 10mm infarct in the lateral aspect of the thalamus and posterior limb of the left internal capsule. 2. Two acute punctate infarcts in the posterior limb of the right internal capsule. 3. Moderate atrophy and confluent periventricular white matter changes extending to the subcortical regions bilaterally. This most likely reflects the sequelae of chronic microvascular ischemia. 4. Remote lacunar infarcts in the inferior cerebellum bilaterally and remote ischemic changes extending into the brainstem. Critical value findings were called to Dr. Fernand. at 4:45pm. Electronically signed by: Lonni Necessary MD 05/11/2024 04:49 PM EST RP Workstation: [id number]   CT Cervical Spine Wo Contrast Result Date: 05/11/2024 EXAM: CT CERVICAL SPINE WITHOUT CONTRAST 05/11/2024 02:41:37 PM TECHNIQUE: CT of the cervical spine was performed without the administration of intravenous contrast. Multiplanar  reformatted images are provided for review. Automated exposure control, iterative reconstruction, and/or weight based adjustment of the mA/kV was utilized to reduce the radiation dose to as low as reasonably achievable. COMPARISON: CT of the cervical spine 02/06/2021. CLINICAL HISTORY: Multiple falls. FINDINGS: BONES AND ALIGNMENT: C4 corpectomy and anterior fusion C3-C5 is stable. No acute fracture or traumatic malalignment. DEGENERATIVE CHANGES: Adjacent level disease is again noted at C5-C6. Ossification in the posterior soft tissues is stable. SOFT TISSUES: No prevertebral soft tissue swelling. IMPRESSION: 1. Stable C4 corpectomy and anterior fusion C3-5. 2. No acute findings. Electronically signed by: Lonni Necessary MD 05/11/2024 03:00 PM EST RP Workstation: [id number]   CT HEAD WO CONTRAST Result Date: 05/11/2024 EXAM: CT HEAD WITHOUT CONTRAST 05/11/2024 02:41:37 PM TECHNIQUE: CT of the head was performed without the administration of intravenous contrast. Automated exposure control, iterative reconstruction, and/or weight based adjustment of the mA/kV was utilized to reduce the radiation dose to as low as reasonably achievable. COMPARISON: None available. CLINICAL HISTORY: Slurred speech x2 days. FINDINGS: BRAIN AND VENTRICLES: No acute hemorrhage. An acute/subacute 12 mm hypodense infarct is present in the posterior limb of the left internal capsule. Regression of moderate confluent periventricular and scattered subcortical T2 hyperintensities is noted bilaterally. Remote lacunar infarcts are present in the thalamus bilaterally. Remote lacunar infarcts are present in the inferior cerebellum. Remote lacunar infarcts are present in the anterior limb of the right internal capsule and the caudate head. No hydrocephalus. No extra-axial collection. No mass effect or midline shift. ORBITS: Bilateral lens replacements are noted. The globes and orbits are otherwise within normal limits. SINUSES: Mild mucosal  thickening is present in the maxillary sinuses bilaterally. SOFT TISSUES AND SKULL: No acute soft tissue abnormality. No skull fracture. IMPRESSION: 1. Acute/subacute 12 mm hypodense infarct in the posterior limb of the left internal capsule. 2. Remote lacunar infarcts in the thalami bilaterally, inferior cerebellum, anterior limb of the right internal capsule, and caudate head. 3. Moderate chronic microvascular ischemic white matter changes. Electronically signed by: Lonni Necessary MD 05/11/2024 02:57 PM EST RP Workstation: [id number]    Microbiology: Results for orders placed or performed during the hospital encounter of 02/06/21  Resp Panel by RT-PCR (Flu A&B, Covid) Nasopharyngeal Swab     Status: None   Collection Time: 02/06/21  7:26 PM   Specimen: Nasopharyngeal Swab; Nasopharyngeal(NP) swabs in vial transport medium  Result Value Ref Range Status   SARS Coronavirus 2 by RT PCR NEGATIVE NEGATIVE Final    Comment: (NOTE) SARS-CoV-2 target nucleic acids are NOT DETECTED.  The SARS-CoV-2 RNA is generally detectable in upper respiratory specimens during the acute phase of infection. The lowest concentration of SARS-CoV-2 viral copies this assay can detect is 138 copies/mL. A negative result does not preclude SARS-Cov-2 infection and should not be used as the sole basis for treatment or other patient management decisions. A negative result may occur with  improper specimen collection/handling, submission of specimen other than nasopharyngeal swab, presence of viral mutation(s) within the areas targeted by this assay, and inadequate number of viral copies(<138 copies/mL). A negative result must be combined with clinical observations, patient history, and epidemiological information. The expected result is Negative.  Fact Sheet for Patients:  bloggercourse.com  Fact Sheet for Healthcare Providers:  seriousbroker.it  This test is no  t yet approved or cleared by the [place]  FDA and  has been authorized for detection and/or diagnosis of SARS-CoV-2 by FDA under an Emergency Use Authorization (EUA). This EUA will remain  in effect (meaning this test can be used) for the duration of the COVID-19 declaration under Section 564(b)(1) of the Act, 21 U.S.C.section 360bbb-3(b)(1), unless the authorization is terminated  or revoked sooner.       Influenza A by PCR NEGATIVE NEGATIVE Final   Influenza B by PCR NEGATIVE NEGATIVE Final    Comment: (NOTE) The Xpert Xpress SARS-CoV-2/FLU/RSV plus assay is intended as an aid in the diagnosis of influenza from Nasopharyngeal swab specimens and should not be used as a sole basis for treatment. Nasal washings and aspirates are unacceptable for Xpert Xpress SARS-CoV-2/FLU/RSV testing.  Fact Sheet for Patients: bloggercourse.com  Fact Sheet for Healthcare Providers: seriousbroker.it  This test is not yet approved or cleared by the [place]  FDA and has been authorized for detection and/or diagnosis of SARS-CoV-2 by FDA under an Emergency Use Authorization (EUA). This EUA will remain in effect (meaning this test can be used) for the duration of the COVID-19 declaration under Section 564(b)(1) of the Act, 21 U.S.C. section 360bbb-3(b)(1), unless the authorization is terminated or revoked.  Performed at [facility], [address] [place], [place] [address]     Labs: CBC: Recent Labs  Lab 05/21/24 0547  WBC 7.4  HGB 13.5  HCT 41.5  MCV 86.8  PLT 297   Basic Metabolic Panel: Recent Labs  Lab 05/18/24 0601 05/21/24 0741 05/23/24 1033  NA 139 137 136  K 4.6 5.1 4.9  CL 102 100 99  CO2 27 27 26   GLUCOSE 227* 147* 146*  BUN 30* 40* 51*  CREATININE 1.04 0.98 1.00  CALCIUM  9.3 9.5 9.2  MG  --  2.2  --   PHOS  --   --  3.5   Liver Function Tests: No results for input(s): AST, ALT,  ALKPHOS, BILITOT, PROT, ALBUMIN in the last 168 hours. CBG: Recent Labs  Lab 05/22/24 2133 05/22/24 2335 05/23/24 0315 05/23/24 0743 05/23/24 1132  GLUCAP 207* 145* 183* 216* 153*    Discharge time spent: 35 minutes.  Signed: Murvin Mana, MD [facility] 05/23/2024 "

## 2024-05-23 NOTE — Progress Notes (Signed)
 "  Inpatient Follow-up/Progress Note   Patient ID: Juan Harper is a 63 y.o. male.  Overnight Events / Subjective Findings NAEON per nursing. Pt resting in chair. Tube feedings stopped prior to discharge. He had been tolerating without issue.  Review of Systems  Unable to perform ROS: Other (dysarthria from stroke)     Medications Current Medications[1]  feeding supplement (OSMOLITE 1.5 CAL) 60 mL/hr at 05/23/24 1605    acetaminophen  **OR** acetaminophen , haloperidol  lactate, hydrALAZINE , ondansetron  **OR** ondansetron  (ZOFRAN ) IV, polyethylene glycol, senna-docusate   Objective    Vitals:   05/22/24 2335 05/23/24 0318 05/23/24 0758 05/23/24 1139  BP: 118/69 135/62 123/66 115/61  Pulse: 80 82 83 84  Resp: 16  20 20   Temp: 98 F (36.7 C) 98 F (36.7 C) 98 F (36.7 C) 97.9 F (36.6 C)  TempSrc: Oral Oral Oral Oral  SpO2: 96% 93% 90% 94%  Weight:  82.6 kg    Height:         Physical Exam Constitutional:      General: He is not in acute distress.    Appearance: He is not diaphoretic.  HENT:     Head: Normocephalic and atraumatic.  Cardiovascular:     Rate and Rhythm: Normal rate and regular rhythm.  Pulmonary:     Effort: Pulmonary effort is normal.     Breath sounds: Normal breath sounds.  Abdominal:     General: Abdomen is flat. Bowel sounds are normal. There is no distension.     Tenderness: There is no abdominal tenderness. There is no guarding or rebound.     Comments: Peg tube site cdi. Bumper loosened to ~3.5cm at skin from 3cm  Neurological:     Mental Status: He is alert.      Laboratory Data Recent Labs  Lab 05/21/24 0547  WBC 7.4  HGB 13.5  HCT 41.5  PLT 297   Recent Labs  Lab 05/18/24 0601 05/21/24 0741 05/23/24 1033  NA 139 137 136  K 4.6 5.1 4.9  CL 102 100 99  CO2 27 27 26   BUN 30* 40* 51*  CREATININE 1.04 0.98 1.00  CALCIUM  9.3 9.5 9.2  GLUCOSE 227* 147* 146*   No results for input(s): INR in the last 168 hours.     Imaging Studies: No results found.  Assessment:  # Dysphagia - s/p peg tube placement 05/22/24  # Acute lacunar infarction  #HTN # DM2 # Severe malnutrition  Plan:   Peg site checked and loosened as above Pt tolerating tube feeds Follow up with Dr. Maryruth at [facility] in ~3 months for peg tube check  Management of other medical comorbidities as per primary team  I personally performed the service.  GI to sign off. Available as needed. Please do not hesitate to call regarding questions or concerns.  Thank you for allowing us  to participate in this patient's care.  Elspeth Ozell Jungling, DO [facility]  Portions of the record may have been created with voice recognition software. Occasional wrong-word or 'sound-a-like' substitutions may have occurred due to the inherent limitations of voice recognition software.  Read the chart carefully and recognize, using context, where substitutions may have occurred.     [1]  Current Facility-Administered Medications:    acetaminophen  (TYLENOL ) tablet 650 mg, 650 mg, Per Tube, Q6H PRN **OR** acetaminophen  (TYLENOL ) suppository 650 mg, 650 mg, Rectal, Q6H PRN, Niels Kayla FALCON, [facility]   amLODipine  (NORVASC ) tablet 10 mg, 10 mg, Per Tube, Daily,  Gonfa, Taye T, MD, 10 mg at 05/23/24 9182   aspirin  chewable tablet 81 mg, 81 mg, Per Tube, Daily, Gonfa, Taye T, MD, 81 mg at 05/23/24 0816   atorvastatin  (LIPITOR ) tablet 80 mg, 80 mg, Per Tube, Daily, Gonfa, Taye T, MD, 80 mg at 05/23/24 0816   clopidogrel  (PLAVIX ) tablet 75 mg, 75 mg, Per Tube, Daily, Zhang, Dekui, MD, 75 mg at 05/23/24 0816   feeding supplement (OSMOLITE 1.5 CAL) liquid 1,000 mL, 1,000 mL, Per Tube, Continuous, Laurita Pillion, MD, Last Rate: 60 mL/hr at 05/23/24 1605, Infusion Verify at 05/23/24 1605   feeding supplement (PROSource TF20) liquid 60 mL, 60 mL, Per Tube, BID, Zhang, Dekui, MD, 60 mL at 05/23/24 0846   free water  100 mL, 100 mL, Per Tube, Q4H,  Laurita Pillion, MD, 100 mL at 05/23/24 1605   haloperidol  lactate (HALDOL ) injection 2 mg, 2 mg, Intravenous, Q6H PRN, Zhang, Dekui, MD, 2 mg at 05/23/24 9685   hydrALAZINE  (APRESOLINE ) tablet 25 mg, 25 mg, Per Tube, Q6H PRN, Gonfa, Taye T, MD   insulin  aspart (novoLOG ) injection 0-15 Units, 0-15 Units, Subcutaneous, Q4H, Agbata, Tochukwu, MD, 2 Units at 05/23/24 1605   insulin  glargine (LANTUS ) injection 10 Units, 10 Units, Subcutaneous, Daily, Agbata, Tochukwu, MD, 10 Units at 05/23/24 0846   lisinopril  (ZESTRIL ) tablet 40 mg, 40 mg, Per Tube, Daily, Gonfa, Taye T, MD, 40 mg at 05/23/24 0817   multivitamin with minerals tablet 1 tablet, 1 tablet, Per Tube, Daily, Gonfa, Taye T, MD, 1 tablet at 05/23/24 0817   ondansetron  (ZOFRAN ) tablet 4 mg, 4 mg, Per Tube, Q6H PRN **OR** ondansetron  (ZOFRAN ) injection 4 mg, 4 mg, Intravenous, Q6H PRN, Greenwood, Howard F, RPH   polyethylene glycol (MIRALAX  / GLYCOLAX ) packet 17 g, 17 g, Per Tube, BID PRN, Gonfa, Taye T, MD, 17 g at 05/20/24 0608   senna-docusate (Senokot-S) tablet 1 tablet, 1 tablet, Per Tube, QHS PRN, Gonfa, Taye T, MD, 1 tablet at 05/22/24 2345   sodium chloride  flush (NS) 0.9 % injection 3 mL, 3 mL, Intravenous, Q12H, Fernand Prost, MD, 3 mL at 05/23/24 9180  Current Outpatient Medications:    acetaminophen  (TYLENOL ) 325 MG tablet, Take 650 mg by mouth every 6 (six) hours as needed., Disp: , Rfl:    albuterol  (PROVENTIL  HFA;VENTOLIN  HFA) 108 (90 Base) MCG/ACT inhaler, Inhale 2 puffs into the lungs every 6 (six) hours as needed for wheezing or shortness of breath., Disp: , Rfl:    diclofenac sodium (VOLTAREN) 1 % GEL, Apply 2 g topically 2 (two) times daily., Disp: , Rfl:    insulin  aspart (NOVOLOG ) 100 UNIT/ML FlexPen, Inject 0-6 Units into the skin 3 (three) times daily before meals using sliding scale., Disp: 15 mL, Rfl: 11   ipratropium (ATROVENT  HFA) 17 MCG/ACT inhaler, Inhale 2 puffs into the lungs every 6 (six) hours., Disp: , Rfl:     [START ON 05/24/2024] amLODipine  (NORVASC ) 10 MG tablet, Place 1 tablet (10 mg total) into feeding tube daily., Disp: , Rfl:    [START ON 05/24/2024] aspirin  81 MG chewable tablet, Place 1 tablet (81 mg total) into feeding tube daily., Disp: , Rfl:    [START ON 05/24/2024] atorvastatin  (LIPITOR ) 80 MG tablet, Place 1 tablet (80 mg total) into feeding tube daily., Disp: , Rfl:    budesonide-formoterol  (SYMBICORT) 80-4.5 MCG/ACT inhaler, Inhale 2 puffs into the lungs 2 (two) times daily., Disp: , Rfl:    [START ON 05/24/2024] clopidogrel  (PLAVIX ) 75 MG tablet, Place 1 tablet (75  mg total) into feeding tube daily., Disp: , Rfl:    cyanocobalamin (VITAMIN B12) 1000 MCG tablet, Place 1 tablet (1,000 mcg total) into feeding tube daily., Disp: , Rfl:    [START ON 05/24/2024] lisinopril  (ZESTRIL ) 40 MG tablet, Place 1 tablet (40 mg total) into feeding tube daily., Disp: , Rfl:    Melatonin 5 MG CAPS, 1 capsule (5 mg total) by PEG Tube route daily., Disp: , Rfl:    [START ON 05/24/2024] Multiple Vitamin (MULTIVITAMIN WITH MINERALS) TABS tablet, Place 1 tablet into feeding tube daily., Disp: , Rfl:    Nutritional Supplements (FEEDING SUPPLEMENT, OSMOLITE 1.5 CAL,) LIQD, Place 1,000 mLs into feeding tube continuous., Disp: , Rfl:    Protein (FEEDING SUPPLEMENT, PROSOURCE TF20,) liquid, Place 60 mLs into feeding tube 2 (two) times daily., Disp: , Rfl:    Water  For Irrigation, Sterile (FREE WATER ) SOLN, Place 100 mLs into feeding tube every 4 (four) hours., Disp: , Rfl:   "

## 2024-05-23 NOTE — TOC Progression Note (Signed)
 Transition of Care [facility]) - Progression Note    Patient Details  Name: MANAV PIEROTTI MRN: [id number] Date of Birth: Nov 29, 1960  Transition of Care [facility]) CM/SW Contact  Nathanael CHRISTELLA Ring, RN Phone Number: 05/23/2024, 10:49 AM  Clinical Narrative:    Plan is still for SNF, CIR reached out to son and son wanted to continue on with plan for [facility].  Provider reports that patient is medically cleared for discharge.  [facility] confirms that they are ready for him and have a bed.  Insurance authorization will be started.                     Expected Discharge Plan and Services                                               Social Drivers of Health (SDOH) Interventions SDOH Screenings   Food Insecurity: Patient Unable To Answer (05/12/2024)  Housing: Patient Unable To Answer (05/12/2024)  Transportation Needs: Patient Unable To Answer (05/12/2024)  Utilities: Patient Unable To Answer (05/12/2024)  Financial Resource Strain: High Risk (06/18/2023)   Received from [facility]  Social Connections: Patient Unable To Answer (05/12/2024)  Tobacco Use: High Risk (05/22/2024)    Readmission Risk Interventions     No data to display

## 2024-05-23 NOTE — Progress Notes (Signed)
 Physical Therapy Treatment Patient Details Name: Juan Harper MRN: [id number] DOB: 06-06-1960 Today's Date: 05/23/2024   History of Present Illness Pt is a 63 y.o. year old male presenting to the ED on 05/11/24 after having worsening slurred speech, weakness, confusion and falls. 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 PMH of HTN, HLD, DMII    PT Comments  Pt received sitting at end of bed attempting to exit, staff in room. Call placed to OT to join session in order to safely progress functionally with current balance and safety deficits. Pt required Min/ModA of 2 for transfers depending on height of surface and comprehension of task. Pt completed gait training with RW, close chair follow, with Min/ModA for safety and reducing Right lateral lean. Pt tolerated 60' x 2 with seated rest break. Overall good progress this date. Initial recs for CIR, however family requesting STR. Will continue to progress per POC acutely.    If plan is discharge home, recommend the following: A little help with walking and/or transfers;A little help with bathing/dressing/bathroom   Can travel by private vehicle        Equipment Recommendations  Other (comment) (Family requesting SNF, therefore DME will be determined post SNF stay)    Recommendations for Other Services       Precautions / Restrictions Precautions Precautions: Fall Recall of Precautions/Restrictions: Impaired Precaution/Restrictions Comments: PEG Restrictions Weight Bearing Restrictions Per Provider Order: No     Mobility  Bed Mobility               General bed mobility comments:  (Pt received sitting at the end of the bed attempting to exit)    Transfers Overall transfer level: Needs assistance Equipment used: Rolling walker (2 wheels) Transfers: Sit  to/from Stand Sit to Stand: Min assist, Mod assist, +2 physical assistance           General transfer comment:  (Min/ModA of 2 to stand from certain surfaces with Right lateral lean and heavy cues for hand placement)    Ambulation/Gait Ambulation/Gait assistance: Min assist Gait Distance (Feet): 60 Feet Assistive device: Rolling walker (2 wheels) Gait Pattern/deviations: Decreased stride length, Decreased dorsiflexion - left, Decreased weight shift to left, Shuffle, Ataxic, Staggering right Gait velocity: decreased     General Gait Details:  (Able to advance gait into hallway with chair follow for safety)   Stairs             Wheelchair Mobility     Tilt Bed    Modified Rankin (Stroke Patients Only)       Balance                                            Communication Communication Communication: Impaired Factors Affecting Communication: Difficulty expressing self;Reduced clarity of speech  Cognition Arousal: Alert Behavior During Therapy: Flat affect   PT - Cognitive impairments: No family/caregiver present to determine baseline                       PT - Cognition Comments: Pt is alert and able to answer questions however only truely oriented x 2. Pt has flat affect and poor historian of prior medical history Following commands:  Impaired Following commands impaired: Follows one step commands inconsistently    Cueing Cueing Techniques: Verbal cues, Tactile cues  Exercises      General Comments General comments (skin integrity, edema, etc.):  (PEG TF paused and disconnected for mobility.)      Pertinent Vitals/Pain Pain Assessment Pain Assessment: No/denies pain    Home Living                          Prior Function            PT Goals (current goals can now be found in the care plan section) Acute Rehab PT Goals Patient Stated Goal: none stated    Frequency    Min 2X/week      PT Plan       Co-evaluation PT/OT/SLP Co-Evaluation/Treatment: Yes Reason for Co-Treatment: For patient/therapist safety;To address functional/ADL transfers PT goals addressed during session: Mobility/safety with mobility OT goals addressed during session: ADL's and self-care      AM-PAC PT 6 Clicks Mobility   Outcome Measure  Help needed turning from your back to your side while in a flat bed without using bedrails?: A Lot Help needed moving from lying on your back to sitting on the side of a flat bed without using bedrails?: A Lot Help needed moving to and from a bed to a chair (including a wheelchair)?: A Lot Help needed standing up from a chair using your arms (e.g., wheelchair or bedside chair)?: A Lot Help needed to walk in hospital room?: A Lot Help needed climbing 3-5 steps with a railing? : A Lot 6 Click Score: 12    End of Session Equipment Utilized During Treatment: Gait belt Activity Tolerance: Patient tolerated treatment well Patient left: in chair;with call bell/phone within reach;with chair alarm set Nurse Communication: Mobility status PT Visit Diagnosis: Unsteadiness on feet (R26.81);Other abnormalities of gait and mobility (R26.89);Muscle weakness (generalized) (M62.81);Difficulty in walking, not elsewhere classified (R26.2)     Time: 8774-8749 PT Time Calculation (min) (ACUTE ONLY): 25 min  Charges:    $Therapeutic Activity: 8-22 mins PT General Charges $$ ACUTE PT VISIT: 1 Visit                    Darice Bohr, PTA  Darice JAYSON Bohr 05/23/2024, 2:23 PM

## 2024-05-23 NOTE — Progress Notes (Signed)
 Occupational Therapy Treatment Patient Details Name: Juan Harper MRN: [id number] DOB: [birth year] Today's Date: 05/23/2024   History of present illness Pt is a 63 y.o. year old male presenting to the ED on 05/11/24 after having worsening slurred speech, weakness, confusion and falls. 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 PMH of HTN, HLD, DMII   OT comments  Pt is seated EOB with PT on arrival. Denies pain and is agreeable to progress mobility this date. Required Min/Mod A +2 for safe transfer and mobility with close chair follow and frequent verb cues and hand/feet placement and safe transfer techniques with poor carryover noted. Continues to have R sided weakness with R lateral lean noted especially in seated position, cues required to weight shift and achieve midline. Edu on RUE AROM exercises to promote improved strength, however required frequent cues for initiation and proper form with anticipated poor carryover if given written HEP. He is able to functionally utilize RUE for ADL performance, but anticipate Mod A for LB ADL management d/t poor balance, safety and strength at this time. Pt left seated in recliner with all needs in place and will cont to require skilled acute OT services to maximize his safety and IND to return to PLOF. Recommended IPR, however pt's family opting to go to STR.       If plan is discharge home, recommend the following:  A lot of help with bathing/dressing/bathroom;Assist for transportation;Assistance with cooking/housework;Help with stairs or ramp for entrance;Direct supervision/assist for medications management;A little help with walking and/or transfers;A little help with bathing/dressing/bathroom   Equipment Recommendations  Other (comment) (defer)    Recommendations for Other Services       Precautions / Restrictions Precautions Precautions: Fall Recall of Precautions/Restrictions: Impaired Precaution/Restrictions Comments: PEG Restrictions Weight Bearing Restrictions Per Provider Order: No       Mobility Bed Mobility               General bed mobility comments: found seated EOB with PT on arrival    Transfers Overall transfer level: Needs assistance Equipment used: Rolling walker (2 wheels) Transfers: Sit to/from Stand Sit to Stand: Min assist, Mod assist, +2 physical assistance           General transfer comment: pt able to stand from EOB with Min A x2 and from recliner with Mod A +1 with frequent cues for hand/feet placement with poor carryover of safety techniques     Balance Overall balance assessment: Needs assistance Sitting-balance support: Feet supported Sitting balance-Leahy Scale: Fair     Standing balance support: Bilateral upper extremity supported, Reliant on assistive device for balance, During functional activity Standing balance-Leahy Scale: Poor Standing balance comment: reliance on RW in standing and +1 assist to safely ambulate                           ADL either performed or assessed with clinical judgement   ADL Overall ADL's : Needs assistance/impaired     Grooming: Wash/dry face;Set up;Sitting Grooming Details (indicate cue type and reason): using R dominant hand to wipe drool multiple times                               General ADL Comments: anticipate Mod  A for LB ADL management d/t weakness and balance deficits    Extremity/Trunk Assessment              Vision       Perception     Praxis     Communication Communication Communication: Impaired Factors Affecting Communication: Difficulty expressing self;Reduced clarity of speech   Cognition Arousal: Alert Behavior During Therapy: Flat affect                                 Following commands:  Impaired Following commands impaired: Follows one step commands inconsistently      Cueing   Cueing Techniques: Verbal cues, Tactile cues  Exercises Other Exercises Other Exercises: Edu pt on RUE ROM exercises to be performed 3x daily, however d/t pt's poor cognition/carryover anticipate he will not perform. RUE AROM shoulder flexion x10 reps during session to promote improved strength and prevent stiffness.    Shoulder Instructions       General Comments PEG tube disconnected by nurse for ambulation    Pertinent Vitals/ Pain       Pain Assessment Pain Assessment: No/denies pain Pain Intervention(s): Monitored during session  Home Living                                          Prior Functioning/Environment              Frequency  Min 2X/week        Progress Toward Goals  OT Goals(current goals can now be found in the care plan section)  Progress towards OT goals: Progressing toward goals  Acute Rehab OT Goals Patient Stated Goal: get stronger OT Goal Formulation: With patient Time For Goal Achievement: 05/26/24 Potential to Achieve Goals: Fair  Plan      Co-evaluation    PT/OT/SLP Co-Evaluation/Treatment: Yes Reason for Co-Treatment: For patient/therapist safety;To address functional/ADL transfers PT goals addressed during session: Mobility/safety with mobility OT goals addressed during session: ADL's and self-care      AM-PAC OT 6 Clicks Daily Activity     Outcome Measure   Help from another person eating meals?: A Little Help from another person taking care of personal grooming?: A Little Help from another person toileting, which includes using toliet, bedpan, or urinal?: A Lot Help from another person bathing (including washing, rinsing, drying)?: A Lot Help from another person to put on and taking off regular upper body clothing?: A Little Help from another person to put on and taking off regular lower body clothing?: A  Lot 6 Click Score: 15    End of Session Equipment Utilized During Treatment: Gait belt;Rolling walker (2 wheels)  OT Visit Diagnosis: Other abnormalities of gait and mobility (R26.89);Muscle weakness (generalized) (M62.81);Unsteadiness on feet (R26.81)   Activity Tolerance Patient tolerated treatment well   Patient Left with call bell/phone within reach;in chair;with chair alarm set   Nurse Communication Mobility status        Time: 8670-8645 OT Time Calculation (min): 25 min  Charges: OT General Charges $OT Visit: 1 Visit OT Treatments $Therapeutic Activity: 8-22 mins  Tawana Pasch Chrismon, OTR/L  05/23/2024, 3:16 PM   Kandice Schmelter E Chrismon 05/23/2024, 3:13 PM

## 2024-05-26 NOTE — Consult Note (Addendum)
 NEUROLOGY CONSULT NOTE   Date of service: 05/13/24 Patient Name: Juan Harper. MRN:  [id number] DOB:  [birth year] Chief Complaint: acute ischemic stroke Requesting Provider: Dr. Laurita  History of Present Illness   This is a 64 year old M with PMH of DM-2, HTN, HLD and cocaine use brought to ED due to slurred speech and difficulty speaking x24 hrs.  MRI brain showed acute nonhemorrhagic 10 mm infarct in the lateral aspect of thalamus and posterior limb of left IC, two acute punctate infarct in posterior limb of right IC and remote bilateral cerebral and brainstem CVA. Neurology is consulted for assistance with acute ischemic stroke  LKW: 24+ hrs prior to presentation Modified rankin score: 2-Slight disability-UNABLE to perform all activities but does not need assistance Patient was not a candidate for TNK or intervention  NIHSS components Score: Comment  1a Level of Conscious 0[x]  1[]  2[]  3[]      1b LOC Questions 0[]  1[]  2[x]       1c LOC Commands 0[x]  1[]  2[]       2 Best Gaze 0[x]  1[]  2[]       3 Visual 0[x]  1[]  2[]  3[]      4 Facial Palsy 0[]  1[x]  2[]  3[]      5a Motor Arm - left 0[x]  1[]  2[]  3[]  4[]  UN[]    5b Motor Arm - Right 0[]  1[x]  2[]  3[]  4[]  UN[]    6a Motor Leg - Left 0[]  1[x]  2[]  3[]  4[]  UN[]    6b Motor Leg - Right 0[]  1[x]  2[]  3[]  4[]  UN[]    7 Limb Ataxia 0[x]  1[]  2[]  UN[]      8 Sensory 0[x]  1[]  2[]  UN[]      9 Best Language 0[]  1[]  2[x]  3[]      10 Dysarthria 0[]  1[]  2[x]  UN[]      11 Extinct. and Inattention 0[x]  1[]  2[]       TOTAL:  10        ROS   Unable to ascertain due to unintelligible speech  Past History   Past Medical History:  Diagnosis Date   Arthritis    NECK/ RIGHT KNEE   Asthma    Bell's palsy    BPH (benign prostatic hyperplasia)    Cough    chronic   Diabetes mellitus without complication (HCC)    Edema    legs/feet   Fusion of spine of cervical region    PLANNED FOR APRIL   HOH (hard of hearing)    Hypertension    Orthopnea     Shortness of breath dyspnea    Sleep apnea    cpap   Wears dentures    partial upper and lower   Wheezing     Past Surgical History:  Procedure Laterality Date   BACK SURGERY     CATARACT EXTRACTION W/PHACO Right 07/31/2016   Procedure: CATARACT EXTRACTION PHACO AND INTRAOCULAR LENS PLACEMENT (IOC);  Surgeon: Adine Oneil Novak, MD;  Location: [facility] ORS;  Service: Ophthalmology;  Laterality: Right;  Lot # [id number] H US :00:26.9 AP%:6.8% CDE: 1.83   CATARACT EXTRACTION W/PHACO Left 06/24/2021   Procedure: CATARACT EXTRACTION PHACO AND INTRAOCULAR LENS PLACEMENT (IOC) LEFT DIABETIC;  Surgeon: Novak Adine Oneil, MD;  Location: [facility];  Service: Ophthalmology;  Laterality: Left;  3.35 0:23.9   COLONOSCOPY     NECK MASS EXCISION     PEG PLACEMENT N/A 05/22/2024   Procedure: INSERTION, PEG TUBE;  Surgeon: Maryruth Ole DASEN, MD;  Location: [facility] END[facility];  Service: Endoscopy;  Laterality: N/A;   PILONIDAL CYST EXCISION  Family History: Family History  Problem Relation Age of Onset   Hypertension Mother     Social History  reports that he has been smoking cigarettes. He has a 24 pack-year smoking history. He has never used smokeless tobacco. He reports current drug use. Drugs: Cocaine and Marijuana. He reports that he does not drink alcohol.  Allergies[1]  Medications  Current Medications[2]  Vitals   Vitals:   05/22/24 2335 05/23/24 0318 05/23/24 0758 05/23/24 1139  BP: 118/69 135/62 123/66 115/61  Pulse: 80 82 83 84  Resp: 16  20 20   Temp: 98 F (36.7 C) 98 F (36.7 C) 98 F (36.7 C) 97.9 F (36.6 C)  TempSrc: Oral Oral Oral Oral  SpO2: 96% 93% 90% 94%  Weight:  82.6 kg    Height:        Body mass index is 25.4 kg/m.   Physical Exam   Gen: patient lying in bed, NAD CV: extremities appear well-perfused Resp: normal WOB  Neurologic exam MS: alert, unable to answer orientation questions 2/2 unintelligible speech, does follow simple  commands Speech: severe dysarthria, no intelligible speech CN: PERRL, blinks to threat bilat, EOMI, sensation intact, R lower facial droop, hearing intact to voice Motor: no drift LUE, drift but not to bed in all other extremities Sensory: SILT Reflexes: 1+ symm with toes down bilat Coordination: UTA 2/2 difficulty following more complex commands Gait: deferred  Labs/Imaging/Neurodiagnostic studies   CBC:  Recent Labs  Lab 2024/06/04 0547  WBC 7.4  HGB 13.5  HCT 41.5  MCV 86.8  PLT 297   Basic Metabolic Panel:  Lab Results  Component Value Date   NA 136 05/23/2024   K 4.9 05/23/2024   CO2 26 05/23/2024   GLUCOSE 146 (H) 05/23/2024   BUN 51 (H) 05/23/2024   CREATININE 1.00 05/23/2024   CALCIUM  9.2 05/23/2024   GFRNONAA >60 05/23/2024   GFRAA >60 03/01/2017   Lipid Panel:  Lab Results  Component Value Date   LDLCALC 154 (H) 05/11/2024   HgbA1c:  Lab Results  Component Value Date   HGBA1C 8.1 (H) 05/11/2024   Urine Drug Screen:     Component Value Date/Time   LABOPIA NEGATIVE 05/14/2024 1121   COCAINSCRNUR POSITIVE (A) 05/14/2024 1121   COCAINSCRNUR POSITIVE (A) 02/06/2021 2344   LABBENZ NEGATIVE 05/14/2024 1121   AMPHETMU NEGATIVE 05/14/2024 1121   THCU NEGATIVE 05/14/2024 1121   LABBARB NEGATIVE 05/14/2024 1121    Alcohol Level     Component Value Date/Time   ETH <15 05/11/2024 1320   INR  Lab Results  Component Value Date   INR 1.0 05/11/2024   APTT  Lab Results  Component Value Date   APTT 29 05/11/2024   AED levels: No results found for: PHENYTOIN, ZONISAMIDE, LAMOTRIGINE, LEVETIRACETA  CT angio Head and Neck with contrast(Personally reviewed): Moderate to severe bilateral ICA stenosis of the cavernous segments, moderate left A2 stenosis, severe multifocal left P2 and distal right P3 stenosis.,  Focal moderate to severe stenosis involving the proximal left P1 segment and focal severe stenosis involving the distal left V4 segment.    MRI Brain(Personally reviewed): 1. Acute nonhemorrhagic 10mm infarct in the lateral aspect of the thalamus and posterior limb of the left internal capsule. 2. Two acute punctate infarcts in the posterior limb of the right internal capsule. 3. Moderate atrophy and confluent periventricular white matter changes extending to the subcortical regions bilaterally. This most likely reflects the sequelae of chronic microvascular ischemia. 4. Remote lacunar  infarcts in the inferior cerebellum bilaterally and remote ischemic changes extending into the brainstem.  TTE - pending  Stroke Labs     Component Value Date/Time   CHOL 212 (H) 05/11/2024 1320   TRIG 87 05/11/2024 1320   HDL 41 05/11/2024 1320   CHOLHDL 5.2 05/11/2024 1320   VLDL 17 05/11/2024 1320   LDLCALC 154 (H) 05/11/2024 1320    Lab Results  Component Value Date/Time   HGBA1C 8.1 (H) 05/11/2024 01:20 PM     ASSESSMENT   Juan Harper. is a 64 y.o. male with multiple cerebrovascular risk factors (DM2, HTN, HL) who presents with severe dysarthria and mild R sided weakness 2/2 acute ischemic stroke in the left internal capsule. Etiology of the stroke is favored to be small vessel disease. Unfortunately he has not made any progress in swallowing and he remains NPO. Plan to place NGT to be able to administer his medications.  RECOMMENDATIONS   - Patient is now outside the window for permissive HTN - goal normotension, avoid hypotension - TTE w/ bubble - ASA 81mg  daily + plavix  75mg  daily x90 days f/b ASA 81mg  daily monotherapy after that - Atorvastatin  80mg  daily - q4 hr neuro checks - STAT head CT for any change in neuro exam - Tele - PT/OT/SLP - Stroke education - Amb referral to neurology upon discharge   Neurology will continue to follow. ______________________________________________________________________    Bonney Elida CHRISTELLA Matthews, MD [facility]     [1]  Allergies Allergen Reactions    Bee Venom Itching  [2] No current facility-administered medications for this encounter.  Current Outpatient Medications:    acetaminophen  (TYLENOL ) 325 MG tablet, Take 650 mg by mouth every 6 (six) hours as needed., Disp: , Rfl:    albuterol  (PROVENTIL  HFA;VENTOLIN  HFA) 108 (90 Base) MCG/ACT inhaler, Inhale 2 puffs into the lungs every 6 (six) hours as needed for wheezing or shortness of breath., Disp: , Rfl:    diclofenac sodium (VOLTAREN) 1 % GEL, Apply 2 g topically 2 (two) times daily., Disp: , Rfl:    insulin  aspart (NOVOLOG ) 100 UNIT/ML FlexPen, Inject 0-6 Units into the skin 3 (three) times daily before meals using sliding scale., Disp: 15 mL, Rfl: 11   ipratropium (ATROVENT  HFA) 17 MCG/ACT inhaler, Inhale 2 puffs into the lungs every 6 (six) hours., Disp: , Rfl:    amLODipine  (NORVASC ) 10 MG tablet, Place 1 tablet (10 mg total) into feeding tube daily., Disp: , Rfl:    aspirin  81 MG chewable tablet, Place 1 tablet (81 mg total) into feeding tube daily., Disp: , Rfl:    atorvastatin  (LIPITOR ) 80 MG tablet, Place 1 tablet (80 mg total) into feeding tube daily., Disp: , Rfl:    budesonide-formoterol  (SYMBICORT) 80-4.5 MCG/ACT inhaler, Inhale 2 puffs into the lungs 2 (two) times daily., Disp: , Rfl:    clopidogrel  (PLAVIX ) 75 MG tablet, Place 1 tablet (75 mg total) into feeding tube daily., Disp: , Rfl:    cyanocobalamin (VITAMIN B12) 1000 MCG tablet, Place 1 tablet (1,000 mcg total) into feeding tube daily., Disp: , Rfl:    lisinopril  (ZESTRIL ) 40 MG tablet, Place 1 tablet (40 mg total) into feeding tube daily., Disp: , Rfl:    Melatonin 5 MG CAPS, 1 capsule (5 mg total) by PEG Tube route daily., Disp: , Rfl:    Multiple Vitamin (MULTIVITAMIN WITH MINERALS) TABS tablet, Place 1 tablet into feeding tube daily., Disp: , Rfl:    Nutritional Supplements (FEEDING SUPPLEMENT,  OSMOLITE 1.5 CAL,) LIQD, Place 1,000 mLs into feeding tube continuous., Disp: , Rfl:    Protein (FEEDING SUPPLEMENT,  PROSOURCE TF20,) liquid, Place 60 mLs into feeding tube 2 (two) times daily., Disp: , Rfl:    Water  For Irrigation, Sterile (FREE WATER ) SOLN, Place 100 mLs into feeding tube every 4 (four) hours., Disp: , Rfl:

## 2024-05-28 ENCOUNTER — Emergency Department

## 2024-05-28 ENCOUNTER — Observation Stay
Admission: EM | Admit: 2024-05-28 | Discharge: 2024-06-03 | Disposition: A | Discharge status: Skilled Nursing Facility | Attending: Internal Medicine | Admitting: Internal Medicine

## 2024-05-28 ENCOUNTER — Other Ambulatory Visit: Payer: Self-pay

## 2024-05-28 DIAGNOSIS — Z8673 Personal history of transient ischemic attack (TIA), and cerebral infarction without residual deficits: Secondary | ICD-10-CM | POA: Insufficient documentation

## 2024-05-28 DIAGNOSIS — R7401 Elevation of levels of liver transaminase levels: Secondary | ICD-10-CM | POA: Diagnosis not present

## 2024-05-28 DIAGNOSIS — K760 Fatty (change of) liver, not elsewhere classified: Secondary | ICD-10-CM | POA: Insufficient documentation

## 2024-05-28 DIAGNOSIS — N179 Acute kidney failure, unspecified: Secondary | ICD-10-CM | POA: Insufficient documentation

## 2024-05-28 DIAGNOSIS — F17210 Nicotine dependence, cigarettes, uncomplicated: Secondary | ICD-10-CM | POA: Insufficient documentation

## 2024-05-28 DIAGNOSIS — I5032 Chronic diastolic (congestive) heart failure: Secondary | ICD-10-CM | POA: Diagnosis present

## 2024-05-28 DIAGNOSIS — Z7982 Long term (current) use of aspirin: Secondary | ICD-10-CM | POA: Diagnosis not present

## 2024-05-28 DIAGNOSIS — I110 Hypertensive heart disease with heart failure: Secondary | ICD-10-CM | POA: Diagnosis not present

## 2024-05-28 DIAGNOSIS — R945 Abnormal results of liver function studies: Secondary | ICD-10-CM | POA: Insufficient documentation

## 2024-05-28 DIAGNOSIS — Z931 Gastrostomy status: Secondary | ICD-10-CM | POA: Insufficient documentation

## 2024-05-28 DIAGNOSIS — E1165 Type 2 diabetes mellitus with hyperglycemia: Secondary | ICD-10-CM | POA: Diagnosis not present

## 2024-05-28 DIAGNOSIS — I69391 Dysphagia following cerebral infarction: Secondary | ICD-10-CM | POA: Diagnosis not present

## 2024-05-28 DIAGNOSIS — R6510 Systemic inflammatory response syndrome (SIRS) of non-infectious origin without acute organ dysfunction: Secondary | ICD-10-CM

## 2024-05-28 DIAGNOSIS — Z6824 Body mass index (BMI) 24.0-24.9, adult: Secondary | ICD-10-CM | POA: Insufficient documentation

## 2024-05-28 DIAGNOSIS — R0602 Shortness of breath: Secondary | ICD-10-CM | POA: Diagnosis not present

## 2024-05-28 DIAGNOSIS — Z794 Long term (current) use of insulin: Secondary | ICD-10-CM | POA: Insufficient documentation

## 2024-05-28 DIAGNOSIS — E440 Moderate protein-calorie malnutrition: Secondary | ICD-10-CM | POA: Insufficient documentation

## 2024-05-28 DIAGNOSIS — N400 Enlarged prostate without lower urinary tract symptoms: Secondary | ICD-10-CM | POA: Diagnosis present

## 2024-05-28 DIAGNOSIS — R1310 Dysphagia, unspecified: Secondary | ICD-10-CM | POA: Diagnosis not present

## 2024-05-28 DIAGNOSIS — Z79899 Other long term (current) drug therapy: Secondary | ICD-10-CM | POA: Insufficient documentation

## 2024-05-28 DIAGNOSIS — Z7902 Long term (current) use of antithrombotics/antiplatelets: Secondary | ICD-10-CM | POA: Diagnosis not present

## 2024-05-28 DIAGNOSIS — R6511 Systemic inflammatory response syndrome (SIRS) of non-infectious origin with acute organ dysfunction: Secondary | ICD-10-CM | POA: Diagnosis not present

## 2024-05-28 DIAGNOSIS — R7989 Other specified abnormal findings of blood chemistry: Secondary | ICD-10-CM | POA: Diagnosis present

## 2024-05-28 DIAGNOSIS — E875 Hyperkalemia: Principal | ICD-10-CM | POA: Insufficient documentation

## 2024-05-28 DIAGNOSIS — E860 Dehydration: Secondary | ICD-10-CM | POA: Diagnosis not present

## 2024-05-28 DIAGNOSIS — E119 Type 2 diabetes mellitus without complications: Secondary | ICD-10-CM | POA: Diagnosis not present

## 2024-05-28 DIAGNOSIS — J45909 Unspecified asthma, uncomplicated: Secondary | ICD-10-CM | POA: Insufficient documentation

## 2024-05-28 DIAGNOSIS — I10 Essential (primary) hypertension: Secondary | ICD-10-CM | POA: Diagnosis present

## 2024-05-28 HISTORY — DX: Cerebral infarction, unspecified: I63.9

## 2024-05-28 LAB — COMPREHENSIVE METABOLIC PANEL WITH GFR
ALT: 362 U/L — ABNORMAL HIGH (ref 0–44)
AST: 236 U/L — ABNORMAL HIGH (ref 15–41)
Albumin: 3.7 g/dL (ref 3.5–5.0)
Alkaline Phosphatase: 130 U/L — ABNORMAL HIGH (ref 38–126)
Anion gap: 14 (ref 5–15)
BUN: 74 mg/dL — ABNORMAL HIGH (ref 8–23)
CO2: 25 mmol/L (ref 22–32)
Calcium: 9.6 mg/dL (ref 8.9–10.3)
Chloride: 95 mmol/L — ABNORMAL LOW (ref 98–111)
Creatinine, Ser: 1.39 mg/dL — ABNORMAL HIGH (ref 0.61–1.24)
GFR, Estimated: 56 mL/min — ABNORMAL LOW
Glucose, Bld: 290 mg/dL — ABNORMAL HIGH (ref 70–99)
Potassium: 6.5 mmol/L (ref 3.5–5.1)
Sodium: 133 mmol/L — ABNORMAL LOW (ref 135–145)
Total Bilirubin: 0.3 mg/dL (ref 0.0–1.2)
Total Protein: 7.6 g/dL (ref 6.5–8.1)

## 2024-05-28 LAB — URINALYSIS, W/ REFLEX TO CULTURE (INFECTION SUSPECTED)
Bacteria, UA: NONE SEEN
Bilirubin Urine: NEGATIVE
Glucose, UA: >=500 mg/dL — AB
Hgb urine dipstick: NEGATIVE
Ketones, ur: NEGATIVE mg/dL
Leukocytes,Ua: NEGATIVE
Nitrite: NEGATIVE
Protein, ur: NEGATIVE mg/dL
Specific Gravity, Urine: 1.015 (ref 1.005–1.030)
Squamous Epithelial / HPF: 0 /HPF (ref 0–5)
pH: 7 (ref 5.0–8.0)

## 2024-05-28 LAB — BASIC METABOLIC PANEL WITH GFR
Anion gap: 12 (ref 5–15)
BUN: 72 mg/dL — ABNORMAL HIGH (ref 8–23)
CO2: 24 mmol/L (ref 22–32)
Calcium: 9.4 mg/dL (ref 8.9–10.3)
Chloride: 101 mmol/L (ref 98–111)
Creatinine, Ser: 1.29 mg/dL — ABNORMAL HIGH (ref 0.61–1.24)
GFR, Estimated: >60 mL/min
Glucose, Bld: 100 mg/dL — ABNORMAL HIGH (ref 70–99)
Potassium: 5.8 mmol/L — ABNORMAL HIGH (ref 3.5–5.1)
Sodium: 137 mmol/L (ref 135–145)

## 2024-05-28 LAB — CBC WITH DIFFERENTIAL/PLATELET
Abs Immature Granulocytes: 0.05 K/uL (ref 0.00–0.07)
Basophils Absolute: 0.1 K/uL (ref 0.0–0.1)
Basophils Relative: 1 %
Eosinophils Absolute: 0.2 K/uL (ref 0.0–0.5)
Eosinophils Relative: 2 %
HCT: 34.8 % — ABNORMAL LOW (ref 39.0–52.0)
Hemoglobin: 11.7 g/dL — ABNORMAL LOW (ref 13.0–17.0)
Immature Granulocytes: 1 %
Lymphocytes Relative: 18 %
Lymphs Abs: 1.7 K/uL (ref 0.7–4.0)
MCH: 28.9 pg (ref 26.0–34.0)
MCHC: 33.6 g/dL (ref 30.0–36.0)
MCV: 85.9 fL (ref 80.0–100.0)
Monocytes Absolute: 1.3 K/uL — ABNORMAL HIGH (ref 0.1–1.0)
Monocytes Relative: 14 %
Neutro Abs: 6 K/uL (ref 1.7–7.7)
Neutrophils Relative %: 64 %
Platelets: 363 K/uL (ref 150–400)
RBC: 4.05 MIL/uL — ABNORMAL LOW (ref 4.22–5.81)
RDW: 12.6 % (ref 11.5–15.5)
WBC: 9.2 K/uL (ref 4.0–10.5)
nRBC: 0 % (ref 0.0–0.2)

## 2024-05-28 LAB — PROTIME-INR
INR: 1.1 (ref 0.8–1.2)
Prothrombin Time: 14.5 s (ref 11.4–15.2)

## 2024-05-28 LAB — LACTIC ACID, PLASMA: Lactic Acid, Venous: 1.6 mmol/L (ref 0.5–1.9)

## 2024-05-28 LAB — RESP PANEL BY RT-PCR (RSV, FLU A&B, COVID)  RVPGX2
Influenza A by PCR: NEGATIVE
Influenza B by PCR: NEGATIVE
Resp Syncytial Virus by PCR: NEGATIVE
SARS Coronavirus 2 by RT PCR: NEGATIVE

## 2024-05-28 LAB — CBG MONITORING, ED
Glucose-Capillary: 102 mg/dL — ABNORMAL HIGH (ref 70–99)
Glucose-Capillary: 134 mg/dL — ABNORMAL HIGH (ref 70–99)
Glucose-Capillary: 144 mg/dL — ABNORMAL HIGH (ref 70–99)
Glucose-Capillary: 148 mg/dL — ABNORMAL HIGH (ref 70–99)
Glucose-Capillary: 86 mg/dL (ref 70–99)

## 2024-05-28 MED ORDER — FREE WATER
100.0000 mL | Status: DC
  Administered 2024-05-29 – 2024-05-30 (×9): 100 mL
  Filled 2024-05-28 (×6): qty 100

## 2024-05-28 MED ORDER — AMLODIPINE BESYLATE 10 MG PO TABS
10.0000 mg | ORAL_TABLET | Freq: Every day | ORAL | Status: DC
  Administered 2024-05-29 – 2024-06-03 (×6): 10 mg
  Filled 2024-05-28 (×2): qty 1
  Filled 2024-05-28: qty 2
  Filled 2024-05-28 (×3): qty 1

## 2024-05-28 MED ORDER — INSULIN ASPART 100 UNIT/ML IJ SOLN
0.0000 [IU] | INTRAMUSCULAR | Status: DC
  Administered 2024-05-29 (×5): 3 [IU] via SUBCUTANEOUS
  Administered 2024-05-30: 2 [IU] via SUBCUTANEOUS
  Administered 2024-05-30: 3 [IU] via SUBCUTANEOUS
  Administered 2024-05-30: 2 [IU] via SUBCUTANEOUS
  Administered 2024-05-31 (×2): 3 [IU] via SUBCUTANEOUS
  Administered 2024-05-31: 5 [IU] via SUBCUTANEOUS
  Administered 2024-05-31 (×3): 3 [IU] via SUBCUTANEOUS
  Administered 2024-05-31: 2 [IU] via SUBCUTANEOUS
  Administered 2024-06-01: 5 [IU] via SUBCUTANEOUS
  Administered 2024-06-01 – 2024-06-02 (×2): 3 [IU] via SUBCUTANEOUS
  Administered 2024-06-02 (×2): 5 [IU] via SUBCUTANEOUS
  Administered 2024-06-02 – 2024-06-03 (×2): 2 [IU] via SUBCUTANEOUS
  Administered 2024-06-03 (×2): 3 [IU] via SUBCUTANEOUS
  Administered 2024-06-03 (×2): 2 [IU] via SUBCUTANEOUS
  Filled 2024-05-28 (×2): qty 2
  Filled 2024-05-28: qty 5
  Filled 2024-05-28 (×4): qty 3
  Filled 2024-05-28 (×2): qty 5
  Filled 2024-05-28 (×6): qty 3
  Filled 2024-05-28: qty 2
  Filled 2024-05-28: qty 5
  Filled 2024-05-28: qty 3
  Filled 2024-05-28: qty 2
  Filled 2024-05-28 (×3): qty 3
  Filled 2024-05-28: qty 2
  Filled 2024-05-28 (×2): qty 3
  Filled 2024-05-28: qty 5

## 2024-05-28 MED ORDER — CLOPIDOGREL BISULFATE 75 MG PO TABS
75.0000 mg | ORAL_TABLET | Freq: Every day | ORAL | Status: DC
  Administered 2024-05-28 – 2024-06-03 (×7): 75 mg
  Filled 2024-05-28 (×7): qty 1

## 2024-05-28 MED ORDER — ACETAMINOPHEN 650 MG RE SUPP: 650.0000 mg | Freq: Four times a day (QID) | RECTAL | Status: DC | PRN

## 2024-05-28 MED ORDER — PATIROMER SORBITEX CALCIUM 8.4 G PO PACK
8.4000 g | PACK | Freq: Every day | ORAL | Status: DC
  Administered 2024-05-29: 8.4 g via ORAL
  Filled 2024-05-28 (×2): qty 1

## 2024-05-28 MED ORDER — ONDANSETRON HCL 4 MG/2ML IJ SOLN: 4.0000 mg | Freq: Four times a day (QID) | INTRAMUSCULAR | Status: DC | PRN

## 2024-05-28 MED ORDER — HYDROCODONE-ACETAMINOPHEN 5-325 MG PO TABS
1.0000 | ORAL_TABLET | ORAL | Status: DC | PRN
  Administered 2024-05-29 – 2024-06-01 (×5): 2
  Filled 2024-05-28 (×5): qty 2

## 2024-05-28 MED ORDER — ENOXAPARIN SODIUM 40 MG/0.4ML IJ SOSY
40.0000 mg | PREFILLED_SYRINGE | INTRAMUSCULAR | Status: DC
  Administered 2024-05-28 – 2024-06-02 (×6): 40 mg via SUBCUTANEOUS
  Filled 2024-05-28 (×6): qty 0.4

## 2024-05-28 MED ORDER — OSMOLITE 1.2 CAL PO LIQD
1000.0000 mL | ORAL | Status: AC
  Administered 2024-05-29 – 2024-05-30 (×4): 1000 mL

## 2024-05-28 MED ORDER — SODIUM CHLORIDE 0.9 % IV BOLUS (SEPSIS)
1000.0000 mL | Freq: Once | INTRAVENOUS | Status: AC
  Administered 2024-05-28: 1000 mL via INTRAVENOUS

## 2024-05-28 MED ORDER — ONDANSETRON HCL 4 MG PO TABS
4.0000 mg | ORAL_TABLET | Freq: Four times a day (QID) | ORAL | Status: DC | PRN
  Administered 2024-05-31: 4 mg
  Filled 2024-05-28: qty 1

## 2024-05-28 MED ORDER — PATIROMER SORBITEX CALCIUM 8.4 G PO PACK
16.8000 g | PACK | Freq: Every day | ORAL | Status: DC
  Administered 2024-05-28: 16.8 g via ORAL
  Filled 2024-05-28: qty 2

## 2024-05-28 MED ORDER — ALBUTEROL SULFATE (2.5 MG/3ML) 0.083% IN NEBU: 2.5000 mg | INHALATION_SOLUTION | RESPIRATORY_TRACT | Status: DC | PRN

## 2024-05-28 MED ORDER — MELATONIN 5 MG PO TABS
5.0000 mg | ORAL_TABLET | Freq: Every day | ORAL | Status: DC
  Administered 2024-05-28 – 2024-06-02 (×6): 5 mg
  Filled 2024-05-28 (×6): qty 1

## 2024-05-28 MED ORDER — ADULT MULTIVITAMIN W/MINERALS CH
1.0000 | ORAL_TABLET | Freq: Every day | ORAL | Status: DC
  Administered 2024-05-29 – 2024-06-03 (×6): 1
  Filled 2024-05-28 (×6): qty 1

## 2024-05-28 MED ORDER — PROSOURCE TF20 ENFIT COMPATIBL EN LIQD
60.0000 mL | Freq: Two times a day (BID) | ENTERAL | Status: DC
  Administered 2024-05-29 – 2024-06-03 (×8): 60 mL
  Filled 2024-05-28 (×12): qty 60

## 2024-05-28 MED ORDER — ATORVASTATIN CALCIUM 80 MG PO TABS
80.0000 mg | ORAL_TABLET | Freq: Every day | ORAL | Status: DC
  Administered 2024-05-28 – 2024-05-29 (×2): 80 mg
  Filled 2024-05-28 (×2): qty 4

## 2024-05-28 MED ORDER — INSULIN ASPART 100 UNIT/ML IJ SOLN
10.0000 [IU] | Freq: Once | INTRAMUSCULAR | Status: AC
  Administered 2024-05-28: 10 [IU] via INTRAVENOUS
  Filled 2024-05-28: qty 10

## 2024-05-28 MED ORDER — ASPIRIN 81 MG PO CHEW
81.0000 mg | CHEWABLE_TABLET | Freq: Every day | ORAL | Status: DC
  Administered 2024-05-29 – 2024-06-03 (×6): 81 mg
  Filled 2024-05-28 (×6): qty 1

## 2024-05-28 MED ORDER — ACETAMINOPHEN 325 MG PO TABS
650.0000 mg | ORAL_TABLET | Freq: Four times a day (QID) | ORAL | Status: DC | PRN
  Administered 2024-06-02: 650 mg
  Filled 2024-05-28: qty 2

## 2024-05-28 MED ORDER — DEXTROSE 50 % IV SOLN
25.0000 g | Freq: Once | INTRAVENOUS | Status: AC
  Administered 2024-05-28: 25 g via INTRAVENOUS
  Filled 2024-05-28: qty 50

## 2024-05-28 MED ORDER — OSMOLITE 1.5 CAL PO LIQD: 1000.0000 mL | ORAL | Status: DC

## 2024-05-28 NOTE — Assessment & Plan Note (Signed)
 Hepatic steatosis on RUQ ultrasound 05/28/2024 Patient with newly abnormal LFTs, AST/ALT 236/362 with alk phos 130 Hepatitis viral panel will be sent

## 2024-05-28 NOTE — ED Notes (Signed)
Fall alarm in place

## 2024-05-28 NOTE — Assessment & Plan Note (Signed)
 Uncertain etiology, likely some dehydration as creatinine started improving with IV fluid. -Giving some more gentle IV fluid -Likely will need more free water  with G-tube on discharge.

## 2024-05-28 NOTE — Assessment & Plan Note (Addendum)
 Patient had a low-grade temp of 99.3, heart rate 123 soft blood pressure 91/67 No infectious etiology identified, UA, RVP and CXR all unremarkable and WBC normal Suspect dehydration Given dysphagia, monitor for aspiration Suspect dehydration Holding off on antibiotics Will evaluate for improvement with IV hydration only

## 2024-05-28 NOTE — Assessment & Plan Note (Signed)
 Dietary consult for resumption of tube feeds in the a.m.

## 2024-05-28 NOTE — Assessment & Plan Note (Addendum)
 Gastrostomy status Continue statins and antiplatelets via G-tube

## 2024-05-28 NOTE — ED Notes (Signed)
 Pt bed alarm was going off and this NT and several RN's ran to assist the pt. We found him at the edge of the bed when we arrived. Pt was redirected and put back in bed. His sheets, chux and brief were all changed and pt was repositioned in bed. Falls bundle in place. No other needs at this time

## 2024-05-28 NOTE — ED Triage Notes (Signed)
 Pt BIB [facility] from [facility] due to abnormal labs. Potassium 6.4, sodium 133. Tachycardic at 114-126, other vitals WDL. Alert at baseline (post stroke) per facility.   114 HR 110/66 99.3 AXILLARY

## 2024-05-28 NOTE — H&P (Signed)
 " History and Physical    Patient: Juan Harper. FMW:[id number] DOB: 1960-07-01 DOA: 05/28/2024 DOS: the patient was seen and examined on 05/28/2024 PCP: Sharl Delon NOVAK, NP  Patient coming from: Home  Chief Complaint:  Chief Complaint  Patient presents with   abnormal labs    HPI: Juan Harper. is a 64 y.o. 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  He was otherwise at baseline. In the ED, low-grade temp of 99.3, tachycardic to 123 and soft blood pressure of 91/67.  O2 sats in the mid 90s on room air. Labs notable for potassium 6.5, creatinine 1.39, with elevated LFTs, AST/ALT 236/362 with alk phos 130 and normal bili.CBC with normal WBC of 9.2.  Hemoglobin 11.7 down from 13.5 a couple weeks prior Respiratory panel negative for COVID flu and RSV and urinalysis unremarkable. EKG showed questionable a flutter at 103, possible second-degree heart block-changed from last EKG on 12/18 Chest x-ray clear RUQ ultrasound suggesting hepatic steatosis.  CBD 3 mm  Patient was treated with temporizing meds, insulin  and dextrose  and given a dose of Veltassa  via G-tube as well as an NS bolus Admission requested     Past Medical History:  Diagnosis Date   Arthritis    NECK/ RIGHT KNEE   Asthma    Bell's palsy    BPH (benign prostatic hyperplasia)    Cough    chronic   Diabetes mellitus without complication (HCC)    Edema    legs/feet   Fusion of spine of cervical region    PLANNED FOR APRIL   HOH (hard of hearing)    Hypertension    Orthopnea    Shortness of breath dyspnea    Sleep apnea    cpap   Stroke [facility])    Wears dentures    partial upper and lower   Wheezing    Past Surgical History:  Procedure Laterality Date   BACK SURGERY     CATARACT EXTRACTION W/PHACO Right 07/31/2016   Procedure: CATARACT  EXTRACTION PHACO AND INTRAOCULAR LENS PLACEMENT (IOC);  Surgeon: Adine Oneil Novak, MD;  Location: [facility] ORS;  Service: Ophthalmology;  Laterality: Right;  Lot # [id number] H US :00:26.9 AP%:6.8% CDE: 1.83   CATARACT EXTRACTION W/PHACO Left 06/24/2021   Procedure: CATARACT EXTRACTION PHACO AND INTRAOCULAR LENS PLACEMENT (IOC) LEFT DIABETIC;  Surgeon: Novak Adine Oneil, MD;  Location: [facility];  Service: Ophthalmology;  Laterality: Left;  3.35 0:23.9   COLONOSCOPY     NECK MASS EXCISION     PEG PLACEMENT N/A 05/22/2024   Procedure: INSERTION, PEG TUBE;  Surgeon: Maryruth Ole DASEN, MD;  Location: [facility] END[facility];  Service: Endoscopy;  Laterality: N/A;   PILONIDAL CYST EXCISION     Social History:  reports that he has been smoking cigarettes. He has a 24 pack-year smoking history. He has never used smokeless tobacco. He reports current drug use. Drugs: Cocaine and Marijuana. He reports that he does not drink alcohol.  Allergies[1]  Family History  Problem Relation Age of Onset   Hypertension Mother     Prior to Admission medications  Medication Sig Start Date End Date Taking? Authorizing Provider  acetaminophen  (TYLENOL ) 325 MG tablet Take 650 mg by mouth every 6 (six) hours as needed.    [provider]  albuterol  (PROVENTIL  HFA;VENTOLIN  HFA) 108 (90 Base) MCG/ACT inhaler Inhale 2  puffs into the lungs every 6 (six) hours as needed for wheezing or shortness of breath.    [provider]  amLODipine  (NORVASC ) 10 MG tablet Place 1 tablet (10 mg total) into feeding tube daily. 05/24/24   Laurita Pillion, MD  aspirin  81 MG chewable tablet Place 1 tablet (81 mg total) into feeding tube daily. 05/24/24   Laurita Pillion, MD  atorvastatin  (LIPITOR ) 80 MG tablet Place 1 tablet (80 mg total) into feeding tube daily. 05/24/24   Laurita Pillion, MD  budesonide-formoterol  [facility]) 80-4.5 MCG/ACT inhaler Inhale 2 puffs into the lungs 2 (two) times daily.    [provider]   clopidogrel  (PLAVIX ) 75 MG tablet Place 1 tablet (75 mg total) into feeding tube daily. 05/24/24 08/22/24  Laurita Pillion, MD  cyanocobalamin  (VITAMIN B12) 1000 MCG tablet Place 1 tablet (1,000 mcg total) into feeding tube daily. 05/23/24   Laurita Pillion, MD  diclofenac sodium (VOLTAREN) 1 % GEL Apply 2 g topically 2 (two) times daily.    [provider]  insulin  aspart (NOVOLOG ) 100 UNIT/ML FlexPen Inject 0-6 Units into the skin 3 (three) times daily before meals using sliding scale. 02/03/23   Suzanne Kirsch, MD  ipratropium (ATROVENT  HFA) 17 MCG/ACT inhaler Inhale 2 puffs into the lungs every 6 (six) hours.    [provider]  lisinopril  (ZESTRIL ) 40 MG tablet Place 1 tablet (40 mg total) into feeding tube daily. 05/24/24   Laurita Pillion, MD  Melatonin 5 MG CAPS 1 capsule (5 mg total) by PEG Tube route daily. 05/23/24   Laurita Pillion, MD  Multiple Vitamin (MULTIVITAMIN WITH MINERALS) TABS tablet Place 1 tablet into feeding tube daily. 05/24/24   Laurita Pillion, MD  Nutritional Supplements (FEEDING SUPPLEMENT, OSMOLITE 1.5 CAL,) LIQD Place 1,000 mLs into feeding tube continuous. 05/23/24   Laurita Pillion, MD  Protein (FEEDING SUPPLEMENT, PROSOURCE TF20,) liquid Place 60 mLs into feeding tube 2 (two) times daily. 05/23/24   Laurita Pillion, MD  Water  For Irrigation, Sterile (FREE WATER ) SOLN Place 100 mLs into feeding tube every 4 (four) hours. 05/23/24   Laurita Pillion, MD    Physical Exam: Vitals:   05/28/24 1749 05/28/24 1751 05/28/24 1918 05/28/24 1930  BP: 91/67  99/69 122/86  Pulse:   87 90  Resp:   20 (!) 23  Temp:   99.3 F (37.4 C)   TempSrc:   Axillary   SpO2: 94%  99% 98%  Weight:  88.1 kg    Height:  5' 11 (1.803 m)     Physical Exam Vitals and nursing note reviewed.  Constitutional:      General: He is not in acute distress. HENT:     Head: Normocephalic and atraumatic.  Cardiovascular:     Rate and Rhythm: Normal rate and regular rhythm.     Heart sounds: Normal  heart sounds.  Pulmonary:     Effort: Pulmonary effort is normal.     Breath sounds: Normal breath sounds.  Abdominal:     Palpations: Abdomen is soft.     Tenderness: There is no abdominal tenderness.  Neurological:     Mental Status: Mental status is at baseline.     Labs on Admission: I have personally reviewed following labs and imaging studies  CBC: Recent Labs  Lab 05/28/24 1757  WBC 9.2  NEUTROABS 6.0  HGB 11.7*  HCT 34.8*  MCV 85.9  PLT 363   Basic Metabolic Panel: Recent Labs  Lab 05/23/24 1033 05/28/24 1757  NA 136  133*  K 4.9 6.5*  CL 99 95*  CO2 26 25  GLUCOSE 146* 290*  BUN 51* 74*  CREATININE 1.00 1.39*  CALCIUM  9.2 9.6  PHOS 3.5  --    GFR: Estimated Creatinine Clearance: 56.4 mL/min (A) (by C-G formula based on SCr of 1.39 mg/dL (H)). Liver Function Tests: Recent Labs  Lab 05/28/24 1757  AST 236*  ALT 362*  ALKPHOS 130*  BILITOT 0.3  PROT 7.6  ALBUMIN 3.7   No results for input(s): LIPASE, AMYLASE in the last 168 hours. No results for input(s): AMMONIA in the last 168 hours. Coagulation Profile: Recent Labs  Lab 05/28/24 1757  INR 1.1   Cardiac Enzymes: No results for input(s): CKTOTAL, CKMB, CKMBINDEX, TROPONINI in the last 168 hours. BNP (last 3 results) No results for input(s): PROBNP in the last 8760 hours. HbA1C: No results for input(s): HGBA1C in the last 72 hours. CBG: Recent Labs  Lab 05/23/24 0743 05/23/24 1132 05/23/24 1548 05/28/24 2014 05/28/24 2104  GLUCAP 216* 153* 131* 134* 86   Lipid Profile: No results for input(s): CHOL, HDL, LDLCALC, TRIG, CHOLHDL, LDLDIRECT in the last 72 hours. Thyroid Function Tests: No results for input(s): TSH, T4TOTAL, FREET4, T3FREE, THYROIDAB in the last 72 hours. Anemia Panel: No results for input(s): VITAMINB12, FOLATE, FERRITIN, TIBC, IRON, RETICCTPCT in the last 72 hours. Urine analysis:    Component Value Date/Time    COLORURINE YELLOW (A) 05/28/2024 2115   APPEARANCEUR CLEAR (A) 05/28/2024 2115   LABSPEC 1.015 05/28/2024 2115   PHURINE 7.0 05/28/2024 2115   GLUCOSEU >=500 (A) 05/28/2024 2115   HGBUR NEGATIVE 05/28/2024 2115   BILIRUBINUR NEGATIVE 05/28/2024 2115   KETONESUR NEGATIVE 05/28/2024 2115   PROTEINUR NEGATIVE 05/28/2024 2115   NITRITE NEGATIVE 05/28/2024 2115   LEUKOCYTESUR NEGATIVE 05/28/2024 2115    Radiological Exams on Admission: US  ABDOMEN LIMITED RUQ (LIVER/GB) Result Date: 05/28/2024 EXAM: Right Upper Quadrant Abdominal Ultrasound 05/28/2024 08:24:02 PM TECHNIQUE: Real-time ultrasonography of the right upper quadrant of the abdomen was performed. COMPARISON: CT abdomen and pelvis 05/18/2024. CLINICAL HISTORY: Elevated LFTs. FINDINGS: LIVER: Increased hepatic parenchymal echotexture suggesting fatty infiltration. No focal lesions. No intrahepatic biliary ductal dilatation. Hepatopetal flow in the portal vein. BILIARY SYSTEM: No pericholecystic fluid or wall thickening. No cholelithiasis. Common bile duct is within normal limits measuring 3 mm. RIGHT KIDNEY: No hydronephrosis. No echogenic calculi. No mass. PANCREAS: Pancreas is not visualized due to overlying bowel gas. OTHER: No right upper quadrant ascites. IMPRESSION: 1. Increased hepatic parenchymal echotexture suggesting hepatic steatosis. No focal hepatic lesions. 2. Common bile duct measures 3 mm. Electronically signed by: Elsie Gravely MD 05/28/2024 08:35 PM EST RP Workstation: [id number]   DG Chest Port 1 View Result Date: 05/28/2024 CLINICAL DATA:  Possible sepsis. EXAM: PORTABLE CHEST 1 VIEW COMPARISON:  05/14/2024. FINDINGS: The heart size and mediastinal contours are within normal limits. There is atherosclerotic calcification of the aorta. No consolidation, effusion, or pneumothorax is seen. No acute osseous abnormality. IMPRESSION: No active disease. Electronically Signed   By: Leita Birmingham M.D.   On: 05/28/2024 18:10   Data  Reviewed for HPI: Relevant notes from primary care and specialist visits, past discharge summaries as available in EHR, including Care Everywhere. Prior diagnostic testing as pertinent to current admission diagnoses Updated medications and problem lists for reconciliation ED course, including vitals, labs, imaging, treatment and response to treatment Triage notes, nursing and pharmacy notes and ED provider's notes Notable results as noted above in HPI  Assessment and Plan: * Hyperkalemia Received Veltassa , will continue daily BMP every 4 and treat potassium if elevated Continuous cardiac monitoring  SIRS (systemic inflammatory response syndrome) (HCC) Patient had a low-grade temp of 99.3, heart rate 123 soft blood pressure 91/67 No infectious etiology identified, UA, RVP and CXR all unremarkable and WBC normal Suspect dehydration Given dysphagia, monitor for aspiration Suspect dehydration Holding off on antibiotics Will evaluate for improvement with IV hydration only  AKI (acute kidney injury) Uncertain etiology Received an NS bolus in the ED Continue to monitor and avoid nephrotoxins  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  HTN (hypertension) BP controlled.  Continue meds via G-tube  Diabetes mellitus without complication (HCC) Sliding scale insulin  coverage  Chronic diastolic CHF (congestive heart failure) (HCC) Continue GDMT via G-tube     DVT prophylaxis: Lovenox   Consults: none  Advance Care Planning:   Code Status: Prior   Family Communication: none  Disposition Plan: Back to previous home  environment  Severity of Illness: The appropriate patient status for this patient is OBSERVATION. Observation status is judged to be reasonable and necessary in order to provide the required intensity of service to ensure the patient's safety. The patient's presenting symptoms, physical exam findings, and initial radiographic and laboratory data in the context of their medical condition is felt to place them at decreased risk for further clinical deterioration. Furthermore, it is anticipated that the patient will be medically stable for discharge from the hospital within 2 midnights of admission.   Author: Delayne LULLA Solian, MD 05/28/2024 9:57 PM  For on call review [email or web address].      [1]  Allergies Allergen Reactions   Bee Venom Itching   "

## 2024-05-28 NOTE — Assessment & Plan Note (Signed)
 BP controlled.  Continue meds via G-tube

## 2024-05-28 NOTE — ED Notes (Signed)
 Fall risk bracelet and bed alarm is on pt. Bed is in lowest position.

## 2024-05-28 NOTE — Assessment & Plan Note (Signed)
 Sliding scale insulin  coverage

## 2024-05-28 NOTE — ED Provider Notes (Signed)
 "  [facility] Provider Note    Event Date/Time   First MD Initiated Contact with Patient 05/28/24 1751     (approximate)  History   Chief Complaint: abnormal labs  HPI  Juan Harper. is a 64 y.o. male with a past medical history of arthritis, asthma, diabetes, hypertension, CVA comes from his nursing facility due to abnormal lab work.  According to EMS from the patient's nursing facility they reported a potassium of 6.4 sodium of 133.  Here the patient is tachycardic on arrival around 120 bpm axillary temperature greater than 99.  Patient is awake he will look at you when you speak to him he would shake his head yes or no to answer most questions.  Physical Exam   Triage Vital Signs: ED Triage Vitals  Encounter Vitals Group     BP 05/28/24 1749 91/67     Girls Systolic BP Percentile --      Girls Diastolic BP Percentile --      Boys Systolic BP Percentile --      Boys Diastolic BP Percentile --      Pulse Rate 05/28/24 1748 (!) 123     Resp --      Temp --      Temp src --      SpO2 05/28/24 1748 (!) 4 %     Weight 05/28/24 1751 194 lb 3.6 oz (88.1 kg)     Height 05/28/24 1751 5' 11 (1.803 m)     Head Circumference --      Peak Flow --      Pain Score --      Pain Loc --      Pain Education --      Exclude from Growth Chart --     Most recent vital signs: Vitals:   05/28/24 1748 05/28/24 1749  BP:  91/67  Pulse: (!) 123   SpO2: (!) 4% 94%    General: Awake, no distress.  CV:  Good peripheral perfusion.  Somewhat irregular rhythm rate around 110 to 120 bpm. Resp:  Normal effort.  Equal breath sounds bilaterally.  Abd:  No distention.  Soft, nontender.  No rebound or guarding.  Feeding tube present.  Benign abdomen.  ED Results / Procedures / Treatments   EKG  EKG viewed and interpreted by myself shows irregular rhythm possibly atrial flutter or fibrillation at 103 bpm with a narrow QRS, normal axis large normal intervals with  nonspecific ST changes.  RADIOLOGY  Ultrasound shows normal gallbladder no CBD dilation.   MEDICATIONS ORDERED IN ED: Medications  sodium chloride  0.9 % bolus 1,000 mL (has no administration in time range)     IMPRESSION / MDM / ASSESSMENT AND PLAN / ED COURSE  I reviewed the triage vital signs and the nursing notes.  Patient's presentation is most consistent with acute presentation with potential threat to life or bodily function.  Patient presents to the emergency department for abnormal lab work from his nursing facility.  Patient has a history of a prior CVA and reportedly is at baseline from a mental state.  Patient noted to be hypotensive 91/67 hypertensive around 120 with an axillary temperature of 99.1.  Will check broad labs we will check cultures and a lactic acid we will IV hydrate and continue to closely monitor while awaiting for the results.   Patient's lab work today shows a normal respiratory panel.  Lactic acid of 1.6 CBC reassuring with a normal  white blood cell count.  Patient's chemistry however shows significant hyperkalemia with a potassium of 6.5 mild renal insufficiency with creatinine of 1.39 as well as LFT elevation.  Right upper quadrant ultrasound performed showing normal-appearing gallbladder no acute findings.  Patient receiving insulin  glucose and Veltassa  for his hyperkalemia patient receiving IV fluids.  Will require admission to the hospital service for further workup and treatment of his hyperkalemia.  CRITICAL CARE Performed by: Franky Moores   Total critical care time: 30 minutes  Critical care time was exclusive of separately billable procedures and treating other patients.  Critical care was necessary to treat or prevent imminent or life-threatening deterioration.  Critical care was time spent personally by me on the following activities: development of treatment plan with patient and/or surrogate as well as nursing, discussions with  consultants, evaluation of patient's response to treatment, examination of patient, obtaining history from patient or surrogate, ordering and performing treatments and interventions, ordering and review of laboratory studies, ordering and review of radiographic studies, pulse oximetry and re-evaluation of patient's condition.   FINAL CLINICAL IMPRESSION(S) / ED DIAGNOSES   Hyperkalemia Dehydration AKI    Note:  This document was prepared using Dragon voice recognition software and may include unintentional dictation errors.   Moores Franky, MD 05/28/24 2042  "

## 2024-05-28 NOTE — Assessment & Plan Note (Signed)
 Significantly elevated potassium on admission which has been improved to 5.1 s/p medical management and Veltassa . - Continue with IV fluid for another day -Continue with Veltassa  for another day -Monitor potassium

## 2024-05-28 NOTE — Assessment & Plan Note (Signed)
 Continue GDMT via G-tube

## 2024-05-29 DIAGNOSIS — N179 Acute kidney failure, unspecified: Secondary | ICD-10-CM

## 2024-05-29 DIAGNOSIS — E440 Moderate protein-calorie malnutrition: Secondary | ICD-10-CM | POA: Diagnosis not present

## 2024-05-29 DIAGNOSIS — I5032 Chronic diastolic (congestive) heart failure: Secondary | ICD-10-CM

## 2024-05-29 DIAGNOSIS — Z931 Gastrostomy status: Secondary | ICD-10-CM | POA: Diagnosis not present

## 2024-05-29 DIAGNOSIS — R7989 Other specified abnormal findings of blood chemistry: Secondary | ICD-10-CM | POA: Diagnosis not present

## 2024-05-29 DIAGNOSIS — Z8673 Personal history of transient ischemic attack (TIA), and cerebral infarction without residual deficits: Secondary | ICD-10-CM

## 2024-05-29 DIAGNOSIS — E875 Hyperkalemia: Secondary | ICD-10-CM

## 2024-05-29 DIAGNOSIS — N400 Enlarged prostate without lower urinary tract symptoms: Secondary | ICD-10-CM | POA: Diagnosis not present

## 2024-05-29 DIAGNOSIS — E860 Dehydration: Secondary | ICD-10-CM | POA: Diagnosis not present

## 2024-05-29 DIAGNOSIS — I10 Essential (primary) hypertension: Secondary | ICD-10-CM | POA: Diagnosis not present

## 2024-05-29 DIAGNOSIS — R6510 Systemic inflammatory response syndrome (SIRS) of non-infectious origin without acute organ dysfunction: Secondary | ICD-10-CM | POA: Diagnosis not present

## 2024-05-29 DIAGNOSIS — E119 Type 2 diabetes mellitus without complications: Secondary | ICD-10-CM

## 2024-05-29 LAB — BLOOD CULTURE ID PANEL (REFLEXED) - BCID2

## 2024-05-29 LAB — BASIC METABOLIC PANEL WITH GFR
Anion gap: 12 (ref 5–15)
Anion gap: 11 (ref 5–15)
Anion gap: 12 (ref 5–15)
BUN: 68 mg/dL — ABNORMAL HIGH (ref 8–23)
BUN: 62 mg/dL — ABNORMAL HIGH (ref 8–23)
BUN: 56 mg/dL — ABNORMAL HIGH (ref 8–23)
CO2: 23 mmol/L (ref 22–32)
CO2: 23 mmol/L (ref 22–32)
CO2: 24 mmol/L (ref 22–32)
Calcium: 9.9 mg/dL (ref 8.9–10.3)
Calcium: 9.7 mg/dL (ref 8.9–10.3)
Calcium: 9.6 mg/dL (ref 8.9–10.3)
Chloride: 99 mmol/L (ref 98–111)
Chloride: 101 mmol/L (ref 98–111)
Chloride: 101 mmol/L (ref 98–111)
Creatinine, Ser: 1.32 mg/dL — ABNORMAL HIGH (ref 0.61–1.24)
Creatinine, Ser: 1.21 mg/dL (ref 0.61–1.24)
Creatinine, Ser: 1.02 mg/dL (ref 0.61–1.24)
GFR, Estimated: 60 mL/min — ABNORMAL LOW
GFR, Estimated: >60 mL/min
GFR, Estimated: >60 mL/min
Glucose, Bld: 196 mg/dL — ABNORMAL HIGH (ref 70–99)
Glucose, Bld: 139 mg/dL — ABNORMAL HIGH (ref 70–99)
Glucose, Bld: 168 mg/dL — ABNORMAL HIGH (ref 70–99)
Potassium: 5.8 mmol/L — ABNORMAL HIGH (ref 3.5–5.1)
Potassium: 5.1 mmol/L (ref 3.5–5.1)
Potassium: 5 mmol/L (ref 3.5–5.1)
Sodium: 134 mmol/L — ABNORMAL LOW (ref 135–145)
Sodium: 136 mmol/L (ref 135–145)
Sodium: 136 mmol/L (ref 135–145)

## 2024-05-29 LAB — CBG MONITORING, ED
Glucose-Capillary: 196 mg/dL — ABNORMAL HIGH (ref 70–99)
Glucose-Capillary: 184 mg/dL — ABNORMAL HIGH (ref 70–99)
Glucose-Capillary: 154 mg/dL — ABNORMAL HIGH (ref 70–99)

## 2024-05-29 LAB — CBC
HCT: 33.1 % — ABNORMAL LOW (ref 39.0–52.0)
Hemoglobin: 11 g/dL — ABNORMAL LOW (ref 13.0–17.0)
MCH: 28.6 pg (ref 26.0–34.0)
MCHC: 33.2 g/dL (ref 30.0–36.0)
MCV: 86 fL (ref 80.0–100.0)
Platelets: 320 K/uL (ref 150–400)
RBC: 3.85 MIL/uL — ABNORMAL LOW (ref 4.22–5.81)
RDW: 12.8 % (ref 11.5–15.5)
WBC: 10.2 K/uL (ref 4.0–10.5)
nRBC: 0 % (ref 0.0–0.2)

## 2024-05-29 LAB — HEPATITIS PANEL, ACUTE
HCV Ab: NONREACTIVE
Hep A IgM: NONREACTIVE
Hep B C IgM: NONREACTIVE
Hepatitis B Surface Ag: NONREACTIVE

## 2024-05-29 LAB — HEPATIC FUNCTION PANEL
ALT: 436 U/L — ABNORMAL HIGH (ref 0–44)
AST: 288 U/L — ABNORMAL HIGH (ref 15–41)
Albumin: 3.4 g/dL — ABNORMAL LOW (ref 3.5–5.0)
Alkaline Phosphatase: 127 U/L — ABNORMAL HIGH (ref 38–126)
Bilirubin, Direct: 0.2 mg/dL (ref 0.0–0.2)
Indirect Bilirubin: 0.2 mg/dL — ABNORMAL LOW (ref 0.3–0.9)
Total Bilirubin: 0.4 mg/dL (ref 0.0–1.2)
Total Protein: 7.4 g/dL (ref 6.5–8.1)

## 2024-05-29 LAB — GLUCOSE, CAPILLARY
Glucose-Capillary: 165 mg/dL — ABNORMAL HIGH (ref 70–99)
Glucose-Capillary: 137 mg/dL — ABNORMAL HIGH (ref 70–99)

## 2024-05-29 MED ORDER — VITAMIN B-12 1000 MCG PO TABS
1000.0000 ug | ORAL_TABLET | Freq: Every day | ORAL | Status: DC
  Administered 2024-05-29 – 2024-06-03 (×6): 1000 ug
  Filled 2024-05-29 (×4): qty 1
  Filled 2024-05-29: qty 2
  Filled 2024-05-29: qty 1

## 2024-05-29 MED ORDER — HALOPERIDOL LACTATE 5 MG/ML IJ SOLN
2.0000 mg | Freq: Four times a day (QID) | INTRAMUSCULAR | Status: AC | PRN
  Administered 2024-05-29 (×2): 2 mg via INTRAVENOUS
  Filled 2024-05-29 (×2): qty 1

## 2024-05-29 MED ORDER — UMECLIDINIUM BROMIDE 62.5 MCG/ACT IN AEPB
1.0000 | INHALATION_SPRAY | Freq: Every day | RESPIRATORY_TRACT | Status: DC
  Administered 2024-05-30 – 2024-06-03 (×5): 1 via RESPIRATORY_TRACT
  Filled 2024-05-29 (×2): qty 7

## 2024-05-29 MED ORDER — DAPAGLIFLOZIN PROPANEDIOL 10 MG PO TABS
10.0000 mg | ORAL_TABLET | Freq: Every day | ORAL | Status: DC
  Administered 2024-05-30 – 2024-06-03 (×5): 10 mg via ORAL
  Filled 2024-05-29 (×5): qty 1

## 2024-05-29 MED ORDER — SODIUM CHLORIDE 0.9 % IV SOLN: INTRAVENOUS | Status: AC

## 2024-05-29 NOTE — Progress Notes (Signed)
 " Progress Note   Patient: Juan Harper. FMW:[id number] DOB: May 16, 1961 DOA: 05/28/2024     0 DOS: the patient was seen and examined on 05/29/2024   Brief hospital course: Partly taken from H&P.  Kuba Shepherd. is a 64 y.o. 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  He was otherwise at baseline.   On presentation vitals with tachycardia at 123, BP 91/67.  Labs with potassium of 6.5, creatinine 1.39,  elevated LFTs, AST/ALT 236/362 with alk phos 130 and normal bili.CBC with normal WBC of 9.2.  Hemoglobin 11.7 down from 13.5 a couple weeks prior,Respiratory panel negative for COVID flu and RSV and urinalysis unremarkable. EKG showed questionable a flutter at 103, possible second-degree heart block-changed from last EKG on 12/18. Chest x-ray negative for any acute abnormality. RUQ ultrasound suggesting hepatic steatosis. CBD 3 mm.  Patient received insulin  and dextrose , normal saline Palos and Veltassa  via G-tube.  1/4: Remained tachycardic with heart rate at 124, potassium improved to 5.8, creatinine 1.32.  Assessment and Plan: * Hyperkalemia Significantly elevated potassium on admission which has been improved to 5.1 s/p medical management and Veltassa . - Continue with IV fluid for another day -Continue with Veltassa  for another day -Monitor potassium  SIRS (systemic inflammatory response syndrome) (HCC) Likely secondary to dehydration and hyperkalemia, mild tachycardia, afebrile. No infectious etiology identified, UA, RVP and CXR all unremarkable and WBC normal Suspect dehydration Holding off on antibiotics Will evaluate for improvement with IV hydration only  AKI (acute kidney injury) Uncertain etiology, likely some dehydration as creatinine started improving with IV fluid. -Giving some more gentle IV  fluid -Likely will need more free water  with G-tube on discharge.  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 - Continue to monitor  Gastrostomy status (HCC) - Continue with tube feed  HTN (hypertension) BP controlled.  Continue meds via G-tube  Diabetes mellitus without complication (HCC) Sliding scale insulin  coverage  Chronic diastolic CHF (congestive heart failure) (HCC) Continue GDMT via G-tube   Subjective: Patient was seen and examined today.  Seems like at baseline of dysarthria and dysphagia.  No new concern.  Physical Exam: Vitals:   05/29/24 1027 05/29/24 1030 05/29/24 1133 05/29/24 1200  BP: 128/63 121/64  (!) 151/103  Pulse:  93    Resp:  11  16  Temp:   97.8 F (36.6 C)   TempSrc:   Oral   SpO2:  92%    Weight:      Height:       General.  Chronic ill-appearing gentleman, in no acute distress. Pulmonary.  Lungs clear bilaterally, normal respiratory effort. CV.  Regular rate and rhythm, no JVD, rub or murmur. Abdomen.  Soft, nontender, nondistended, BS positive. CNS.  Alert, chronic left-sided deficit, no new focal deficit Extremities.  No edema, no cyanosis, pulses intact and symmetrical.  Data Reviewed: Prior data reviewed  Family Communication: Talked with son on phone.  Disposition: Status is: Observation The patient will require care spanning > 2 midnights and should be moved to inpatient because: Severity of illness  Planned Discharge Destination: Skilled nursing facility  DVT prophylaxis.  Lovenox  Time spent: 50 minutes  This record  has been created using Conservation officer, historic buildings. Errors have been sought and corrected,but may not always be located. Such creation errors do not  reflect on the standard of care.   Author: Amaryllis Dare, MD 05/29/2024 2:22 PM  For on call review [email or web address].  "

## 2024-05-29 NOTE — Hospital Course (Signed)
 SABRA

## 2024-05-29 NOTE — Hospital Course (Addendum)
 Partly taken from H&P.  Juan Harper. is a 64 y.o. 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  He was otherwise at baseline.   On presentation vitals with tachycardia at 123, BP 91/67.  Labs with potassium of 6.5, creatinine 1.39,  elevated LFTs, AST/ALT 236/362 with alk phos 130 and normal bili.CBC with normal WBC of 9.2.  Hemoglobin 11.7 down from 13.5 a couple weeks prior,Respiratory panel negative for COVID flu and RSV and urinalysis unremarkable. EKG showed questionable a flutter at 103, possible second-degree heart block-changed from last EKG on 12/18. Chest x-ray negative for any acute abnormality. RUQ ultrasound suggesting hepatic steatosis. CBD 3 mm.  Patient received insulin  and dextrose , normal saline and Veltassa  via G-tube.  1/4: Hemodynamically stable, potassium improved to 5.8>>5.1, creatinine improved to 1.21 with baseline around 1.    1/5: Remained hemodynamically stable with worsening hyperkalemia again, potassium at 5.2, hyponatremia improved.  Adjusting tube feed for low potassium formula, adding bowel regimen and increasing the dose of Veltassa  to 16 g. Hepatic function started improving-holding statin.  1/6: Remained hemodynamically stable, potassium normalized.  PT and OT are recommending SNF, apparently required another insurance authorization in order to go back to his SNF.  1/7: Remained hemodynamically stable with improving transaminitis.  Obtained insurance authorization to go back to SNF.  Apparently a sitter was placed due to some agitation overnight so SNF will not take him back today.  Sitter was discontinued.  1/8: Hemodynamically stable, potassium at 5.2, increasing free water .  Feeding formula switched with Nepro.  1/9: Remained hemodynamically stable.  Potassium at 4.9.  Patient is being  discharged on Nepro and Prosource.  He will need a close monitoring of his potassium, should be checked every 2 to 3 days until stable.  Avoid high potassium formula.  Free water  intake was also increased.  We continued Kayexalate which was recently started at facility, they can discontinue Kayexalate if potassium remained normal or can use as needed.  Patient should avoid constipation and will continue on current medications.  Need to have a close follow-up with his providers for further assistance.

## 2024-05-29 NOTE — Plan of Care (Signed)
  Problem: Education: Goal: Ability to describe self-care measures that may prevent or decrease complications (Diabetes Survival Skills Education) will improve Outcome: Progressing Goal: Individualized Educational Video(s) Outcome: Progressing   Problem: Education: Goal: Individualized Educational Video(s) Outcome: Progressing   Problem: Coping: Goal: Ability to adjust to condition or change in health will improve Outcome: Progressing   Problem: Fluid Volume: Goal: Ability to maintain a balanced intake and output will improve Outcome: Progressing

## 2024-05-29 NOTE — Progress Notes (Signed)
" ° °  Brief Progress Note   _____________________________________________________________________________________________________________  Patient Name: Juan Harper. Patient DOB: Nov 23, 1960 Date: @TODAY @      Data: Reviewed labs, notes, VS.    Action: No action needed at this time.      Response:    _____________________________________________________________________________________________________________  [facility] RN Expeditor Sharolyn JONETTA Batman Please contact us  directly via secure chat (search for [facility]) or by calling us  at [phone] [facility]).  "

## 2024-05-29 NOTE — Progress Notes (Signed)
 PHARMACY - PHYSICIAN COMMUNICATION CRITICAL VALUE ALERT - BLOOD CULTURE IDENTIFICATION (BCID)  Juan Harper. is an 64 y.o. male who presented to [facility] on 05/28/2024 from rehab for hyperkalemia on routine outpatient labs.  Assessment:  1/4 bottles growing staph epidermidis (mecA positive)   Name of physician (or Provider) Contacted: Dr. Caleen  Current antibiotics: None  Changes to prescribed antibiotics recommended:  Per Dr. Caleen, will consider this a contaminant and hold off on starting antibiotics at this time.  Results for orders placed or performed during the hospital encounter of 05/28/24  Blood Culture ID Panel (Reflexed) (Collected: 05/28/2024  6:06 PM)  Result Value Ref Range   Enterococcus faecalis NOT DETECTED NOT DETECTED   Enterococcus Faecium NOT DETECTED NOT DETECTED   Listeria monocytogenes NOT DETECTED NOT DETECTED   Staphylococcus species DETECTED (A) NOT DETECTED   Staphylococcus aureus (BCID) NOT DETECTED NOT DETECTED   Staphylococcus epidermidis DETECTED (A) NOT DETECTED   Staphylococcus lugdunensis NOT DETECTED NOT DETECTED   Streptococcus species NOT DETECTED NOT DETECTED   Streptococcus agalactiae NOT DETECTED NOT DETECTED   Streptococcus pneumoniae NOT DETECTED NOT DETECTED   Streptococcus pyogenes NOT DETECTED NOT DETECTED   A.calcoaceticus-baumannii NOT DETECTED NOT DETECTED   Bacteroides fragilis NOT DETECTED NOT DETECTED   Enterobacterales NOT DETECTED NOT DETECTED   Enterobacter cloacae complex NOT DETECTED NOT DETECTED   Escherichia coli NOT DETECTED NOT DETECTED   Klebsiella aerogenes NOT DETECTED NOT DETECTED   Klebsiella oxytoca NOT DETECTED NOT DETECTED   Klebsiella pneumoniae NOT DETECTED NOT DETECTED   Proteus species NOT DETECTED NOT DETECTED   Salmonella species NOT DETECTED NOT DETECTED   Serratia marcescens NOT DETECTED NOT DETECTED   Haemophilus influenzae NOT DETECTED NOT DETECTED   Neisseria meningitidis NOT DETECTED NOT DETECTED    Pseudomonas aeruginosa NOT DETECTED NOT DETECTED   Stenotrophomonas maltophilia NOT DETECTED NOT DETECTED   Candida albicans NOT DETECTED NOT DETECTED   Candida auris NOT DETECTED NOT DETECTED   Candida glabrata NOT DETECTED NOT DETECTED   Candida krusei NOT DETECTED NOT DETECTED   Candida parapsilosis NOT DETECTED NOT DETECTED   Candida tropicalis NOT DETECTED NOT DETECTED   Cryptococcus neoformans/gattii NOT DETECTED NOT DETECTED   Methicillin resistance mecA/C DETECTED (A) NOT DETECTED    Thank you for involving pharmacy in this patient's care.   Damien Napoleon, PharmD Clinical Pharmacist 05/29/2024 3:40 PM

## 2024-05-29 NOTE — Progress Notes (Signed)
 This RN paged Dr. Caleen to verify that patient may wear male purewick device due to incontinence. Dr. Caleen agreed to [facility] being initiated. See flowsheets.

## 2024-05-30 DIAGNOSIS — E875 Hyperkalemia: Secondary | ICD-10-CM | POA: Diagnosis not present

## 2024-05-30 DIAGNOSIS — E440 Moderate protein-calorie malnutrition: Secondary | ICD-10-CM | POA: Insufficient documentation

## 2024-05-30 DIAGNOSIS — N179 Acute kidney failure, unspecified: Secondary | ICD-10-CM | POA: Diagnosis not present

## 2024-05-30 DIAGNOSIS — R6510 Systemic inflammatory response syndrome (SIRS) of non-infectious origin without acute organ dysfunction: Secondary | ICD-10-CM | POA: Diagnosis not present

## 2024-05-30 DIAGNOSIS — E860 Dehydration: Secondary | ICD-10-CM | POA: Diagnosis not present

## 2024-05-30 LAB — BASIC METABOLIC PANEL WITH GFR
Anion gap: 10 (ref 5–15)
Anion gap: 9 (ref 5–15)
BUN: 47 mg/dL — ABNORMAL HIGH (ref 8–23)
BUN: 38 mg/dL — ABNORMAL HIGH (ref 8–23)
CO2: 23 mmol/L (ref 22–32)
CO2: 24 mmol/L (ref 22–32)
Calcium: 9.5 mg/dL (ref 8.9–10.3)
Calcium: 9.4 mg/dL (ref 8.9–10.3)
Chloride: 104 mmol/L (ref 98–111)
Chloride: 101 mmol/L (ref 98–111)
Creatinine, Ser: 0.99 mg/dL (ref 0.61–1.24)
Creatinine, Ser: 0.9 mg/dL (ref 0.61–1.24)
GFR, Estimated: >60 mL/min
GFR, Estimated: >60 mL/min
Glucose, Bld: 186 mg/dL — ABNORMAL HIGH (ref 70–99)
Glucose, Bld: 171 mg/dL — ABNORMAL HIGH (ref 70–99)
Potassium: 5.2 mmol/L — ABNORMAL HIGH (ref 3.5–5.1)
Potassium: 5.1 mmol/L (ref 3.5–5.1)
Sodium: 136 mmol/L (ref 135–145)
Sodium: 135 mmol/L (ref 135–145)

## 2024-05-30 LAB — GLUCOSE, CAPILLARY
Glucose-Capillary: 120 mg/dL — ABNORMAL HIGH (ref 70–99)
Glucose-Capillary: 120 mg/dL — ABNORMAL HIGH (ref 70–99)
Glucose-Capillary: 128 mg/dL — ABNORMAL HIGH (ref 70–99)
Glucose-Capillary: 136 mg/dL — ABNORMAL HIGH (ref 70–99)
Glucose-Capillary: 139 mg/dL — ABNORMAL HIGH (ref 70–99)
Glucose-Capillary: 181 mg/dL — ABNORMAL HIGH (ref 70–99)

## 2024-05-30 LAB — HEPATIC FUNCTION PANEL
ALT: 299 U/L — ABNORMAL HIGH (ref 0–44)
AST: 126 U/L — ABNORMAL HIGH (ref 15–41)
Albumin: 3.2 g/dL — ABNORMAL LOW (ref 3.5–5.0)
Alkaline Phosphatase: 115 U/L (ref 38–126)
Bilirubin, Direct: 0.1 mg/dL (ref 0.0–0.2)
Indirect Bilirubin: 0.2 mg/dL — ABNORMAL LOW (ref 0.3–0.9)
Total Bilirubin: 0.3 mg/dL (ref 0.0–1.2)
Total Protein: 6.9 g/dL (ref 6.5–8.1)

## 2024-05-30 MED ORDER — POLYETHYLENE GLYCOL 3350 17 G PO PACK
17.0000 g | PACK | Freq: Every day | ORAL | Status: DC
  Administered 2024-05-30 – 2024-06-03 (×5): 17 g
  Filled 2024-05-30 (×5): qty 1

## 2024-05-30 MED ORDER — PATIROMER SORBITEX CALCIUM 8.4 G PO PACK
16.8000 g | PACK | Freq: Every day | ORAL | Status: DC
  Administered 2024-05-30 – 2024-06-03 (×5): 16.8 g via ORAL
  Filled 2024-05-30 (×5): qty 2

## 2024-05-30 MED ORDER — OSMOLITE 1.5 CAL PO LIQD
1000.0000 mL | ORAL | Status: DC
  Administered 2024-05-30 – 2024-06-02 (×2): 1000 mL

## 2024-05-30 MED ORDER — FREE WATER
200.0000 mL | Status: DC
  Administered 2024-05-30 – 2024-06-02 (×18): 200 mL

## 2024-05-30 MED ORDER — SENNOSIDES 8.8 MG/5ML PO SYRP
10.0000 mL | ORAL_SOLUTION | Freq: Every evening | ORAL | Status: DC | PRN
  Administered 2024-06-03: 10 mL
  Filled 2024-05-30 (×3): qty 10

## 2024-05-30 NOTE — Progress Notes (Signed)
 " Progress Note   Patient: Juan Harper. FMW:[id number] DOB: March 23, 1961 DOA: 05/28/2024     0 DOS: the patient was seen and examined on 05/30/2024   Brief hospital course: Partly taken from H&P.  Juan Harper. is a 64 y.o. 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  He was otherwise at baseline.   On presentation vitals with tachycardia at 123, BP 91/67.  Labs with potassium of 6.5, creatinine 1.39,  elevated LFTs, AST/ALT 236/362 with alk phos 130 and normal bili.CBC with normal WBC of 9.2.  Hemoglobin 11.7 down from 13.5 a couple weeks prior,Respiratory panel negative for COVID flu and RSV and urinalysis unremarkable. EKG showed questionable a flutter at 103, possible second-degree heart block-changed from last EKG on 12/18. Chest x-ray negative for any acute abnormality. RUQ ultrasound suggesting hepatic steatosis. CBD 3 mm.  Patient received insulin  and dextrose , normal saline Palos and Veltassa  via G-tube.  1/4: Hemodynamically stable, potassium improved to 5.8>>5.1, creatinine improved to 1.21 with baseline around 1.    1/5: Remained hemodynamically stable with worsening hyperkalemia again, potassium at 5.2, hyponatremia improved.  Adjusting tube feed for low potassium formula, adding bowel regimen and increasing the dose of Veltassa  to 16 g. Hepatic function started improving-holding statin  Assessment and Plan: * Hyperkalemia Significantly elevated potassium on admission s/p medical management and Veltassa . Potassium started trending up again after improvement, currently at 5.2 -Adjusting feeding tube formula to low potassium -Increasing the dose of Veltassa  to 16 g -Monitor potassium  SIRS (systemic inflammatory response syndrome) (HCC) Likely secondary to dehydration and hyperkalemia, mild tachycardia,  afebrile. No infectious etiology identified, UA, RVP and CXR all unremarkable and WBC normal Suspect dehydration Holding off on antibiotics Will evaluate for improvement with IV hydration only  AKI (acute kidney injury) Uncertain etiology, likely some dehydration as creatinine started improving with IV fluid. 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 -Holding statin - Continue to monitor  Gastrostomy status (HCC) - Continue with tube feed  HTN (hypertension) BP controlled.  Continue meds via G-tube  Diabetes mellitus without complication (HCC) CBG within goal Continue sliding scale insulin  coverage  Chronic diastolic CHF (congestive heart failure) (HCC) Continue GDMT via G-tube   Subjective: Patient was seen and examined today.  No new concern.  Physical Exam: Vitals:   05/30/24 0424 05/30/24 0500 05/30/24 0907 05/30/24 1200  BP: 104/76  114/68 128/71  Pulse: 82  81 84  Resp: 18  17 17   Temp: 98.7 F (37.1 C)  97.9 F (36.6 C) 97.7 F (36.5 C)  TempSrc:      SpO2: 97%  99% 98%  Weight:  88.8 kg    Height:       General. Frail and ill-appearing gentleman, in no acute distress. Pulmonary.  Lungs clear bilaterally, normal respiratory effort. CV.  Regular rate and rhythm, no JVD, rub or murmur. Abdomen.  Soft, nontender, nondistended, BS positive. CNS.  Awake with baseline dysarthria/aphasia.  No new focal neurologic deficit.  Residual left-sided deficit Extremities.  No edema, no cyanosis, pulses intact and symmetrical.   Data Reviewed: Prior data  reviewed  Family Communication: Talked with son on phone.  Disposition: Status is:  Observation The patient will require care spanning > 2 midnights and should be moved to inpatient because: Severity of illness  Planned Discharge Destination: Skilled nursing facility  DVT prophylaxis.  Lovenox  Time spent: 50 minutes  This record has been created using Conservation officer, historic buildings. Errors have been sought and corrected,but may not always be located. Such creation errors do not reflect on the standard of care.   Author: Amaryllis Dare, MD 05/30/2024 3:10 PM  For on call review [email or web address].  "

## 2024-05-30 NOTE — TOC Initial Note (Addendum)
 Transition of Care (TOC) - Initial/Assessment Note    Patient Details  Name: Juan Harper. MRN: [id number] Date of Birth: 09-Jan-1961  Transition of Care [facility]) CM/SW Contact:    Alfonso Rummer, LCSW Phone Number: 05/30/2024, 3:39 PM  Clinical Narrative:                  LCSW A . Rummer met with pt in room 238. Pt is from [facility]. [facility] at [facility]. Pt is currently in [facility] snf days with [facility], he only used approx 6 days before coming back to [facility]. LCSW A Jamario Colina contact Ashley with [facility] to inform pt is returning 05/31/2024 due to being medically cleared. Ashley reports the facility is possiblity locating another snf for patient due to Juan Harper can not participate in short term rehab. LCSW A Rummer discuss this with TOC leadership. Advised to complete a new fl2 and new insurance auth for patient.   Update 5:00pm [facility] called back and report they can not accept pt back to unable to meet patients needs.        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) 05/28/2024   Gastrostomy status (HCC) 05/28/2024   SIRS (systemic inflammatory response syndrome) (HCC) 05/28/2024   Protein-calorie malnutrition, severe 05/14/2024   Cerebral infarction (HCC) 05/11/2024   Fall at home, initial encounter 02/07/2021   Syncope  02/07/2021   Right hip pain 02/07/2021   BPH (benign prostatic hyperplasia)    Chronic diastolic CHF (congestive heart failure) (HCC)    AKI (acute kidney injury) 02/06/2021   Acute kidney injury 01/06/2018   Hyperkalemia 01/06/2018   Acute renal failure (ARF) 02/26/2017   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   Sleep apnea 11/24/2016   Diabetes mellitus without complication (HCC) 11/24/2016   HTN (hypertension) 11/24/2016   Dizziness 11/24/2016   Acute on chronic renal failure 09/19/2016   Cervical disc disease with myelopathy 09/07/2016   Simple chronic bronchitis (HCC) 07/16/2016   Tobacco use 07/16/2016   Closed fracture of multiple ribs of left side 02/07/2016   BPH with obstruction/lower urinary tract symptoms 05/23/2014   Elevated PSA 05/23/2014   Erectile dysfunction 05/23/2014   Chest pain 12/10/2010   PCP:  Sharl Delon NOVAK, NP Pharmacy:   [facility] - [place], [place] - [address] [place] [address] Phone: [phone] Fax: [phone]  [id number] - [place], [place] -  [address] [place] [address] Phone: [phone] Fax: [phone]  [facility] [address] [place] [address] Phone: [phone] Fax: [phone]     Social Drivers of Health (SDOH) Social History: SDOH Screenings   Food Insecurity: Patient Unable To Answer (05/29/2024)  Housing: Patient Unable To Answer (05/29/2024)  Transportation Needs: Patient Unable To Answer (05/29/2024)  Utilities: Patient Unable To Answer (05/29/2024)  Financial Resource Strain: High Risk (06/18/2023)   Received from [facility]  Social Connections: Patient Unable To Answer (05/29/2024)  Tobacco Use: High Risk (05/28/2024)   SDOH Interventions:     Readmission Risk Interventions     No data to display

## 2024-05-30 NOTE — Progress Notes (Addendum)
 Initial Nutrition Assessment  DOCUMENTATION CODES:   Non-severe (moderate) malnutrition in context of social or environmental circumstances  INTERVENTION:   -D/c Osmolite 1.2 -TF via PEG:   Osmolite 1.5 @ 60 ml/hr   60 ml Prosource TF20 BID     200 ml free water  flush every 4 hours   Tube feeding regimen provides 2320 kcal (100% of needs), 130 grams of protein, and 1097 ml of H2O. Total free water : 2297 ml daily  NUTRITION DIAGNOSIS:   Moderate Malnutrition related to social / environmental circumstances as evidenced by mild fat depletion, moderate fat depletion, mild muscle depletion, moderate muscle depletion.  GOAL:   Patient will meet greater than or equal to 90% of their needs  MONITOR:   TF tolerance, Diet advancement  REASON FOR ASSESSMENT:   Consult Assessment of nutrition requirement/status  ASSESSMENT:   64 y.o. 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  Patient admitted with hyperkalemia and SIRS.   12/28- PEG placed by GI   Reviewed I/O's: +417 ml x 24 hours and +617 ml since admission  UOP: 1.5 L x 24 hours  Patient familiar to this RD from recent prior admission. Noted he was discharged on 05/23/24 to [facility].   Patient NPO per MBSS on 05/16/24; SLP anticipates long term recovery for dysphagia. 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 Per GI notes, PEG must be in place for at least 6 weeks, but can be used up to year prior to exchange.   Patient sitting up in bed at time of visit. Patient opened his eyes and acknowledged RD presence when greeted. He is unable to provide history, responds yeah and okay to some questions asked. He denies any pain, nausea, vomiting or abdominal distention.   Osmolite 1.2 @ 50 ml/hr ordered per protocol.  Osmolite 1.2 currently infusing via PEG at 40 ml/hr. Noted discharge TF orders of: Osmolite 1.5 @ 60 ml/hr, 60 ml Prosource TF20 BID, 100 ml free water  flush every 4 hours. Regimen provides 2320 kcal (100% of needs), 130 grams of protein, and 1097 ml of H2O. Total free water : 1697 ml daily.   Case discussed with RN, MD, pharmacy. MD concerned about hyperkalemia and requesting RD revise TF regimen. Patient had a large BM on 05/29/24 evening. Patient on veltassa . RD will also increase free water . Communicated changes in orders to medical team.   Reviewed weight history; noted weight gain since last admission (82.6-88.8 kg), which is favorable given patient's malnutrition. Exam is also improved since last admission.   Medications reviewed and include vitamin B-12, farxiga , lovenox , melatonin, and veltassa .   Lab Results  Component Value Date   HGBA1C 8.1 (H) 05/11/2024   PTA DM medications are 0-6 units insulin  aspart TID, 10 mg farxiga  daily, and 500 mg metformin  BID.   Labs reviewed: K: 5.2, CBGS: 128-196 (inpatient orders for glycemic control are 0-15 units insulin  aspart every 4 hours).    NUTRITION - FOCUSED PHYSICAL EXAM:  Flowsheet Row Most Recent Value  Orbital Region Moderate depletion  Upper Arm Region Mild depletion  Thoracic and Lumbar Region Mild depletion  Buccal Region Moderate depletion  Temple Region Moderate depletion  Clavicle Bone Region Mild depletion  Clavicle and Acromion Bone Region Mild depletion  Scapular Bone Region Mild depletion  Dorsal Hand Moderate depletion  Patellar Region Severe depletion  Anterior Thigh Region Severe depletion  Posterior Calf Region Severe depletion  Edema (RD Assessment) None  Hair Reviewed  Eyes Reviewed  Mouth Reviewed  Skin Reviewed  Nails Reviewed    Diet Order:   Diet Order             Diet NPO time specified  Diet effective now                   EDUCATION NEEDS:   Not appropriate for education at this  time  Skin:  Skin Assessment: Reviewed RN Assessment  Last BM:  05/30/24 (type 6)  Height:   Ht Readings from Last 1 Encounters:  05/28/24 5' 11 (1.803 m)    Weight:   Wt Readings from Last 1 Encounters:  05/30/24 88.8 kg    Ideal Body Weight:  78.2 kg  BMI:  Body mass index is 27.3 kg/m.  Estimated Nutritional Needs:   Kcal:  2300-2500  Protein:  115-130 grams  Fluid:  2.0-2.2 L    Margery ORN, RD, LDN, CDCES Registered Dietitian III Certified Diabetes Care and Education Specialist If unable to reach this RD, please use RD Inpatient group chat on secure chat between hours of 8am-4 pm daily

## 2024-05-30 NOTE — Assessment & Plan Note (Signed)
 Hepatic steatosis on RUQ ultrasound 05/28/2024 Patient with newly abnormal LFTs,  Transaminitis started improving, acute hepatitis panel negative. -Holding statin - Continue to monitor

## 2024-05-30 NOTE — NC FL2 (Signed)
 " [place]  MEDICAID FL2 LEVEL OF CARE FORM     IDENTIFICATION  Patient Name: Juan Harper. Birthdate: 29-Sep-1960 Sex: male Admission Date (Current Location): 05/28/2024  [place] and [place] Number:  Chiropodist and Address:  [facility], [address], [place], [place] [address]      Provider Number: [id number]  Attending Physician Name and Address:  Caleen Qualia, MD  Relative Name and Phone Number:  Delon Hof Sister [phone]    Current Level of Care: Hospital Recommended Level of Care: Skilled Nursing Facility Prior Approval Number:    Date Approved/Denied:   PASRR Number: [id number]  Discharge Plan: SNF    Current Diagnoses: Patient Active Problem List   Diagnosis Date Noted   Malnutrition of moderate degree 05/30/2024   Dehydration 05/29/2024   Abnormal LFTs 05/28/2024   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) 05/28/2024   Gastrostomy status (HCC) 05/28/2024   SIRS (systemic inflammatory response syndrome) (HCC) 05/28/2024   Protein-calorie malnutrition, severe 05/14/2024   Cerebral infarction (HCC) 05/11/2024   Fall at home, initial encounter 02/07/2021   Syncope 02/07/2021   Right hip pain 02/07/2021   BPH (benign prostatic hyperplasia)    Chronic diastolic CHF (congestive heart failure) (HCC)    AKI (acute kidney injury) 02/06/2021   Acute kidney injury 01/06/2018   Hyperkalemia 01/06/2018   Acute renal failure (ARF) 02/26/2017   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   Sleep apnea 11/24/2016   Diabetes mellitus without complication (HCC) 11/24/2016   HTN (hypertension) 11/24/2016   Dizziness 11/24/2016   Acute on chronic renal failure 09/19/2016   Cervical disc disease with myelopathy 09/07/2016   Simple chronic bronchitis (HCC) 07/16/2016   Tobacco use 07/16/2016   Closed fracture of multiple ribs of left side 02/07/2016   BPH  with obstruction/lower urinary tract symptoms 05/23/2014   Elevated PSA 05/23/2014   Erectile dysfunction 05/23/2014   Chest pain 12/10/2010    Orientation RESPIRATION BLADDER Height & Weight     Self, Time, Situation, Place  Normal Incontinent Weight: 195 lb 12.3 oz (88.8 kg) Height:  5' 11 (180.3 cm)  BEHAVIORAL SYMPTOMS/MOOD NEUROLOGICAL BOWEL NUTRITION STATUS      Continent Feeding tube (Tube feeding regimen provides 2320 kcal (100% of needs), 130 grams of protein, and 1097 ml of H2O. Total free water : 2297 ml daily)  AMBULATORY STATUS COMMUNICATION OF NEEDS Skin   Extensive Assist Verbally                         Personal Care Assistance Level of Assistance  Bathing, Feeding, Dressing Bathing Assistance: Maximum assistance Feeding assistance: Maximum assistance Dressing Assistance: Maximum assistance     Functional Limitations Info  Sight, Hearing, Speech Sight Info: Adequate Hearing Info: Adequate Speech Info: Impaired    SPECIAL CARE FACTORS FREQUENCY        PT Frequency: 5x OT Frequency: 5x     Speech Therapy Frequency: 5x      Contractures      Additional Factors Info      Allergies Info: bee venom           Current Medications (05/30/2024):  This is the current hospital active medication list Current Facility-Administered Medications  Medication Dose Route Frequency Provider Last Rate Last Admin   acetaminophen  (TYLENOL ) tablet 650 mg  650 mg Per Tube Q6H PRN Cleatus Delayne GAILS, MD  Or   acetaminophen  (TYLENOL ) suppository 650 mg  650 mg Rectal Q6H PRN Cleatus Delayne GAILS, MD       albuterol  (PROVENTIL ) (2.5 MG/3ML) 0.083% nebulizer solution 2.5 mg  2.5 mg Nebulization Q2H PRN Duncan, Hazel V, MD       amLODipine  (NORVASC ) tablet 10 mg  10 mg Per Tube Daily Cleatus Delayne GAILS, MD   10 mg at 05/30/24 0857   aspirin  chewable tablet 81 mg  81 mg Per Tube Daily Cleatus Delayne GAILS, MD   81 mg at 05/30/24 0857   clopidogrel  (PLAVIX ) tablet 75 mg  75 mg Per  Tube Daily Cleatus Delayne GAILS, MD   75 mg at 05/30/24 9142   cyanocobalamin  (VITAMIN B12) tablet 1,000 mcg  1,000 mcg Per Tube Daily Amin, Sumayya, MD   1,000 mcg at 05/30/24 0857   dapagliflozin  propanediol (FARXIGA ) tablet 10 mg  10 mg Oral Daily Amin, Sumayya, MD   10 mg at 05/30/24 1102   enoxaparin  (LOVENOX ) injection 40 mg  40 mg Subcutaneous Q24H Duncan, Hazel V, MD   40 mg at 05/29/24 2042   feeding supplement (OSMOLITE 1.5 CAL) liquid 1,000 mL  1,000 mL Per Tube Continuous Amin, Sumayya, MD 60 mL/hr at 05/30/24 1410 1,000 mL at 05/30/24 1410   feeding supplement (PROSource TF20) liquid 60 mL  60 mL Per Tube BID Cleatus Delayne V, MD   60 mL at 05/30/24 1058   free water  200 mL  200 mL Per Tube Q4H Amin, Sumayya, MD   200 mL at 05/30/24 1422   HYDROcodone -acetaminophen  (NORCO/VICODIN) 5-325 MG per tablet 1-2 tablet  1-2 tablet Per Tube Q4H PRN Cleatus Delayne GAILS, MD   2 tablet at 05/30/24 0443   insulin  aspart (novoLOG ) injection 0-15 Units  0-15 Units Subcutaneous Q4H Cleatus Delayne GAILS, MD   2 Units at 05/30/24 1521   melatonin tablet 5 mg  5 mg Per Tube QHS Cleatus Delayne GAILS, MD   5 mg at 05/29/24 2043   multivitamin with minerals tablet 1 tablet  1 tablet Per Tube Daily Cleatus Delayne GAILS, MD   1 tablet at 05/30/24 0857   ondansetron  (ZOFRAN ) tablet 4 mg  4 mg Per Tube Q6H PRN Cleatus Delayne GAILS, MD       Or   ondansetron  (ZOFRAN ) injection 4 mg  4 mg Intravenous Q6H PRN Cleatus Delayne GAILS, MD       patiromer  (VELTASSA ) packet 16.8 g  16.8 g Oral Daily Amin, Sumayya, MD   16.8 g at 05/30/24 1103   polyethylene glycol (MIRALAX  / GLYCOLAX ) packet 17 g  17 g Per Tube Daily Caleen Qualia, MD   17 g at 05/30/24 1058   sennosides (SENOKOT) 8.8 MG/5ML syrup 10 mL  10 mL Per Tube QHS PRN Amin, Sumayya, MD       umeclidinium bromide  (INCRUSE ELLIPTA ) 62.5 MCG/ACT 1 puff  1 puff Inhalation Daily Amin, Sumayya, MD   1 puff at 05/30/24 1520     Discharge Medications: Please see discharge summary for a list of  discharge medications.  Relevant Imaging Results:  Relevant Lab Results:   Additional Information [id number]  Alfonso Rummer, LCSW     "

## 2024-05-30 NOTE — Care Management Obs Status (Signed)
 MEDICARE OBSERVATION STATUS NOTIFICATION   Patient Details  Name: Juan Harper. MRN: [id number] Date of Birth: 1960/11/27   Medicare Observation Status Notification Given:  Yes    [name] 05/30/2024, 1:10 PM

## 2024-05-30 NOTE — Assessment & Plan Note (Signed)
 CBG within goal Continue sliding scale insulin  coverage

## 2024-05-30 NOTE — Assessment & Plan Note (Addendum)
 Significantly elevated potassium on admission s/p medical management and Veltassa . Potassium at 5.2 today -Continue current feeding formula with low potassium - Continue Veltassa   -Further increase in free water  intake -Monitor potassium

## 2024-05-30 NOTE — Assessment & Plan Note (Addendum)
 Uncertain etiology, likely some dehydration  Creatinine now at baseline -Increasing free water  intake through G-tube

## 2024-05-30 NOTE — Assessment & Plan Note (Signed)
 BP controlled.  Continue meds via G-tube

## 2024-05-31 DIAGNOSIS — N179 Acute kidney failure, unspecified: Secondary | ICD-10-CM | POA: Diagnosis not present

## 2024-05-31 DIAGNOSIS — E440 Moderate protein-calorie malnutrition: Secondary | ICD-10-CM

## 2024-05-31 DIAGNOSIS — R6510 Systemic inflammatory response syndrome (SIRS) of non-infectious origin without acute organ dysfunction: Secondary | ICD-10-CM | POA: Diagnosis not present

## 2024-05-31 DIAGNOSIS — R7989 Other specified abnormal findings of blood chemistry: Secondary | ICD-10-CM | POA: Diagnosis not present

## 2024-05-31 DIAGNOSIS — E875 Hyperkalemia: Secondary | ICD-10-CM | POA: Diagnosis not present

## 2024-05-31 LAB — COMPREHENSIVE METABOLIC PANEL WITH GFR
ALT: 291 U/L — ABNORMAL HIGH (ref 0–44)
AST: 116 U/L — ABNORMAL HIGH (ref 15–41)
Albumin: 3.6 g/dL (ref 3.5–5.0)
Alkaline Phosphatase: 136 U/L — ABNORMAL HIGH (ref 38–126)
Anion gap: 13 (ref 5–15)
BUN: 32 mg/dL — ABNORMAL HIGH (ref 8–23)
CO2: 24 mmol/L (ref 22–32)
Calcium: 9.9 mg/dL (ref 8.9–10.3)
Chloride: 98 mmol/L (ref 98–111)
Creatinine, Ser: 1.01 mg/dL (ref 0.61–1.24)
GFR, Estimated: >60 mL/min
Glucose, Bld: 210 mg/dL — ABNORMAL HIGH (ref 70–99)
Potassium: 4.8 mmol/L (ref 3.5–5.1)
Sodium: 135 mmol/L (ref 135–145)
Total Bilirubin: 0.3 mg/dL (ref 0.0–1.2)
Total Protein: 8 g/dL (ref 6.5–8.1)

## 2024-05-31 LAB — GLUCOSE, CAPILLARY
Glucose-Capillary: 139 mg/dL — ABNORMAL HIGH (ref 70–99)
Glucose-Capillary: 139 mg/dL — ABNORMAL HIGH (ref 70–99)
Glucose-Capillary: 163 mg/dL — ABNORMAL HIGH (ref 70–99)
Glucose-Capillary: 169 mg/dL — ABNORMAL HIGH (ref 70–99)
Glucose-Capillary: 175 mg/dL — ABNORMAL HIGH (ref 70–99)
Glucose-Capillary: 188 mg/dL — ABNORMAL HIGH (ref 70–99)
Glucose-Capillary: 194 mg/dL — ABNORMAL HIGH (ref 70–99)
Glucose-Capillary: 207 mg/dL — ABNORMAL HIGH (ref 70–99)

## 2024-05-31 LAB — CULTURE, BLOOD (ROUTINE X 2)

## 2024-05-31 NOTE — Evaluation (Signed)
 Physical Therapy Evaluation Patient Details Name: Juan Harper. MRN: [id number] DOB: 02-03-1961 Today's Date: 05/31/2024  History of Present Illness  Pt is a 64 y.o. 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 MD assessment includes: Hyperkalemia, SIRS, AKI, and abnormal LFTs.   Clinical Impression  Pt with flat affect and difficulty communicating verbally but was able to follow 1-step commands with extra time and cuing and put forth good effort during the session.  Pt required only minimal assist with below functional tasks and was mostly steady in standing with no overt LOB.  Pt did require cuing for sequencing with gait along with min A to guide the RW at times and ambulated with a slow, effortful, and at times shuffling pattern putting him at increased risk for falls.  Pt will benefit from continued PT services upon discharge to safely address deficits listed in patient problem list for decreased caregiver assistance and eventual return to PLOF.         If plan is discharge home, recommend the following: A little help with walking and/or transfers;A little help with bathing/dressing/bathroom;Assistance with cooking/housework;Direct supervision/assist for medications management;Assist for transportation;Help with stairs or ramp for entrance   Can travel by private vehicle   Yes    Equipment Recommendations Other (comment) (TBD at next venue of care)  Recommendations for Other Services       Functional Status Assessment Patient has had a recent decline in their functional status and demonstrates the ability to make significant improvements in function in a reasonable and predictable amount of time.     Precautions / Restrictions Precautions Precautions: Fall Precaution/Restrictions Comments: PEG Restrictions Weight  Bearing Restrictions Per Provider Order: No      Mobility  Bed Mobility Overal bed mobility: Needs Assistance Bed Mobility: Supine to Sit, Rolling Rolling: Min assist   Supine to sit: Min assist, Used rails     General bed mobility comments: Min cuing for sequencing with use of rails and min A for BLE and trunk control    Transfers Overall transfer level: Needs assistance Equipment used: Rolling walker (2 wheels) Transfers: Sit to/from Stand Sit to Stand: Min assist, From elevated surface           General transfer comment: Min verbal cues for hand placement with pt able to come to standing with only min A and once in standing presented with good static standing balance with the RW    Ambulation/Gait Ambulation/Gait assistance: Min assist Gait Distance (Feet): 20 Feet Assistive device: Rolling walker (2 wheels) Gait Pattern/deviations: Shuffle, Step-through pattern, Decreased step length - right, Decreased step length - left Gait velocity: decreased     General Gait Details: Pt able to amb 20 feet including forward and backward steps with occasional min A to guide the RW but no overt LOB although very slow, shuffling cadence; min to mod verbal and tactile cues for amb closer to the RW with upright posture  Stairs            Wheelchair Mobility     Tilt Bed    Modified Rankin (Stroke Patients Only)       Balance Overall balance assessment: Needs assistance Sitting-balance support: Feet supported, Single extremity supported Sitting balance-Leahy Scale: Good     Standing balance support: Bilateral upper extremity supported, Reliant on assistive device for balance, During functional activity  Standing balance-Leahy Scale: Fair                               Pertinent Vitals/Pain Pain Assessment Pain Assessment: No/denies pain    Home Living Family/patient expects to be discharged to:: Private residence Living Arrangements:  Alone Available Help at Discharge: Family Type of Home: House Home Access: Stairs to enter Entrance Stairs-Rails: None Secretary/administrator of Steps: 4   Home Layout: One level Home Equipment: Agricultural Consultant (2 wheels) Additional Comments: History and PLOF from chart review, prior admission due to pt difficult to understand    Prior Function Prior Level of Function : Independent/Modified Independent             Mobility Comments: Mod Ind amb with a RW       Extremity/Trunk Assessment   Upper Extremity Assessment Upper Extremity Assessment: Generalized weakness    Lower Extremity Assessment Lower Extremity Assessment: Generalized weakness       Communication   Communication Communication: Impaired Factors Affecting Communication: Difficulty expressing self;Reduced clarity of speech    Cognition Arousal: Alert Behavior During Therapy: Flat affect   PT - Cognitive impairments: No family/caregiver present to determine baseline, Difficult to assess Difficult to assess due to: Impaired communication                       Following commands: Impaired Following commands impaired: Follows one step commands with increased time     Cueing Cueing Techniques: Verbal cues, Tactile cues     General Comments      Exercises     Assessment/Plan    PT Assessment Patient needs continued PT services  PT Problem List Decreased strength;Decreased activity tolerance;Decreased balance;Decreased mobility;Decreased knowledge of use of DME       PT Treatment Interventions DME instruction;Gait training;Functional mobility training;Stair training;Therapeutic activities;Therapeutic exercise;Balance training    PT Goals (Current goals can be found in the Care Plan section)  Acute Rehab PT Goals PT Goal Formulation: Patient unable to participate in goal setting Time For Goal Achievement: 06/13/24 Potential to Achieve Goals: Good    Frequency Min 2X/week      Co-evaluation               AM-PAC PT 6 Clicks Mobility  Outcome Measure Help needed turning from your back to your side while in a flat bed without using bedrails?: A Little Help needed moving from lying on your back to sitting on the side of a flat bed without using bedrails?: A Little Help needed moving to and from a bed to a chair (including a wheelchair)?: A Little Help needed standing up from a chair using your arms (e.g., wheelchair or bedside chair)?: A Little Help needed to walk in hospital room?: A Little Help needed climbing 3-5 steps with a railing? : A Lot 6 Click Score: 17    End of Session Equipment Utilized During Treatment: Gait belt Activity Tolerance: Patient tolerated treatment well Patient left: in chair;with call bell/phone within reach;with chair alarm set Nurse Communication: Mobility status (Feeding via PEG paused during session with ok from nursing, restarted at end of session) PT Visit Diagnosis: Unsteadiness on feet (R26.81);Muscle weakness (generalized) (M62.81);Difficulty in walking, not elsewhere classified (R26.2)    Time: 9051-8986 PT Time Calculation (min) (ACUTE ONLY): 25 min   Charges:   PT Evaluation $PT Eval Moderate Complexity: 1 Mod PT Treatments $Gait Training: 8-22 mins PT General  Charges $$ ACUTE PT VISIT: 1 Visit       D. Scott Natale Barba PT, DPT 05/31/2024, 10:49 AM

## 2024-05-31 NOTE — TOC Progression Note (Signed)
 Transition of Care (TOC) - Progression Note    Patient Details  Name: Juan Harper. MRN: [id number] Date of Birth: [birth year]  [facility]) CM/SW Contact  Shasta DELENA Daring, RN Phone Number: 05/31/2024, 9:40 AM  Clinical Narrative:     Contacted [facility] to verify if patient can return to facility. AD said yes and that patient can return to bed E4, anytime today. Patient insurance required updated PT and OT assessments and new insurance authorization. Notified MD to place order.                    Expected Discharge Plan and Services                                               Social Drivers of Health (SDOH) Interventions SDOH Screenings   Food Insecurity: Patient Unable To Answer (05/29/2024)  Housing: Patient Unable To Answer (05/29/2024)  Transportation Needs: Patient Unable To Answer (05/29/2024)  Utilities: Patient Unable To Answer (05/29/2024)  Financial Resource Strain: High Risk (06/18/2023)   Received from [facility]  Social Connections: Patient Unable To Answer (05/29/2024)  Tobacco Use: High Risk (05/28/2024)    Readmission Risk Interventions     No data to display

## 2024-05-31 NOTE — Evaluation (Signed)
 Occupational Therapy Evaluation Patient Details Name: Juan Harper. MRN: [id number] DOB: [birth year] Today's Date: 05/31/2024   History of Present Illness   Pt is a 64 y.o. 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 MD assessment includes: Hyperkalemia, SIRS, AKI, and abnormal LFTs.     Clinical Impressions Chart reviewed to date, pt greeted in chair, endorsing he wants to return to bed. Pt is dysarthric/garbled speech. He does respond to yes/no questions. After session, provided patient with communication board to supplement his attempts at directing his care. PTA pt is at rehab following CVA, anticipate assist for ADL/IADL/mobility, prior to that was amb with RW, MOD I-I in ADL. Pt presents with deficit sin strength, endurance, activity tolerance, balance, affecting safe and optimal ADL completion. MIN A required for STS with R lateral lean noted with static standing, MIN A +2 for step pivot transfer back to bed. MIN A for grooming tasks in bed. Pt is performing ADL/functional mobility below PLOF, will benefit from acute OT to address functional deficits/facilitate optimal ADL performance. Pt is left semi supine in bed, all needs met. OT will follow.      If plan is discharge home, recommend the following:   A lot of help with bathing/dressing/bathroom;Assist for transportation;Assistance with cooking/housework;Help with stairs or ramp for entrance;Direct supervision/assist for medications management;A little help with walking and/or transfers;A little help with bathing/dressing/bathroom     Functional Status Assessment   Patient has had a recent decline in their functional status and demonstrates the ability to make significant improvements in function in a reasonable and predictable amount of time.     Equipment  Recommendations   Other (comment) (defer to next venue of care)     Recommendations for Other Services         Precautions/Restrictions   Precautions Precautions: Fall Recall of Precautions/Restrictions: Impaired Precaution/Restrictions Comments: PEG Restrictions Weight Bearing Restrictions Per Provider Order: No     Mobility Bed Mobility Overal bed mobility: Needs Assistance Bed Mobility: Sit to Supine       Sit to supine: Min assist        Transfers Overall transfer level: Needs assistance Equipment used: Rolling walker (2 wheels) Transfers: Sit to/from Stand Sit to Stand: Min assist, +2 safety/equipment (from bedside chair)           General transfer comment: frequent vcs for technique/body mechanics      Balance Overall balance assessment: Needs assistance Sitting-balance support: Feet supported Sitting balance-Leahy Scale: Good   Postural control: Right lateral lean Standing balance support: Bilateral upper extremity supported, Reliant on assistive device for balance, During functional activity Standing balance-Leahy Scale: Fair Standing balance comment: R lateral lean in standing                           ADL either performed or assessed with clinical judgement   ADL Overall ADL's : Needs assistance/impaired     Grooming: Bed level;Wash/dry face;Oral care;Minimal assistance               Lower Body Dressing: Maximal assistance   Toilet Transfer: Minimal assistance;Cueing for sequencing;Cueing for safety;Rolling walker (2 wheels) Toilet Transfer Details (indicate cue type and reason): simulated, step piovot from bed side chair to bed, R lateral lean with facilitation to remain in neutral  Vision Patient Visual Report: No change from baseline Additional Comments: will continue to assess     Perception         Praxis         Pertinent Vitals/Pain Pain Assessment Pain Assessment: Faces Faces  Pain Scale: No hurt     Extremity/Trunk Assessment Upper Extremity Assessment Upper Extremity Assessment: Generalized weakness RUE Deficits / Details: grossly 3+/5 throughout LUE Deficits / Details: AROM: shoulder flexion approx 1/2 full AROM, able to use LUE functionally for RW use   Lower Extremity Assessment Lower Extremity Assessment: Generalized weakness       Communication Communication Communication: Impaired Factors Affecting Communication: Difficulty expressing self;Reduced clarity of speech   Cognition Arousal: Alert Behavior During Therapy: Flat affect Cognition: Difficult to assess Difficult to assess due to: Impaired communication                             Following commands: Impaired Following commands impaired: Follows one step commands with increased time     Cueing  General Comments   Cueing Techniques: Verbal cues;Tactile cues      Exercises Other Exercises Other Exercises: edu pt re: role of OT, role of rehab, discharge recommendations; edu pt/sister after OT returned to room on use of communication board   Shoulder Instructions      Home Living Family/patient expects to be discharged to:: Private residence Living Arrangements: Alone Available Help at Discharge: Family Type of Home: House Home Access: Stairs to enter Secretary/administrator of Steps: 4 Entrance Stairs-Rails: None Home Layout: One level     Bathroom Shower/Tub: Tub/shower unit         Home Equipment: Agricultural Consultant (2 wheels)   Additional Comments: history and PLOF from chart review, pt with impaired speech  Lives With: Other (Comment) (roommate)    Prior Functioning/Environment Prior Level of Function : Independent/Modified Independent             Mobility Comments: MOD I amb with RW ADLs Comments: anticipate assist at SNF, indep prior to stroke    OT Problem List: Decreased strength;Decreased activity tolerance;Impaired balance (sitting and/or  standing);Decreased safety awareness;Decreased cognition   OT Treatment/Interventions: Self-care/ADL training;Therapeutic exercise;Therapeutic activities;Patient/family education;DME and/or AE instruction;Balance training      OT Goals(Current goals can be found in the care plan section)   Acute Rehab OT Goals Patient Stated Goal: rehab OT Goal Formulation: With patient Time For Goal Achievement: 06/14/24 Potential to Achieve Goals: Good ADL Goals Pt Will Perform Grooming: with supervision;sitting;standing Pt Will Perform Lower Body Dressing: with supervision;sitting/lateral leans;sit to/from stand Pt Will Transfer to Toilet: with supervision;ambulating Pt Will Perform Toileting - Clothing Manipulation and hygiene: with supervision;sitting/lateral leans;sit to/from stand   OT Frequency:  Min 3X/week    Co-evaluation              AM-PAC OT 6 Clicks Daily Activity     Outcome Measure Help from another person eating meals?: A Little Help from another person taking care of personal grooming?: A Little Help from another person toileting, which includes using toliet, bedpan, or urinal?: A Lot Help from another person bathing (including washing, rinsing, drying)?: A Lot Help from another person to put on and taking off regular upper body clothing?: A Little Help from another person to put on and taking off regular lower body clothing?: A Lot 6 Click Score: 15   End of Session Equipment Utilized During Treatment: Rolling walker (2 wheels)  Nurse Communication: Mobility status  Activity Tolerance: Patient tolerated treatment well Patient left: with call bell/phone within reach;with bed alarm set  OT Visit Diagnosis: Other abnormalities of gait and mobility (R26.89);Muscle weakness (generalized) (M62.81);Unsteadiness on feet (R26.81)                Time: 8955-8892 OT Time Calculation (min): 23 min Charges:  OT General Charges $OT Visit: 1 Visit OT Evaluation $OT Eval  Moderate Complexity: 1 Mod  Therisa Sheffield, OTD OTR/L  05/31/2024, 12:01 PM

## 2024-05-31 NOTE — Progress Notes (Signed)
 Nutrition Follow-up  DOCUMENTATION CODES:   Non-severe (moderate) malnutrition in context of social or environmental circumstances  INTERVENTION:   -TF via PEG:   Osmolite 1.5 @ 60 ml/hr   60 ml Prosource TF20 BID     200 ml free water  flush every 4 hours   Tube feeding regimen provides 2320 kcal (100% of needs), 130 grams of protein, and 1097 ml of H2O. Total free water : 2297 ml daily  NUTRITION DIAGNOSIS:   Moderate Malnutrition related to social / environmental circumstances as evidenced by mild fat depletion, moderate fat depletion, mild muscle depletion, moderate muscle depletion.  Ongoing  GOAL:   Patient will meet greater than or equal to 90% of their needs  Met with TF  MONITOR:   TF tolerance, Diet advancement  REASON FOR ASSESSMENT:   Consult Assessment of nutrition requirement/status  ASSESSMENT:   64 y.o. 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  12/28- PEG placed by GI   Reviewed I/O's: -3.2 L x 24 hours and -2.6 L since admission  UOP: 5.3 L x 24 hours   Patient sleeping soundly at time of visit. He did not respond to name being called no family at bedside.   Patient is NPO and receives sole source nutrition via PEG. Osmolite 1.5 infusing at goal rate of 60 ml/hr. Noted K levels are now WLD (4.8).   Medications reviewed and include vitamin B-12, lovenox , veltassa , and miralax .   Per TOC notes, plan for alternative SNF placement at discharge. Per TOC, prior SNF ([facility]) is unable to take patient back as they are unable to meet his needs.   Labs reviewed: CBGS: 120-194 (inpatient orders for glycemic control are 0-15 units insulin  aspart every 4 hours).    Diet Order:   Diet Order             Diet NPO time specified  Diet effective now                   EDUCATION  NEEDS:   Not appropriate for education at this time  Skin:  Skin Assessment: Reviewed RN Assessment  Last BM:  05/30/24 (type 5)  Height:   Ht Readings from Last 1 Encounters:  05/28/24 5' 11 (1.803 m)    Weight:   Wt Readings from Last 1 Encounters:  05/31/24 82.1 kg    Ideal Body Weight:  78.2 kg  BMI:  Body mass index is 25.24 kg/m.  Estimated Nutritional Needs:   Kcal:  2300-2500  Protein:  115-130 grams  Fluid:  2.0-2.2 L    Margery ORN, RD, LDN, CDCES Registered Dietitian III Certified Diabetes Care and Education Specialist If unable to reach this RD, please use RD Inpatient group chat on secure chat between hours of 8am-4 pm daily

## 2024-05-31 NOTE — Progress Notes (Signed)
 " Progress Note   Patient: Juan Harper. FMW:[id number] DOB: 1961-01-23 DOA: 05/28/2024     0 DOS: the patient was seen and examined on 05/31/2024   Brief hospital course: Partly taken from H&P.  Juan Harper. is a 64 y.o. 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  He was otherwise at baseline.   On presentation vitals with tachycardia at 123, BP 91/67.  Labs with potassium of 6.5, creatinine 1.39,  elevated LFTs, AST/ALT 236/362 with alk phos 130 and normal bili.CBC with normal WBC of 9.2.  Hemoglobin 11.7 down from 13.5 a couple weeks prior,Respiratory panel negative for COVID flu and RSV and urinalysis unremarkable. EKG showed questionable a flutter at 103, possible second-degree heart block-changed from last EKG on 12/18. Chest x-ray negative for any acute abnormality. RUQ ultrasound suggesting hepatic steatosis. CBD 3 mm.  Patient received insulin  and dextrose , normal saline and Veltassa  via G-tube.  1/4: Hemodynamically stable, potassium improved to 5.8>>5.1, creatinine improved to 1.21 with baseline around 1.    1/5: Remained hemodynamically stable with worsening hyperkalemia again, potassium at 5.2, hyponatremia improved.  Adjusting tube feed for low potassium formula, adding bowel regimen and increasing the dose of Veltassa  to 16 g. Hepatic function started improving-holding statin.  1/6: Remained hemodynamically stable, potassium normalized.  PT and OT are recommending SNF, apparently required another insurance authorization in order to go back to his SNF.  Assessment and Plan: * Hyperkalemia Significantly elevated potassium on admission s/p medical management and Veltassa . Potassium normal today -Continue current feeding formula with low potassium - Continue Veltassa  for another day -Monitor potassium  SIRS  (systemic inflammatory response syndrome) (HCC) Likely secondary to dehydration and hyperkalemia, mild tachycardia, afebrile. No infectious etiology identified, UA, RVP and CXR all unremarkable and WBC normal Suspect dehydration Holding off on antibiotics Will evaluate for improvement with IV hydration only  AKI (acute kidney injury) Uncertain etiology, likely some dehydration as creatinine started improving with IV fluid. 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 -Holding statin - Continue to monitor  Gastrostomy status (HCC) - Continue with tube feed  HTN (hypertension) BP controlled.  Continue meds via G-tube  Diabetes mellitus without complication (HCC) CBG within goal Continue sliding scale insulin  coverage  Chronic diastolic CHF (congestive heart failure) (HCC) Continue GDMT via G-tube   Subjective: Patient was seen and examined today.  No new concern.  Sister at bedside.  Physical Exam: Vitals:   05/31/24 0415 05/31/24 0748 05/31/24 1122 05/31/24 1648  BP: 123/72 125/88 134/83 120/60  Pulse: 90 (!) 110 (!) 110 92  Resp: 16 16 17 18   Temp: (!) 97.4 F (36.3 C) 98 F (36.7 C) 98.2 F (36.8 C) 97.9 F (36.6 C)  TempSrc: Oral Oral Oral   SpO2: 95% 98% 97% 93%  Weight: 82.1 kg     Height:       General.  Frail gentleman, in no acute distress. Pulmonary.  Lungs clear bilaterally, normal respiratory effort. CV.  Regular rate and rhythm, no JVD, rub or murmur. Abdomen.  Soft, nontender, nondistended, BS positive. CNS.  Alert and oriented .  No new focal neurologic deficit. Extremities.  No edema, no cyanosis, pulses intact and  symmetrical.   Data Reviewed: Prior data reviewed  Family Communication: Discussed with sister at bedside  Disposition: Status is: Observation The patient will require care spanning > 2 midnights and should be moved to inpatient because: Severity of illness  Planned Discharge Destination: Skilled nursing facility  DVT prophylaxis.  Lovenox  Time spent: 45 minutes  This record has been created using Conservation officer, historic buildings. Errors have been sought and corrected,but may not always be located. Such creation errors do not reflect on the standard of care.   Author: Amaryllis Dare, MD 05/31/2024 5:26 PM  For on call review [email or web address].  "

## 2024-06-01 DIAGNOSIS — E119 Type 2 diabetes mellitus without complications: Secondary | ICD-10-CM | POA: Diagnosis not present

## 2024-06-01 DIAGNOSIS — I5032 Chronic diastolic (congestive) heart failure: Secondary | ICD-10-CM | POA: Diagnosis not present

## 2024-06-01 DIAGNOSIS — E860 Dehydration: Secondary | ICD-10-CM | POA: Diagnosis not present

## 2024-06-01 DIAGNOSIS — N400 Enlarged prostate without lower urinary tract symptoms: Secondary | ICD-10-CM | POA: Diagnosis not present

## 2024-06-01 DIAGNOSIS — R6510 Systemic inflammatory response syndrome (SIRS) of non-infectious origin without acute organ dysfunction: Secondary | ICD-10-CM | POA: Diagnosis not present

## 2024-06-01 DIAGNOSIS — I10 Essential (primary) hypertension: Secondary | ICD-10-CM | POA: Diagnosis not present

## 2024-06-01 DIAGNOSIS — Z8673 Personal history of transient ischemic attack (TIA), and cerebral infarction without residual deficits: Secondary | ICD-10-CM | POA: Diagnosis not present

## 2024-06-01 DIAGNOSIS — R7989 Other specified abnormal findings of blood chemistry: Secondary | ICD-10-CM | POA: Diagnosis not present

## 2024-06-01 DIAGNOSIS — Z931 Gastrostomy status: Secondary | ICD-10-CM | POA: Diagnosis not present

## 2024-06-01 DIAGNOSIS — N179 Acute kidney failure, unspecified: Secondary | ICD-10-CM | POA: Diagnosis not present

## 2024-06-01 DIAGNOSIS — E440 Moderate protein-calorie malnutrition: Secondary | ICD-10-CM | POA: Diagnosis not present

## 2024-06-01 DIAGNOSIS — E875 Hyperkalemia: Secondary | ICD-10-CM | POA: Diagnosis not present

## 2024-06-01 LAB — GLUCOSE, CAPILLARY
Glucose-Capillary: 112 mg/dL — ABNORMAL HIGH (ref 70–99)
Glucose-Capillary: 139 mg/dL — ABNORMAL HIGH (ref 70–99)
Glucose-Capillary: 183 mg/dL — ABNORMAL HIGH (ref 70–99)
Glucose-Capillary: 207 mg/dL — ABNORMAL HIGH (ref 70–99)
Glucose-Capillary: 212 mg/dL — ABNORMAL HIGH (ref 70–99)
Glucose-Capillary: 99 mg/dL (ref 70–99)

## 2024-06-01 LAB — CBC
HCT: 33.8 % — ABNORMAL LOW (ref 39.0–52.0)
Hemoglobin: 11.5 g/dL — ABNORMAL LOW (ref 13.0–17.0)
MCH: 29 pg (ref 26.0–34.0)
MCHC: 34 g/dL (ref 30.0–36.0)
MCV: 85.1 fL (ref 80.0–100.0)
Platelets: 340 K/uL (ref 150–400)
RBC: 3.97 MIL/uL — ABNORMAL LOW (ref 4.22–5.81)
RDW: 12.6 % (ref 11.5–15.5)
WBC: 6.1 K/uL (ref 4.0–10.5)
nRBC: 0 % (ref 0.0–0.2)

## 2024-06-01 LAB — COMPREHENSIVE METABOLIC PANEL WITH GFR
ALT: 200 U/L — ABNORMAL HIGH (ref 0–44)
AST: 83 U/L — ABNORMAL HIGH (ref 15–41)
Albumin: 3.5 g/dL (ref 3.5–5.0)
Alkaline Phosphatase: 127 U/L — ABNORMAL HIGH (ref 38–126)
Anion gap: 12 (ref 5–15)
BUN: 35 mg/dL — ABNORMAL HIGH (ref 8–23)
CO2: 25 mmol/L (ref 22–32)
Calcium: 10 mg/dL (ref 8.9–10.3)
Chloride: 99 mmol/L (ref 98–111)
Creatinine, Ser: 1.03 mg/dL (ref 0.61–1.24)
GFR, Estimated: >60 mL/min
Glucose, Bld: 120 mg/dL — ABNORMAL HIGH (ref 70–99)
Potassium: 4.9 mmol/L (ref 3.5–5.1)
Sodium: 136 mmol/L (ref 135–145)
Total Bilirubin: 0.3 mg/dL (ref 0.0–1.2)
Total Protein: 7.8 g/dL (ref 6.5–8.1)

## 2024-06-01 MED ORDER — QUETIAPINE FUMARATE 25 MG PO TABS: 25.0000 mg | ORAL_TABLET | Freq: Every day | ORAL | Status: DC

## 2024-06-01 MED ORDER — QUETIAPINE FUMARATE 25 MG PO TABS
25.0000 mg | ORAL_TABLET | Freq: Once | ORAL | Status: AC
  Administered 2024-06-01: 25 mg
  Filled 2024-06-01: qty 1

## 2024-06-01 NOTE — Plan of Care (Signed)

## 2024-06-01 NOTE — TOC Progression Note (Signed)
 Transition of Care (TOC) - Progression Note    Patient Details  Name: Juan Harper. MRN: [id number] Date of Birth: 1960-06-27  Transition of Care [facility]) CM/SW Contact  Shasta DELENA Daring, RN Phone Number: 06/01/2024, 2:57 PM  Clinical Narrative:    Safety sitter discontinued. Must remain sitter/restraint free for 24 hours prior to discharge.  Auth for [facility] stay received: Approved PlanAuthID:[id number] Dates:1/7-06/03/2024 Next Review Date:06/03/2024                     Expected Discharge Plan and Services                                               Social Drivers of Health (SDOH) Interventions SDOH Screenings   Food Insecurity: Patient Unable To Answer (05/29/2024)  Housing: Patient Unable To Answer (05/29/2024)  Transportation Needs: Patient Unable To Answer (05/29/2024)  Utilities: Patient Unable To Answer (05/29/2024)  Financial Resource Strain: High Risk (06/18/2023)   Received from [facility]  Social Connections: Patient Unable To Answer (05/29/2024)  Tobacco Use: High Risk (05/28/2024)    Readmission Risk Interventions     No data to display

## 2024-06-01 NOTE — Progress Notes (Signed)
 Attempting to unsafely exit the bed multiple times whilst pulling at tubes and lines. Unable to follow safety instructions. Right and Left soft wrist restraints applied for pt safety. Provider N. Donati-Garmon alerted to safety wrist restraint application.

## 2024-06-01 NOTE — Progress Notes (Signed)
 Patient continues to attempt to leave the bed unsafely. Additionally, patient disconnects feeding tube / pulls at wires. Safety sitter in place. Patient is not in distress.

## 2024-06-01 NOTE — Progress Notes (Signed)
 For the entire shift thus far, Pt has been attempting to unsafely exit the bed and setting off the bed alarm along with pulling at tubes and lines. He was able to disconnect his feeding tube line from the connection on his PEG tube. Mitts were attempted twice, however the pt is able to remove the mitts easily within less than a minute. Both times the mitts were properly placed in accordance with policy. The pt was informed that if he is not able to follow directions and does not stop pulling at his feeding tube and other equipment, there will be no choice but to apply wrist restraints for his safety. Currently monitoring pt for further unsafe behavior.

## 2024-06-01 NOTE — Progress Notes (Signed)
 " Progress Note   Patient: Juan Harper. FMW:[id number] DOB: [birth year] DOA: 05/28/2024     0 DOS: the patient was seen and examined on 06/01/2024   Brief hospital course: Partly taken from H&P.  Juan Harper. is a 64 y.o. 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  He was otherwise at baseline.   On presentation vitals with tachycardia at 123, BP 91/67.  Labs with potassium of 6.5, creatinine 1.39,  elevated LFTs, AST/ALT 236/362 with alk phos 130 and normal bili.CBC with normal WBC of 9.2.  Hemoglobin 11.7 down from 13.5 a couple weeks prior,Respiratory panel negative for COVID flu and RSV and urinalysis unremarkable. EKG showed questionable a flutter at 103, possible second-degree heart block-changed from last EKG on 12/18. Chest x-ray negative for any acute abnormality. RUQ ultrasound suggesting hepatic steatosis. CBD 3 mm.  Patient received insulin  and dextrose , normal saline and Veltassa  via G-tube.  1/4: Hemodynamically stable, potassium improved to 5.8>>5.1, creatinine improved to 1.21 with baseline around 1.    1/5: Remained hemodynamically stable with worsening hyperkalemia again, potassium at 5.2, hyponatremia improved.  Adjusting tube feed for low potassium formula, adding bowel regimen and increasing the dose of Veltassa  to 16 g. Hepatic function started improving-holding statin.  1/6: Remained hemodynamically stable, potassium normalized.  PT and OT are recommending SNF, apparently required another insurance authorization in order to go back to his SNF.  1/7: Remained hemodynamically stable with improving transaminitis.  Obtained insurance authorization to go back to SNF.  Apparently a sitter was placed due to some agitation overnight so SNF will not take him back today.  Sitter was discontinued.  Assessment  and Plan: * Hyperkalemia Significantly elevated potassium on admission s/p medical management and Veltassa . Potassium normal today -Continue current feeding formula with low potassium - Continue Veltassa  for another day -Monitor potassium  SIRS (systemic inflammatory response syndrome) (HCC) Likely secondary to dehydration and hyperkalemia, mild tachycardia, afebrile. No infectious etiology identified, UA, RVP and CXR all unremarkable and WBC normal Suspect dehydration Holding off on antibiotics Will evaluate for improvement with IV hydration only  AKI (acute kidney injury) Uncertain etiology, likely some dehydration as creatinine started improving with IV fluid. 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 -Holding statin - Continue to monitor  Gastrostomy status (HCC) - Continue with tube feed  HTN (hypertension) BP controlled.  Continue meds via G-tube  Diabetes mellitus without complication (HCC) CBG within goal Continue sliding scale insulin  coverage  Chronic diastolic CHF (congestive heart failure) (HCC) Continue GDMT via G-tube   Subjective: Patient was sitting in chair when seen today.  Appears more interactive.  No new concern.  Physical Exam: Vitals:   06/01/24 0540 06/01/24 0733 06/01/24 1234 06/01/24 1516  BP:  122/68 105/61 118/82  Pulse:  86 85 86  Resp:  18 17 16   Temp:  97.6 F (36.4 C) 98 F (36.7 C) 97.7 F (36.5 C)  TempSrc:  Oral    SpO2:  100% 98% 97%  Weight: 81.3 kg     Height:       General.  Frail gentleman,  in no acute distress. Pulmonary.  Lungs clear bilaterally, normal respiratory effort. CV.  Regular  rate and rhythm, no JVD, rub or murmur. Abdomen.  Soft, nontender, nondistended, BS positive. CNS.  Alert and oriented .  No new focal neurologic deficit. Extremities.  No edema, no cyanosis, pulses intact and symmetrical.  Data Reviewed: Prior data reviewed  Family Communication:   Disposition: Status is: Observation The patient will require care spanning > 2 midnights and should be moved to inpatient because: Severity of illness  Planned Discharge Destination: Skilled nursing facility  DVT prophylaxis.  Lovenox  Time spent: 44 minutes  This record has been created using Conservation officer, historic buildings. Errors have been sought and corrected,but may not always be located. Such creation errors do not reflect on the standard of care.   Author: Amaryllis Dare, MD 06/01/2024 6:04 PM  For on call review [email or web address].  "

## 2024-06-02 DIAGNOSIS — E875 Hyperkalemia: Secondary | ICD-10-CM | POA: Diagnosis not present

## 2024-06-02 DIAGNOSIS — R6510 Systemic inflammatory response syndrome (SIRS) of non-infectious origin without acute organ dysfunction: Secondary | ICD-10-CM | POA: Diagnosis not present

## 2024-06-02 DIAGNOSIS — R7989 Other specified abnormal findings of blood chemistry: Secondary | ICD-10-CM | POA: Diagnosis not present

## 2024-06-02 DIAGNOSIS — N179 Acute kidney failure, unspecified: Secondary | ICD-10-CM | POA: Diagnosis not present

## 2024-06-02 LAB — BASIC METABOLIC PANEL WITH GFR
Anion gap: 10 (ref 5–15)
BUN: 36 mg/dL — ABNORMAL HIGH (ref 8–23)
CO2: 27 mmol/L (ref 22–32)
Calcium: 9.9 mg/dL (ref 8.9–10.3)
Chloride: 98 mmol/L (ref 98–111)
Creatinine, Ser: 1.02 mg/dL (ref 0.61–1.24)
GFR, Estimated: >60 mL/min
Glucose, Bld: 107 mg/dL — ABNORMAL HIGH (ref 70–99)
Potassium: 5.2 mmol/L — ABNORMAL HIGH (ref 3.5–5.1)
Sodium: 134 mmol/L — ABNORMAL LOW (ref 135–145)

## 2024-06-02 LAB — GLUCOSE, CAPILLARY
Glucose-Capillary: 225 mg/dL — ABNORMAL HIGH (ref 70–99)
Glucose-Capillary: 90 mg/dL (ref 70–99)
Glucose-Capillary: 154 mg/dL — ABNORMAL HIGH (ref 70–99)
Glucose-Capillary: 121 mg/dL — ABNORMAL HIGH (ref 70–99)
Glucose-Capillary: 96 mg/dL (ref 70–99)

## 2024-06-02 MED ORDER — QUETIAPINE FUMARATE 25 MG PO TABS
25.0000 mg | ORAL_TABLET | Freq: Every day | ORAL | Status: DC
  Administered 2024-06-02: 25 mg
  Filled 2024-06-02: qty 1

## 2024-06-02 MED ORDER — FREE WATER
250.0000 mL | Status: DC
  Administered 2024-06-02 – 2024-06-03 (×6): 250 mL

## 2024-06-02 NOTE — Progress Notes (Signed)
 " Progress Note   Patient: Juan Harper. FMW:[id number] DOB: [birth year] DOA: 05/28/2024     0 DOS: the patient was seen and examined on 06/02/2024   Brief hospital course: Partly taken from H&P.  Faron Tudisco. is a 64 y.o. 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  He was otherwise at baseline.   On presentation vitals with tachycardia at 123, BP 91/67.  Labs with potassium of 6.5, creatinine 1.39,  elevated LFTs, AST/ALT 236/362 with alk phos 130 and normal bili.CBC with normal WBC of 9.2.  Hemoglobin 11.7 down from 13.5 a couple weeks prior,Respiratory panel negative for COVID flu and RSV and urinalysis unremarkable. EKG showed questionable a flutter at 103, possible second-degree heart block-changed from last EKG on 12/18. Chest x-ray negative for any acute abnormality. RUQ ultrasound suggesting hepatic steatosis. CBD 3 mm.  Patient received insulin  and dextrose , normal saline and Veltassa  via G-tube.  1/4: Hemodynamically stable, potassium improved to 5.8>>5.1, creatinine improved to 1.21 with baseline around 1.    1/5: Remained hemodynamically stable with worsening hyperkalemia again, potassium at 5.2, hyponatremia improved.  Adjusting tube feed for low potassium formula, adding bowel regimen and increasing the dose of Veltassa  to 16 g. Hepatic function started improving-holding statin.  1/6: Remained hemodynamically stable, potassium normalized.  PT and OT are recommending SNF, apparently required another insurance authorization in order to go back to his SNF.  1/7: Remained hemodynamically stable with improving transaminitis.  Obtained insurance authorization to go back to SNF.  Apparently a sitter was placed due to some agitation overnight so SNF will not take him back today.  Sitter was discontinued.  1/8:  Hemodynamically stable, potassium at 5.2, increasing free water .  Patient apparently required some restraint overnight due to agitation, will not be able to go back until tomorrow. Seroquel  was added at night.  Should avoid any sitter or restraints.  Assessment and Plan: * Hyperkalemia Significantly elevated potassium on admission s/p medical management and Veltassa . Potassium at 5.2 today -Continue current feeding formula with low potassium - Continue Veltassa   -Further increase in free water  intake -Monitor potassium  SIRS (systemic inflammatory response syndrome) (HCC) Likely secondary to dehydration and hyperkalemia, mild tachycardia, afebrile. No infectious etiology identified, UA, RVP and CXR all unremarkable and WBC normal.  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 -Holding statin - Continue to monitor  Gastrostomy status (HCC) - Continue with tube feed  HTN (hypertension) BP controlled.  Continue meds via G-tube  Diabetes mellitus without complication (HCC) CBG within goal Continue sliding scale insulin  coverage  Chronic diastolic CHF (congestive heart failure) (HCC) Continue GDMT via G-tube   Subjective: Patient was seen and examined today.  He was feeling tired and wants to get some sleep.  Denies any pain  Physical Exam: Vitals:   06/02/24 0407 06/02/24 0507 06/02/24 0825 06/02/24 1317  BP: 112/77  116/74 110/69  Pulse: 78  83 88  Resp: 18  18 15   Temp: (!) 97.5 F (  36.4 C)  (!) 97.4 F  (36.3 C) (!) 97.5 F (36.4 C)  TempSrc:      SpO2: 96%  96% 99%  Weight:  81 kg    Height:       General.  Frail gentleman, in no acute distress. Pulmonary.  Lungs clear bilaterally, normal respiratory effort. CV.  Regular rate and rhythm, no JVD, rub or murmur. Abdomen.  Soft, nontender, nondistended, BS positive.  G-tube in place CNS.  Alert and oriented .  No new focal neurologic deficit. Extremities.  No edema,  pulses intact and symmetrical.  Data Reviewed: Prior data reviewed  Family Communication: Tried calling son with no response  Disposition: Status is: Observation The patient will require care spanning > 2 midnights and should be moved to inpatient because: Severity of illness  Planned Discharge Destination: Skilled nursing facility  DVT prophylaxis.  Lovenox  Time spent: 45 minutes  This record has been created using Conservation officer, historic buildings. Errors have been sought and corrected,but may not always be located. Such creation errors do not reflect on the standard of care.   Author: Amaryllis Dare, MD 06/02/2024 2:42 PM  For on call review [email or web address].  "

## 2024-06-02 NOTE — Progress Notes (Signed)
 Occupational Therapy Treatment Patient Details Name: Juan Harper. MRN: [id number] DOB: [birth year] Today's Date: 06/02/2024   History of present illness Pt is a 64 y.o. 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 MD assessment includes: Hyperkalemia, SIRS, AKI, and abnormal LFTs.   OT comments  Chart reviewed to date, pt greeted semi supine, agreeable to OT tx session. Pt continues to present with dysarthria, answers yes/no questions appropriately. Frequent multi modal cues for sequencing throughout session due to impulsivity. Pt continues to require MINA  for bed mobility, MIN A for STS with RW, amb approx 10' with RW. MIN A required for seated grooming tasks. Pt is left as received, all needs met. OT will follow.   Peg feeds were already paused on pump when thsi thearpist entered room, notified nurse.      If plan is discharge home, recommend the following:  A lot of help with bathing/dressing/bathroom;Assist for transportation;Assistance with cooking/housework;Help with stairs or ramp for entrance;Direct supervision/assist for medications management;A little help with walking and/or transfers;A little help with bathing/dressing/bathroom   Equipment Recommendations  Other (comment) (defer)    Recommendations for Other Services      Precautions / Restrictions Precautions Precautions: Fall Recall of Precautions/Restrictions: Impaired Precaution/Restrictions Comments: PEG Restrictions Weight Bearing Restrictions Per Provider Order: No       Mobility Bed Mobility Overal bed mobility: Needs Assistance Bed Mobility: Supine to Sit, Sit to Supine     Supine to sit: Min assist, Used rails Sit to supine: Min assist   General bed mobility comments: frequent vcs for technique    Transfers Overall transfer level: Needs  assistance Equipment used: Rolling walker (2 wheels) Transfers: Sit to/from Stand Sit to Stand: Min assist                 Balance Overall balance assessment: Needs assistance Sitting-balance support: Feet supported Sitting balance-Leahy Scale: Good     Standing balance support: Bilateral upper extremity supported, Reliant on assistive device for balance, During functional activity Standing balance-Leahy Scale: Fair                             ADL either performed or assessed with clinical judgement   ADL Overall ADL's : Needs assistance/impaired     Grooming: Wash/dry face;Minimal assistance;Sitting Grooming Details (indicate cue type and reason): sitting on edge of bed                 Toilet Transfer: Minimal assistance;Rolling walker (2 wheels);Cueing for sequencing;Cueing for safety;Ambulation Toilet Transfer Details (indicate cue type and reason): simulated         Functional mobility during ADLs: Minimal assistance;Cueing for safety;Cueing for sequencing;Rolling walker (2 wheels) (approx 10' in room)      Extremity/Trunk Assessment              Vision       Perception     Praxis     Communication Communication Communication: Impaired Factors Affecting Communication: Difficulty expressing self;Reduced clarity of speech   Cognition Arousal: Alert Behavior During Therapy: Flat affect, impulsive  Cognition: Difficult to assess Difficult to assess due to: Impaired communication                             Following  commands: Impaired Following commands impaired: Follows one step commands with increased time (with frequent multi modal cues)      Cueing   Cueing Techniques: Verbal cues, Tactile cues, Visual cues, Gestural cues  Exercises      Shoulder Instructions       General Comments      Pertinent Vitals/ Pain       Pain Assessment Pain Assessment: No/denies pain  Home Living                                           Prior Functioning/Environment              Frequency  Min 3X/week        Progress Toward Goals  OT Goals(current goals can now be found in the care plan section)  Progress towards OT goals: Progressing toward goals  Acute Rehab OT Goals Time For Goal Achievement: 06/14/24  Plan      Co-evaluation                 AM-PAC OT 6 Clicks Daily Activity     Outcome Measure   Help from another person eating meals?: A Little Help from another person taking care of personal grooming?: A Little Help from another person toileting, which includes using toliet, bedpan, or urinal?: A Lot Help from another person bathing (including washing, rinsing, drying)?: A Lot Help from another person to put on and taking off regular upper body clothing?: A Little Help from another person to put on and taking off regular lower body clothing?: A Lot 6 Click Score: 15    End of Session Equipment Utilized During Treatment: Rolling walker (2 wheels)  OT Visit Diagnosis: Other abnormalities of gait and mobility (R26.89);Muscle weakness (generalized) (M62.81);Unsteadiness on feet (R26.81)   Activity Tolerance Patient tolerated treatment well   Patient Left with call bell/phone within reach;with bed alarm set;in bed   Nurse Communication Mobility status        Time: 8479-8465 OT Time Calculation (min): 14 min  Charges: OT General Charges $OT Visit: 1 Visit OT Treatments $Therapeutic Activity: 8-22 mins  Therisa Sheffield, OTD OTR/L  06/02/2024, 3:53 PM

## 2024-06-02 NOTE — Plan of Care (Signed)

## 2024-06-02 NOTE — Inpatient Diabetes Management (Signed)
 Inpatient Diabetes Program Recommendations  AACE/ADA: New Consensus Statement on Inpatient Glycemic Control   Target Ranges:  Prepandial:   less than 140 mg/dL      Peak postprandial:   less than 180 mg/dL (1-2 hours)      Critically ill patients:  140 - 180 mg/dL    Latest Reference Range & Units 06/01/24 07:46 06/01/24 11:25 06/01/24 15:17 06/01/24 19:21 06/01/24 23:46 06/02/24 04:37 06/02/24 08:26  Glucose-Capillary 70 - 99 mg/dL 792 (H)  Novolog  5 units 112 (H) 183 (H)  Novolog  3 units 139 (H) 212 (H)  Novolog  5 units 225 (H)  Novolog  5 units 90   Review of Glycemic Control  Diabetes history: DM2 Outpatient Diabetes medications: Farxiga  10 mg daily, Metformin  500 mg BID, Novolog  0-6 units TID Current orders for Inpatient glycemic control: Novolog  0-15 units Q4H, Farxiga  10 mg daily; Osmolite @ 60 ml/hr  Inpatient Diabetes Program Recommendations:    Insulin : Please consider ordering Semglee  5 units Q24H and decrease Novolog  correction to 0-9 units Q4H.  Thanks, Earnie Gainer, RN, MSN, CDCES Diabetes Coordinator Inpatient Diabetes Program [phone] (Team Pager from 8am to 5pm)

## 2024-06-02 NOTE — Progress Notes (Signed)
 Physical Therapy Treatment Patient Details Name: Juan Harper. MRN: [id number] DOB: 07/02/1960 Today's Date: 06/02/2024   History of Present Illness Pt is a 64 y.o. 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 MD assessment includes: Hyperkalemia, SIRS, AKI, and abnormal LFTs.    PT Comments  Pt was pleasant and motivated to participate during the session and put forth good effort throughout. Pt with followed 1-step commands with only min extra time and cuing this session and required only min physical assist with bed mobility tasks but no assist with transfers or amb this session.  Pt did require some cuing for general sequencing and safety with each but was generally steady with no overt LOB or adverse symptoms during standing tasks.  Pt will benefit from continued PT services upon discharge to safely address deficits listed in patient problem list for decreased caregiver assistance and eventual return to PLOF.      If plan is discharge home, recommend the following: A little help with walking and/or transfers;A little help with bathing/dressing/bathroom;Assistance with cooking/housework;Direct supervision/assist for medications management;Assist for transportation;Help with stairs or ramp for entrance   Can travel by private vehicle     Yes  Equipment Recommendations  Other (comment) (TBD)    Recommendations for Other Services       Precautions / Restrictions Precautions Precautions: Fall Precaution/Restrictions Comments: PEG Restrictions Weight Bearing Restrictions Per Provider Order: No     Mobility  Bed Mobility Overal bed mobility: Needs Assistance Bed Mobility: Supine to Sit     Supine to sit: Min assist, Used rails     General bed mobility comments: Min A for trunk control and min cues for general  sequencing    Transfers Overall transfer level: Needs assistance Equipment used: Rolling walker (2 wheels) Transfers: Sit to/from Stand Sit to Stand: Contact guard assist           General transfer comment: Min verbal cues for hand placement but no physical assist needed to come to standing this session from EOB or recliner    Ambulation/Gait Ambulation/Gait assistance: Contact guard assist Gait Distance (Feet): 20 Feet Assistive device: Rolling walker (2 wheels) Gait Pattern/deviations: Shuffle, Step-through pattern, Decreased step length - right, Decreased step length - left Gait velocity: decreased     General Gait Details: Pt able to amb 2 x 20' this session with noted slight walker/trunk rotation to the right (clockwise) while ambulating but no adverse symptoms or overt LOB noted   Stairs             Wheelchair Mobility     Tilt Bed    Modified Rankin (Stroke Patients Only)       Balance Overall balance assessment: Needs assistance Sitting-balance support: Feet supported Sitting balance-Leahy Scale: Good     Standing balance support: Bilateral upper extremity supported, Reliant on assistive device for balance, During functional activity Standing balance-Leahy Scale: Fair                              Hotel Manager: Impaired Factors Affecting Communication: Difficulty expressing self;Reduced clarity of speech  Cognition Arousal: Alert Behavior During Therapy: Flat affect   PT - Cognitive impairments: Difficult to assess Difficult to assess due to: Impaired communication  Following commands: Impaired Following commands impaired: Follows one step commands with increased time    Cueing Cueing Techniques: Verbal cues, Tactile cues, Visual cues  Exercises Total Joint Exercises Long Arc Quad: AROM, Strengthening, Both, 5 reps Knee Flexion: AROM, Strengthening, Both, 5 reps     General Comments        Pertinent Vitals/Pain Pain Assessment Pain Assessment: No/denies pain    Home Living                          Prior Function            PT Goals (current goals can now be found in the care plan section) Progress towards PT goals: Progressing toward goals    Frequency    Min 2X/week      PT Plan      Co-evaluation              AM-PAC PT 6 Clicks Mobility   Outcome Measure  Help needed turning from your back to your side while in a flat bed without using bedrails?: A Little Help needed moving from lying on your back to sitting on the side of a flat bed without using bedrails?: A Little Help needed moving to and from a bed to a chair (including a wheelchair)?: A Little Help needed standing up from a chair using your arms (e.g., wheelchair or bedside chair)?: A Little Help needed to walk in hospital room?: A Little Help needed climbing 3-5 steps with a railing? : A Lot 6 Click Score: 17    End of Session Equipment Utilized During Treatment: Gait belt Activity Tolerance: Patient tolerated treatment well Patient left: in chair;with call bell/phone within reach;with chair alarm set;Other (comment) (nursing applied lap alarm at end of session) Nurse Communication: Mobility status PT Visit Diagnosis: Unsteadiness on feet (R26.81);Muscle weakness (generalized) (M62.81);Difficulty in walking, not elsewhere classified (R26.2)     Time: 8572-8550 PT Time Calculation (min) (ACUTE ONLY): 22 min  Charges:    $Gait Training: 8-22 mins PT General Charges $$ ACUTE PT VISIT: 1 Visit                     D. Scott Jerrilyn Messinger PT, DPT 06/02/2024, 3:13 PM

## 2024-06-02 NOTE — Progress Notes (Signed)
 Patient is restraint and sitter free.

## 2024-06-02 NOTE — Assessment & Plan Note (Signed)
 Likely secondary to dehydration and hyperkalemia, mild tachycardia, afebrile. No infectious etiology identified, UA, RVP and CXR all unremarkable and WBC normal.  Sepsis ruled out Suspect dehydration Holding off on antibiotics

## 2024-06-02 NOTE — Progress Notes (Signed)
 Nutrition Follow-up  DOCUMENTATION CODES:   Non-severe (moderate) malnutrition in context of social or environmental circumstances  INTERVENTION:   -TF via PEG:   Osmolite 1.5 @ 60 ml/hr   60 ml Prosource TF20 BID     250 ml free water  flush every 4 hours   Tube feeding regimen provides 2320 kcal (100% of needs), 130 grams of protein, and 1097 ml of H2O. Total free water : 2597 ml daily  -RD discussed and consulted with DM coordinator regarding persistent hyperglycemia  NUTRITION DIAGNOSIS:   Moderate Malnutrition related to social / environmental circumstances as evidenced by mild fat depletion, moderate fat depletion, mild muscle depletion, moderate muscle depletion.  Ongoing  GOAL:   Patient will meet greater than or equal to 90% of their needs  Met with TF  MONITOR:   TF tolerance, Diet advancement  REASON FOR ASSESSMENT:   Consult Assessment of nutrition requirement/status  ASSESSMENT:   64 y.o. 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  12/28- PEG placed by GI   Reviewed I/O's: -64 ml x 24 hours and -5.1 L since admission  UOP: 2.2 L x 24 hours   Patient is currently restraint and sitter free per RN. Safety sitter and restraints were added on 06/01/24.   Patient receiving nursing care at time of visit.   Patient is NPO and receives sole source nutrition via PEG. Osmolite 1.5 infusing at goal rate of 60 ml/hr.   Case discussed with MD and pharmacy, who is requesting RD re-evaluate TF formula due to continue hyperkalemia, which has ranged 4.8-6.5 since admission. K levels initially improved with addition of free water  flushes (adjusted on 05/31/24), but have now risen to 5.2 today.   Weight has ranged from 81-82.1 kg since last visit.   Per TOC notes, plan for alternative SNF placement at  discharge. Per TOC, prior SNF ([facility]) is unable to take patient back as they are unable to meet his needs.   Medications reviewed and include plavix , vitamin B-12, lovenox , melatonin, veltassa , and miralax .   Labs reviewed: Na: 134, K: 5.2, CBGS: 90-225 (inpatient orders for glycemic control are 0-15 units insulin  aspart every 4 hours).    Diet Order:   Diet Order             Diet NPO time specified  Diet effective now                   EDUCATION NEEDS:   Not appropriate for education at this time  Skin:  Skin Assessment: Reviewed RN Assessment  Last BM:  05/31/24 (type 4)  Height:   Ht Readings from Last 1 Encounters:  05/28/24 5' 11 (1.803 m)    Weight:   Wt Readings from Last 1 Encounters:  06/02/24 81 kg    Ideal Body Weight:  78.2 kg  BMI:  Body mass index is 24.91 kg/m.  Estimated Nutritional Needs:   Kcal:  2300-2500  Protein:  115-130 grams  Fluid:  2.0-2.2 L    Margery ORN, RD, LDN, CDCES Registered Dietitian III Certified Diabetes Care and Education Specialist If unable to reach this RD, please use RD Inpatient group chat on secure chat between hours of 8am-4 pm daily

## 2024-06-03 DIAGNOSIS — E860 Dehydration: Secondary | ICD-10-CM | POA: Diagnosis not present

## 2024-06-03 DIAGNOSIS — R6510 Systemic inflammatory response syndrome (SIRS) of non-infectious origin without acute organ dysfunction: Secondary | ICD-10-CM | POA: Diagnosis not present

## 2024-06-03 DIAGNOSIS — E875 Hyperkalemia: Secondary | ICD-10-CM | POA: Diagnosis not present

## 2024-06-03 DIAGNOSIS — N179 Acute kidney failure, unspecified: Secondary | ICD-10-CM | POA: Diagnosis not present

## 2024-06-03 LAB — BASIC METABOLIC PANEL WITH GFR
Anion gap: 12 (ref 5–15)
BUN: 41 mg/dL — ABNORMAL HIGH (ref 8–23)
CO2: 25 mmol/L (ref 22–32)
Calcium: 9.1 mg/dL (ref 8.9–10.3)
Chloride: 96 mmol/L — ABNORMAL LOW (ref 98–111)
Creatinine, Ser: 1.07 mg/dL (ref 0.61–1.24)
GFR, Estimated: >60 mL/min
Glucose, Bld: 218 mg/dL — ABNORMAL HIGH (ref 70–99)
Potassium: 4.9 mmol/L (ref 3.5–5.1)
Sodium: 133 mmol/L — ABNORMAL LOW (ref 135–145)

## 2024-06-03 LAB — GLUCOSE, CAPILLARY
Glucose-Capillary: 124 mg/dL — ABNORMAL HIGH (ref 70–99)
Glucose-Capillary: 136 mg/dL — ABNORMAL HIGH (ref 70–99)
Glucose-Capillary: 149 mg/dL — ABNORMAL HIGH (ref 70–99)
Glucose-Capillary: 183 mg/dL — ABNORMAL HIGH (ref 70–99)
Glucose-Capillary: 192 mg/dL — ABNORMAL HIGH (ref 70–99)

## 2024-06-03 MED ORDER — PROSOURCE TF20 ENFIT COMPATIBL EN LIQD: 60.0000 mL | Freq: Every day | ENTERAL | Status: DC

## 2024-06-03 MED ORDER — FREE WATER: 200.0000 mL | Status: AC

## 2024-06-03 MED ORDER — NEPRO/CARBSTEADY PO LIQD: 237.0000 mL | Freq: Every day | ORAL | Status: AC

## 2024-06-03 MED ORDER — POLYETHYLENE GLYCOL 3350 17 G PO PACK: 17.0000 g | PACK | Freq: Every day | ORAL | Status: AC | PRN

## 2024-06-03 MED ORDER — FREE WATER: 230.0000 mL | Freq: Every day | Status: AC

## 2024-06-03 MED ORDER — FREE WATER
230.0000 mL | Freq: Every day | Status: DC
  Administered 2024-06-03: 230 mL

## 2024-06-03 MED ORDER — QUETIAPINE FUMARATE 25 MG PO TABS: 25.0000 mg | ORAL_TABLET | Freq: Every day | ORAL | Status: AC

## 2024-06-03 MED ORDER — INSULIN GLARGINE 100 UNIT/ML ~~LOC~~ SOLN
5.0000 [IU] | Freq: Every day | SUBCUTANEOUS | Status: DC
  Administered 2024-06-03: 5 [IU] via SUBCUTANEOUS
  Filled 2024-06-03: qty 0.05

## 2024-06-03 MED ORDER — FREE WATER
200.0000 mL | Status: DC
  Administered 2024-06-03: 200 mL

## 2024-06-03 MED ORDER — NEPRO/CARBSTEADY PO LIQD
237.0000 mL | Freq: Every day | ORAL | Status: DC
  Administered 2024-06-03 (×2): 237 mL

## 2024-06-03 MED ORDER — PROSOURCE TF20 ENFIT COMPATIBL EN LIQD: 60.0000 mL | Freq: Every day | ENTERAL | Status: AC

## 2024-06-03 MED ORDER — NEPRO/CARBSTEADY PO LIQD
1000.0000 mL | ORAL | Status: DC
  Administered 2024-06-03: 1000 mL

## 2024-06-03 MED ORDER — NEPRO/CARBSTEADY PO LIQD: 1000.0000 mL | ORAL | Status: AC

## 2024-06-03 MED ORDER — SENNOSIDES 8.8 MG/5ML PO SYRP: 10.0000 mL | ORAL_SOLUTION | Freq: Every day | ORAL | Status: AC

## 2024-06-03 NOTE — Inpatient Diabetes Management (Signed)
 Inpatient Diabetes Program Recommendations  AACE/ADA: New Consensus Statement on Inpatient Glycemic Control   Target Ranges:  Prepandial:   less than 140 mg/dL      Peak postprandial:   less than 180 mg/dL (1-2 hours)      Critically ill patients:  140 - 180 mg/dL    Latest Reference Range & Units 06/01/24 23:46 06/02/24 04:37 06/02/24 08:26 06/02/24 13:01 06/02/24 16:15 06/02/24 20:02 06/03/24 00:33 06/03/24 05:05  Glucose-Capillary 70 - 99 mg/dL 787 (H)  Novolog  5 units  225 (H)  Novolog  5 units  90    Farxiga  10 mg @10 :10 154 (H)  Novolog  3 units  121 (H)  Novolog  2 units  96 192 (H)  Novolog  3 units  183 (H)  Novolog  3 units    Review of Glycemic Control  Diabetes history: DM2 Outpatient Diabetes medications: Farxiga  10 mg daily, Metformin  500 mg BID, Novolog  0-6 units TID Current orders for Inpatient glycemic control: Novolog  0-15 units Q4H, Farxiga  10 mg daily; Osmolite @ 60 ml/hr   Inpatient Diabetes Program Recommendations:     Insulin : Please consider ordering Semglee  5 units Q24H and decrease Novolog  correction to 0-9 units Q4H.  Thanks, Earnie Gainer, RN, MSN, CDCES Diabetes Coordinator Inpatient Diabetes Program [phone] (Team Pager from 8am to 5pm)

## 2024-06-03 NOTE — Discharge Summary (Addendum)
 " Physician Discharge Summary   Patient: Juan Harper. MRN: [id number] DOB: [birth year]  Admit date:     05/28/2024  Discharge date: 06/03/2024  Discharge Physician: Amaryllis Dare   PCP: Sharl Delon NOVAK, NP   Recommendations at discharge:  Please check potassium on Monday, need monitoring every 2 to 3 days until stabilizes. His feeding formula has been changed to Nepro as prior formula continue to increase his potassium although renal functions are normal.  Continues tube feeds switched with bolus Please increase free water  intake to 200-250 mL every 4 hourly. Please avoid constipation You can continue using Kayexalate until potassium stabilizes or started trending low. Please check CMP on Monday and resume statin if improving liver functions. 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  Juan Harper. is a 64 y.o. 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  He was otherwise at baseline.   On presentation vitals with tachycardia at 123, BP 91/67.  Labs with potassium of 6.5, creatinine 1.39,  elevated LFTs, AST/ALT 236/362 with alk phos 130 and normal bili.CBC with normal WBC of 9.2.  Hemoglobin 11.7 down from 13.5 a couple weeks prior,Respiratory panel negative for COVID flu and RSV and urinalysis  unremarkable. EKG showed questionable a flutter at 103, possible second-degree heart block-changed from last EKG on 12/18. Chest x-ray negative for any acute abnormality. RUQ ultrasound suggesting hepatic steatosis. CBD 3 mm.  Patient received insulin  and dextrose , normal saline and Veltassa  via G-tube.  1/4: Hemodynamically stable, potassium improved to 5.8>>5.1, creatinine improved to 1.21 with baseline around 1.    1/5: Remained hemodynamically stable with worsening hyperkalemia again, potassium at 5.2, hyponatremia improved.  Adjusting tube feed for low potassium formula, adding bowel regimen and increasing the dose of Veltassa  to 16 g. Hepatic function started improving-holding statin.  1/6: Remained hemodynamically stable, potassium normalized.  PT and OT are recommending SNF, apparently required another insurance authorization in order to go back to his SNF.  1/7: Remained hemodynamically stable with improving transaminitis.  Obtained insurance authorization to go back to SNF.  Apparently a sitter was placed due to some agitation overnight so SNF will not take him back today.  Sitter was discontinued.  1/8: Hemodynamically stable, potassium at 5.2, increasing free water .  Feeding formula switched with Nepro.  1/9: Remained hemodynamically stable.  Potassium at 4.9.  Patient is being discharged on Nepro and Prosource.  He will need a close monitoring of his potassium, should be checked every 2 to 3 days until stable.  Avoid high potassium formula.  Free water  intake was also increased.  We continued Kayexalate which was recently started at facility, they can discontinue Kayexalate if potassium remained normal or can use as needed.  Patient should avoid constipation and will continue on current medications.  Need to have a close follow-up with his providers for further assistance.  Assessment and Plan: * Hyperkalemia Significantly elevated potassium on admission s/p medical management  and Veltassa . Potassium at 4.9 today His feeding formula was changed with Nepro as his potassium keeps rising with Osmolite despite increasing free water  intake. Free water  intake was also increased Patient needed close monitoring of potassium and continue using Kayexalate as needed at his facility.  SIRS (systemic inflammatory response syndrome) (HCC) Likely secondary to dehydration and hyperkalemia, mild tachycardia, afebrile. No infectious etiology identified, UA, RVP and CXR all unremarkable and WBC normal.  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 -Holding statin-for another week and then it can be resumed as liver functions continue to improve - Continue to monitor  Gastrostomy status (HCC) - Continue with tube feed  HTN (hypertension) BP controlled.  Continue meds via G-tube  Diabetes mellitus without complication (HCC) CBG within goal Continue sliding scale insulin  coverage  Chronic diastolic CHF (congestive heart failure) (HCC) Continue GDMT via G-tube     Consultants: None Procedures performed: None Disposition: Skilled nursing facility Diet recommendation:  NPO on tube feed DISCHARGE MEDICATION: Allergies as of 06/03/2024       Reactions   Bee Venom Itching        Medication List     PAUSE taking these medications    atorvastatin  80 MG tablet Wait to take this until: June 06, 2024 Commonly known as: LIPITOR  Place 1 tablet (80 mg total) into feeding tube daily.       TAKE these  medications    acetaminophen  325 MG tablet Commonly known as: TYLENOL  Take 650 mg by mouth every 6 (six) hours as needed.   albuterol  108 (90 Base) MCG/ACT inhaler Commonly known as: VENTOLIN  HFA Inhale 2 puffs into the lungs every 6 (six) hours as needed for wheezing or shortness of breath.   amLODipine  10 MG tablet Commonly known as: NORVASC  Place 1 tablet (10 mg total) into feeding tube daily.   aspirin  81 MG chewable tablet Place 1 tablet (81 mg total) into feeding tube daily.   budesonide-formoterol  80-4.5 MCG/ACT inhaler Commonly known as: SYMBICORT Inhale 2 puffs into the lungs 2 (two) times daily.   clopidogrel  75 MG tablet Commonly known as: PLAVIX  Place 1 tablet (75 mg total) into feeding tube daily.   cyanocobalamin  1000 MCG tablet Commonly known as: VITAMIN B12 Place 1 tablet (1,000 mcg total) into feeding tube daily.   diclofenac sodium 1 % Gel Commonly known as: VOLTAREN Apply 2 g topically 2 (two) times daily.   eucerin cream Apply 1 Application topically 3 (three) times daily.   Farxiga  10 MG Tabs tablet Generic drug: dapagliflozin  propanediol Take 10 mg by mouth daily.   feeding supplement (NEPRO CARB STEADY) Liqd Place 1,000 mLs into feeding tube continuous. What changed: Another medication with the same name was added. Make sure you understand how and when to take each.   feeding supplement (NEPRO CARB STEADY) Liqd Place 237 mLs into feeding tube 5 (five) times daily. What changed: You were already taking a medication with the same name, and this prescription was added. Make sure you understand how and when to take each.  feeding supplement (PROSource TF20) liquid Place 60 mLs into feeding tube 2 (two) times daily. What changed: Another medication with the same name was added. Make sure you understand how and when to take each.   feeding supplement (PROSource TF20) liquid Place 60 mLs into feeding tube daily. Start taking on: June 04, 2024 What changed: You were already taking a medication with the same name, and this prescription was added. Make sure you understand how and when to take each.   free water  Soln Place 200 mLs into feeding tube every 4 (four) hours. What changed: how much to take   free water  Soln Place 200 mLs into feeding tube every 4 (four) hours. What changed: You were already taking a medication with the same name, and this prescription was added. Make sure you understand how and when to take each.   free water  Soln Place 230 mLs into feeding tube 5 (five) times daily. What changed: You were already taking a medication with the same name, and this prescription was added. Make sure you understand how and when to take each.   insulin  aspart 100 UNIT/ML FlexPen Commonly known as: NOVOLOG  Inject 0-6 Units into the skin 3 (three) times daily before meals using sliding scale. What changed:  how much to take additional instructions   ipratropium 17 MCG/ACT inhaler Commonly known as: ATROVENT  HFA Inhale 2 puffs into the lungs every 6 (six) hours.   Lokelma 10 g Pack packet Generic drug: sodium zirconium cyclosilicate Take 10 g by mouth 2 (two) times daily.   Melatonin 5 MG Caps 1 capsule (5 mg total) by PEG Tube route daily.   metFORMIN  500 MG tablet Commonly known as: GLUCOPHAGE  Take 500 mg by mouth 2 (two) times daily with a meal.   multivitamin with minerals Tabs tablet Place 1 tablet into feeding tube daily.   polyethylene glycol 17 g packet Commonly known as: MIRALAX  / GLYCOLAX  Place 17 g into feeding tube daily as needed.   QUEtiapine  25 MG tablet Commonly known as: SEROQUEL  Place 1 tablet (25 mg total) into feeding tube at bedtime.   sennosides 8.8 MG/5ML syrup Commonly known as: SENOKOT Place 10 mLs into feeding tube at bedtime.        Follow-up Information     Sharl Delon NOVAK, NP. Schedule an appointment as soon as possible for a visit in 1 week(s).   Specialty:  Nurse Practitioner Contact information: [address] [place] [address] [phone]                Discharge Exam: Filed Weights   06/01/24 0540 06/02/24 0507 06/03/24 0529  Weight: 81.3 kg 81 kg 81.1 kg   General.  Frail gentleman, in no acute distress. Pulmonary.  Lungs clear bilaterally, normal respiratory effort. CV.  Regular rate and rhythm, no JVD, rub or murmur. Abdomen.  Soft, nontender, nondistended, BS positive. CNS.  Alert and oriented .  No new focal neurologic deficit. Extremities.  No edema,  pulses intact and symmetrical.   Condition at discharge: stable  The results of significant diagnostics from this hospitalization (including imaging, microbiology, ancillary and laboratory) are listed below for reference.   Imaging Studies: US  ABDOMEN LIMITED RUQ (LIVER/GB) Result Date: 05/28/2024 EXAM: Right Upper Quadrant Abdominal Ultrasound 05/28/2024 08:24:02 PM TECHNIQUE: Real-time ultrasonography of the right upper quadrant of the abdomen was performed. COMPARISON: CT abdomen and pelvis 05/18/2024. CLINICAL HISTORY: Elevated LFTs. FINDINGS: LIVER: Increased hepatic parenchymal echotexture suggesting fatty infiltration. No focal lesions. No intrahepatic biliary  ductal dilatation. Hepatopetal flow in the portal vein. BILIARY SYSTEM: No pericholecystic fluid or wall thickening. No cholelithiasis. Common bile duct is within normal limits measuring 3 mm. RIGHT KIDNEY: No hydronephrosis. No echogenic calculi. No mass. PANCREAS: Pancreas is not visualized due to overlying bowel gas. OTHER: No right upper quadrant ascites. IMPRESSION: 1. Increased hepatic parenchymal echotexture suggesting hepatic steatosis. No focal hepatic lesions. 2. Common bile duct measures 3 mm. Electronically signed by: Elsie Gravely MD 05/28/2024 08:35 PM EST RP Workstation: [id number]   DG Chest Port 1 View Result Date: 05/28/2024 CLINICAL DATA:  Possible sepsis. EXAM: PORTABLE CHEST 1 VIEW  COMPARISON:  05/14/2024. FINDINGS: The heart size and mediastinal contours are within normal limits. There is atherosclerotic calcification of the aorta. No consolidation, effusion, or pneumothorax is seen. No acute osseous abnormality. IMPRESSION: No active disease. Electronically Signed   By: Leita Birmingham M.D.   On: 05/28/2024 18:10   DG Abd 1 View Result Date: 05/20/2024 EXAM: 1 VIEW XRAY OF THE ABDOMEN 05/20/2024 01:41:55 PM COMPARISON: 05/17/2024 CLINICAL HISTORY: Encounter for feeding tube placement FINDINGS: LINES, TUBES AND DEVICES: Enteric tube in place with distal tip and side port terminating within the expected location of the gastric body. BOWEL: Nonobstructive bowel gas pattern. SOFT TISSUES: No abnormal calcifications. BONES: No acute fracture. IMPRESSION: 1. Enteric tube tip and side port overlying the stomach. Electronically signed by: Donnice Mania MD 05/20/2024 02:12 PM EST RP Workstation: [id number]   CT ABDOMEN PELVIS WO CONTRAST Result Date: 05/18/2024 CLINICAL DATA:  Assess anatomy prior to potential PEG placement. EXAM: CT ABDOMEN AND PELVIS WITHOUT CONTRAST TECHNIQUE: Multidetector CT imaging of the abdomen and pelvis was performed following the standard protocol without IV contrast. RADIATION DOSE REDUCTION: This exam was performed according to the departmental dose-optimization program which includes automated exposure control, adjustment of the mA and/or kV according to patient size and/or use of iterative reconstruction technique. COMPARISON:  None Available. FINDINGS: Lower chest: There are subpleural atelectatic changes in the visualized lung bases. No overt consolidation. No pleural effusion. The heart is normal in size. No pericardial effusion. Hepatobiliary: The liver is normal in size. Non-cirrhotic configuration. No suspicious mass. No intrahepatic or extrahepatic bile duct dilation. No calcified gallstones. Normal gallbladder wall thickness. No pericholecystic  inflammatory changes. Pancreas: Unremarkable. No pancreatic ductal dilatation or surrounding inflammatory changes. Spleen: Within normal limits. No focal lesion. Adrenals/Urinary Tract: Adrenal glands are unremarkable. No suspicious renal mass within the limitations of this unenhanced exam. There is a simple cortical cyst arising from the left kidney lower pole, posterolaterally measuring 1.7 x 2.1 cm. No nephroureterolithiasis or obstructive uropathy. Unremarkable urinary bladder. Stomach/Bowel: Normal stomach anatomy. Enteric tube is seen with its tip in the duodenal bulb. No disproportionate dilation of the small or large bowel loops. No evidence of abnormal bowel wall thickening or inflammatory changes. The appendix is unremarkable. Vascular/Lymphatic: No ascites or pneumoperitoneum. No abdominal or pelvic lymphadenopathy, by size criteria. No aneurysmal dilation of the major abdominal arteries. There are moderate peripheral atherosclerotic vascular calcifications of the aorta and its major branches. Reproductive: Enlarged prostate. Symmetric seminal vesicles. Other: There are bilateral small fat containing inguinal hernias. There is also a tiny fat containing umbilical hernia. The soft tissues and abdominal wall are otherwise unremarkable. Musculoskeletal: No suspicious osseous lesions. There are mild - moderate multilevel degenerative changes in the visualized spine. IMPRESSION: 1. No acute inflammatory process identified within the abdomen or pelvis. No upper gastrointestinal tract obstruction. 2. Multiple other nonacute observations, as described above. Aortic  Atherosclerosis (ICD10-I70.0). Electronically Signed   By: Ree Molt M.D.   On: 05/18/2024 14:24   DG Abd 1 View Result Date: 05/17/2024 CLINICAL DATA:  NG tube placement EXAM: ABDOMEN - 1 VIEW COMPARISON:  05/15/2024 FINDINGS: Enteric tube tip overlies the distal stomach. Enteral contrast within the colon. IMPRESSION: Enteric tube tip  overlies the distal stomach. Electronically Signed   By: Luke Bun M.D.   On: 05/17/2024 23:42   DG Swallowing Func-Speech Pathology Result Date: 05/16/2024 Table formatting from the original result was not included. Modified Barium Swallow Study Patient Details Name: BEVERLY SURIANO MRN: [id number] Date of Birth: [birth year] Today's Date: 05/16/2024 HPI/PMH: HPI: Per H&P, YUNIOR JAIN is a 64 y.o. year old male with medical history of HTN, HLD, DMII,  presenting to the ED after having worsening slurred speech. MRI: 1. Acute nonhemorrhagic 10mm infarct in the lateral aspect of the thalamus and  posterior limb of the left internal capsule.  2. Two acute punctate infarcts in the posterior limb of the right internal  capsule.  3. Moderate atrophy and confluent periventricular white matter changes  extending to the subcortical regions bilaterally. This most likely reflects the  sequelae of chronic microvascular ischemia.  4. Remote lacunar infarcts in the inferior cerebellum bilaterally and remote  ischemic changes extending into the brainstem.   Patient was symptomatic for over 24 hours before arrival to ED.  Family is concerned about his living situation.  Toxicology report indicating positive for cocaine. Clinical Impression: Clinical Impression: Pt presents with moderate oropharyngeal dysphagia. Pharyngeal weakness notable for reduced BOT retraction, pharyngeal constriction, hyolaryngeal excursion, UES opening, and laryngeal closure, facilitating aspiration/penetration during/before swallow with thin liquids and trace penetration after the swallow of more viscous trials (thickened liquids). Pt not consistently sensate to aspiration of thin liquids. Residue collecting with majority at the vallecula and min at pyriform sinus. Cognition limiting use of compensatory strategies like triple swallow and effortful swallow to aid residue clearance. Oral phase significantly prolonged with intermittent oral holding,  lingual pumping, piecemeal deglutition, and reduced labial seal, leading to anterior loss and trace-min oral residue. Safest recommendation would be NPO with allowance of medications crushed in puree. SLP will follow up with therapeutic trials and introduction of pharyngeal strengthening methods. Pt likely to need time for recovery - would consider PEG. MD, RN, and dietician aware of recommendations. Factors that may increase risk of adverse event in presence of aspiration [name]: Factors that may increase risk of adverse event in presence of aspiration [name]: Reduced cognitive function; Limited mobility; Frail or deconditioned; Dependence for feeding and/or oral hygiene; Weak cough; Presence of tubes (ETT, trach, NG, etc.) Recommendations/Plan: Swallowing Evaluation Recommendations Swallowing Evaluation Recommendations Recommendations: NPO except meds Medication Administration: Crushed with puree Oral care recommendations: Oral care QID (4x/day); Staff/trained caregiver to provide oral care Treatment Plan Treatment Plan Treatment recommendations: Therapy as outlined in treatment plan below Follow-up recommendations: Follow physicians's recommendations for discharge plan and follow up therapies Functional status assessment: Patient has had a recent decline in their functional status and/or demonstrates limited ability to make significant improvements in function in a reasonable and predictable amount of time. Treatment frequency: Min 2x/week Treatment duration: 2 weeks Interventions: Oropharyngeal exercises; Compensatory techniques; Patient/family education; Aspiration precaution training Recommendations Recommendations for follow up therapy are one component of a multi-disciplinary discharge planning process, led by the attending physician.  Recommendations may be updated based on patient status, additional functional criteria and insurance authorization. Assessment:  Orofacial  Exam: Orofacial Exam Oral Cavity: Oral Hygiene: WFL Oral Cavity - Dentition: Edentulous Orofacial Anatomy: WFL Oral Motor/Sensory Function: Suspected cranial nerve impairment CN V - Trigeminal: Not tested CN VII - Facial: Right motor impairment CN IX - Glossopharyngeal, CN X - Vagus: WFL CN XII - Hypoglossal: WFL Anatomy: Anatomy: Presence of cervical hardware (Cervical Fusion ACDF and corpectomy 09/08/16) Boluses Administered: Boluses Administered Boluses Administered: Thin liquids (Level 0); Mildly thick liquids (Level 2, nectar thick); Moderately thick liquids (Level 3, honey thick); Puree  Oral Impairment Domain: Oral Impairment Domain Lip Closure: Escape beyond mid-chin Tongue control during bolus hold: Not tested Bolus preparation/mastication: Disorganized chewing/mashing with solid pieces of bolus unchewed Bolus transport/lingual motion: Repetitive/disorganized tongue motion Oral residue: Residue collection on oral structures Location of oral residue : Tongue; Floor of mouth Initiation of pharyngeal swallow : Pyriform sinuses  Pharyngeal Impairment Domain: Pharyngeal Impairment Domain Soft palate elevation: No bolus between soft palate (SP)/pharyngeal wall (PW) Laryngeal elevation: Partial superior movement of thyroid cartilage/partial approximation of arytenoids to epiglottic petiole Anterior hyoid excursion: Partial anterior movement Epiglottic movement: Partial inversion Laryngeal vestibule closure: Incomplete, narrow column air/contrast in laryngeal vestibule Pharyngeal stripping wave : Present - diminished Pharyngeal contraction (A/P view only): N/A Pharyngoesophageal segment opening: Partial distention/partial duration, partial obstruction of flow Tongue base retraction: Narrow column of contrast or air between tongue base and PPW Pharyngeal residue: Majority of contrast within or on pharyngeal structures Location of pharyngeal residue: Valleculae; Pyriform sinuses  Esophageal Impairment Domain:  Esophageal Impairment Domain Esophageal clearance upright position: Complete clearance, esophageal coating Pill: Pill Consistency administered: -- (n/a) Penetration/Aspiration Scale Score: Penetration/Aspiration Scale Score 1.  Material does not enter airway: Puree 3.  Material enters airway, remains ABOVE vocal cords and not ejected out: Mildly thick liquids (Level 2, nectar thick); Moderately thick liquids (Level 3, honey thick); Thin liquids (Level 0) (penetration after the swallow- secondary to residue- shallow; noted with thin liquid during the swallow) 8.  Material enters airway, passes BELOW cords without attempt by patient to eject out (silent aspiration) : Thin liquids (Level 0) (before swallow) Compensatory Strategies: Compensatory Strategies Compensatory strategies: Yes Straw: Ineffective Ineffective Straw: Mildly thick liquid (Level 2, nectar thick); Thin liquid (Level 0) Effortful swallow: Ineffective Ineffective Effortful Swallow: Moderately thick liquid (Level 3, honey thick) Multiple swallows: Ineffective Ineffective Multiple Swallows: Mildly thick liquid (Level 2, nectar thick); Moderately thick liquid (Level 3, honey thick)   General Information: Caregiver present: No  Diet Prior to this Study: NPO   Temperature : Normal   Respiratory Status: WFL   Supplemental O2: None (Room air)   History of Recent Intubation: No  Behavior/Cognition: Alert; Distractible Self-Feeding Abilities: Dependent for feeding (attempted to hold cup with ineffective feeding) Baseline vocal quality/speech: Hypophonia/low volume Volitional Cough: Unable to elicit Volitional Swallow: Able to elicit Exam Limitations: Poor positioning (shoulders slightly elevated) Goal Planning: Prognosis for improved oropharyngeal function: Fair Barriers to Reach Goals: Cognitive deficits; Motivation; Time post onset; Severity of deficits No data recorded Patient/Family Stated Goal: none stated Consulted and agree with results and  recommendations: Patient; Physician; Nurse; Dietitian Pain: Pain Assessment Pain Assessment: Faces Faces Pain Scale: 0 Breathing: 0 Negative Vocalization: 1 Facial Expression: 0 Body Language: 1 Consolability: 1 PAINAD Score: 3 End of Session: Start Time:SLP Start Time (ACUTE ONLY): 0830 Stop Time: SLP Stop Time (ACUTE ONLY): 0915 Time Calculation:SLP Time Calculation (min) (ACUTE ONLY): 45 min Charges: SLP Evaluations $ SLP Speech Visit: 1 Visit SLP Evaluations $MBS Swallow: 1 Procedure $Swallowing  Treatment: 1 Procedure SLP visit diagnosis: SLP Visit Diagnosis: Dysphagia, oropharyngeal phase (R13.12) Past Medical History: Past Medical History: Diagnosis Date  Arthritis   NECK/ RIGHT KNEE  Asthma   Bell's palsy   BPH (benign prostatic hyperplasia)   Cough   chronic  Diabetes mellitus without complication (HCC)   Edema   legs/feet  Fusion of spine of cervical region   PLANNED FOR APRIL  HOH (hard of hearing)   Hypertension   Orthopnea   Shortness of breath dyspnea   Sleep apnea   cpap  Wears dentures   partial upper and lower  Wheezing  Past Surgical History: Past Surgical History: Procedure Laterality Date  BACK SURGERY    CATARACT EXTRACTION W/PHACO Right 07/31/2016  Procedure: CATARACT EXTRACTION PHACO AND INTRAOCULAR LENS PLACEMENT (IOC);  Surgeon: Adine Oneil Novak, MD;  Location: [facility] ORS;  Service: Ophthalmology;  Laterality: Right;  Lot # [id number] H US :00:26.9 AP%:6.8% CDE: 1.83  CATARACT EXTRACTION W/PHACO Left 06/24/2021  Procedure: CATARACT EXTRACTION PHACO AND INTRAOCULAR LENS PLACEMENT (IOC) LEFT DIABETIC;  Surgeon: Novak Adine Oneil, MD;  Location: [facility];  Service: Ophthalmology;  Laterality: Left;  3.35 0:23.9  COLONOSCOPY    NECK MASS EXCISION    PILONIDAL CYST EXCISION   Jordan Jarrett Clapp, MS, CCC-SLP Speech Language Pathologist Rehab Services; [facility] [phone] (ascom) Jordan J Clapp 05/16/2024, 9:38 AM  DG Abd 1 View Result Date: 05/15/2024 EXAM: 1 VIEW XRAY OF THE  ABDOMEN 05/15/2024 09:58:25 AM COMPARISON: 05/14/2024 CLINICAL HISTORY: Encounter for feeding tube placement [id number] FINDINGS: LINES, TUBES AND DEVICES: Enteric tube in place with tip at the distal stomach. BOWEL: Nonobstructive bowel gas pattern. SOFT TISSUES: No abnormal calcifications. BONES: No acute fracture. IMPRESSION: 1. Enteric tube in place with tip at the distal stomach. Electronically signed by: Waddell Calk MD 05/15/2024 10:14 AM EST RP Workstation: [id number]   DG Chest 1 View Result Date: 05/14/2024 EXAM: 1 VIEW(S) XRAY OF THE CHEST 05/14/2024 11:05:03 PM COMPARISON: None available. CLINICAL HISTORY: Abnormal lung sounds FINDINGS: LINES, TUBES AND DEVICES: Enteric tube in place with tip and side port below the hemidiaphragm. LUNGS AND PLEURA: Density is noted over the left base corresponding to the inferior aspect of 6th rib on the left. No pleural effusion. No pneumothorax. HEART AND MEDIASTINUM: Atherosclerotic plaque noted. No acute abnormality of the cardiac and mediastinal silhouettes. BONES AND SOFT TISSUES: No acute osseous abnormality. IMPRESSION: 1. No acute cardiopulmonary abnormality. 2. Enteric tube tip and side port project below the hemidiaphragm. Electronically signed by: Oneil Devonshire MD 05/14/2024 11:21 PM EST RP Workstation: [email or web address] [id number] 1 View Result Date: 05/14/2024 EXAM: 1 VIEW XRAY OF THE ABDOMEN 05/14/2024 10:10:00 PM COMPARISON: 05/14/2024 CLINICAL HISTORY: Encounter for imaging study to confirm nasogastric (NG) tube placement. FINDINGS: LINES, TUBES AND DEVICES: Enteric tube in place with tip in the distal stomach. BOWEL: Nonobstructive bowel gas pattern. SOFT TISSUES: No abnormal calcifications. BONES: No acute fracture. IMPRESSION: 1. Enteric tube in place with tip in the distal stomach. Electronically signed by: Oneil Devonshire MD 05/14/2024 10:12 PM EST RP Workstation: [email or web address] [id number] 1 View Result Date: 05/14/2024 CLINICAL DATA:  Nasogastric tube  placement. EXAM: ABDOMEN - 1 VIEW COMPARISON:  None Available. FINDINGS: Tip and side port of the enteric tube below the diaphragm in the stomach. Normal upper abdominal bowel gas pattern. IMPRESSION: Tip and side port of the enteric tube below the diaphragm in the stomach. Electronically Signed   By: Andrea Marlee HERO.D.  On: 05/14/2024 12:38   ECHOCARDIOGRAM COMPLETE BUBBLE STUDY Result Date: 05/13/2024    ECHOCARDIOGRAM REPORT   Patient Name:   ETAI COPADO Date of Exam: 05/13/2024 Medical Rec #:  [id number]     Height:       71.0 in Accession #:    [id number]    Weight:       187.6 lb Date of Birth:  June 20, 1960      BSA:          2.052 m Patient Age:    63 years      BP:           141/86 mmHg Patient Gender: M             HR:           76 bpm. Exam Location:  [facility] Procedure: 2D Echo, Cardiac Doppler, Color Doppler and Saline Contrast Bubble            Study (Both Spectral and Color Flow Doppler were utilized during            procedure). Indications:     Stroke 434.91 / I63.9  History:         Patient has no prior history of Echocardiogram examinations.                  Stroke.  Sonographer:     Ashley McNeely-Sloane Referring Phys:  [id number] MORENE BATHE Diagnosing Phys: Dwayne D Callwood MD IMPRESSIONS  1. Left ventricular ejection fraction, by estimation, is 60 to 65%. The left ventricle has normal function. The left ventricle has no regional wall motion abnormalities. There is mild left ventricular hypertrophy. Left ventricular diastolic parameters are consistent with Grade I diastolic dysfunction (impaired relaxation).  2. Right ventricular systolic function is normal. The right ventricular size is normal.  3. Left atrial size was mildly dilated.  4. The mitral valve is normal in structure. Trivial mitral valve regurgitation.  5. The aortic valve is normal in structure. Aortic valve regurgitation is trivial. Aortic valve sclerosis is present, with no evidence of aortic valve stenosis.  6. Agitated saline  contrast bubble study was negative, with no evidence of any interatrial shunt. FINDINGS  Left Ventricle: Left ventricular ejection fraction, by estimation, is 60 to 65%. The left ventricle has normal function. The left ventricle has no regional wall motion abnormalities. Strain was performed and the global longitudinal strain is indeterminate. The left ventricular internal cavity size was normal in size. There is mild left ventricular hypertrophy. Left ventricular diastolic parameters are consistent with Grade I diastolic dysfunction (impaired relaxation). Right Ventricle: The right ventricular size is normal. No increase in right ventricular wall thickness. Right ventricular systolic function is normal. Left Atrium: Left atrial size was mildly dilated. Right Atrium: Right atrial size was normal in size. Pericardium: There is no evidence of pericardial effusion. Mitral Valve: The mitral valve is normal in structure. Trivial mitral valve regurgitation. MV peak gradient, 3.4 mmHg. The mean mitral valve gradient is 2.0 mmHg. Tricuspid Valve: The tricuspid valve is normal in structure. Tricuspid valve regurgitation is trivial. Aortic Valve: The aortic valve is normal in structure. Aortic valve regurgitation is trivial. Aortic valve sclerosis is present, with no evidence of aortic valve stenosis. Aortic valve mean gradient measures 3.0 mmHg. Aortic valve peak gradient measures 4.8 mmHg. Aortic valve area, by VTI measures 2.38 cm. Pulmonic Valve: The pulmonic valve was not well visualized. Pulmonic valve regurgitation is not visualized. Aorta: The aortic root  was not well visualized. IAS/Shunts: No atrial level shunt detected by color flow Doppler. Agitated saline contrast was given intravenously to evaluate for intracardiac shunting. Agitated saline contrast bubble study was negative, with no evidence of any interatrial shunt. Additional Comments: 3D was performed not requiring image post processing on an independent  workstation and was indeterminate.  LEFT VENTRICLE PLAX 2D LVIDd:         4.40 cm     Diastology LVIDs:         4.80 cm     LV e' medial:    6.31 cm/s LV PW:         1.50 cm     LV E/e' medial:  13.5 LV IVS:        1.00 cm     LV e' lateral:   9.14 cm/s LVOT diam:     1.80 cm     LV E/e' lateral: 9.3 LV SV:         52 LV SV Index:   26 LVOT Area:     2.54 cm LV IVRT:       123 msec  LV Volumes (MOD) LV vol d, MOD A2C: 72.1 ml LV vol d, MOD A4C: 55.9 ml LV vol s, MOD A2C: 14.3 ml LV vol s, MOD A4C: 22.2 ml LV SV MOD A2C:     57.8 ml LV SV MOD A4C:     55.9 ml LV SV MOD BP:      46.1 ml RIGHT VENTRICLE             IVC RV Basal diam:  3.50 cm     IVC diam: 1.80 cm RV Mid diam:    3.20 cm RV S prime:     14.10 cm/s  PULMONARY VEINS TAPSE (M-mode): 2.0 cm      A Reversal Duration: 151.00 msec                             A Reversal Velocity: 30.50 cm/s                             Diastolic Velocity:  34.50 cm/s                             S/D Velocity:        1.30                             Systolic Velocity:   44.30 cm/s LEFT ATRIUM             Index        RIGHT ATRIUM           Index LA diam:        3.60 cm 1.75 cm/m   RA Area:     15.00 cm LA Vol (A2C):   63.8 ml 31.09 ml/m  RA Volume:   37.60 ml  18.32 ml/m LA Vol (A4C):   32.1 ml 15.64 ml/m LA Biplane Vol: 46.6 ml 22.71 ml/m  AORTIC VALVE                    PULMONIC VALVE AV Area (Vmax):    2.34 cm     PV Vmax:        0.88 m/s AV  Area (Vmean):   2.22 cm     PV Vmean:       62.600 cm/s AV Area (VTI):     2.38 cm     PV VTI:         0.195 m AV Vmax:           110.00 cm/s  PV Peak grad:   3.1 mmHg AV Vmean:          73.900 cm/s  PV Mean grad:   2.0 mmHg AV VTI:            0.220 m      RVOT Peak grad: 2 mmHg AV Peak Grad:      4.8 mmHg AV Mean Grad:      3.0 mmHg LVOT Vmax:         101.00 cm/s LVOT Vmean:        64.500 cm/s LVOT VTI:          0.206 m LVOT/AV VTI ratio: 0.94  AORTA Ao Root diam: 3.10 cm Ao Asc diam:  3.10 cm MITRAL VALVE MV Area (PHT): 3.17  cm    SHUNTS MV Area VTI:   2.08 cm    Systemic VTI:  0.21 m MV Peak grad:  3.4 mmHg    Systemic Diam: 1.80 cm MV Mean grad:  2.0 mmHg    Pulmonic VTI:  0.161 m MV Vmax:       0.92 m/s MV Vmean:      68.6 cm/s MV Decel Time: 239 msec MV E velocity: 84.90 cm/s MV A velocity: 91.20 cm/s MV E/A ratio:  0.93 Dwayne D Callwood MD Electronically signed by Cara JONETTA Lovelace MD Signature Date/Time: 05/13/2024/2:55:16 PM    Final    CT ANGIO HEAD NECK W WO CM Result Date: 05/12/2024 CLINICAL DATA:  Follow-up examination for stroke. EXAM: CT ANGIOGRAPHY HEAD AND NECK WITH AND WITHOUT CONTRAST TECHNIQUE: Multidetector CT imaging of the head and neck was performed using the standard protocol during bolus administration of intravenous contrast. Multiplanar CT image reconstructions and MIPs were obtained to evaluate the vascular anatomy. Carotid stenosis measurements (when applicable) are obtained utilizing NASCET criteria, using the distal internal carotid diameter as the denominator. RADIATION DOSE REDUCTION: This exam was performed according to the departmental dose-optimization program which includes automated exposure control, adjustment of the mA and/or kV according to patient size and/or use of iterative reconstruction technique. CONTRAST:  75mL OMNIPAQUE  IOHEXOL  350 MG/ML SOLN COMPARISON:  Comparison made with CT and MRI from 05/11/2024. FINDINGS: CT HEAD FINDINGS Brain: Patchy and confluent hypodensity involving the supratentorial cerebral white matter, consistent with chronic small vessel ischemic disease, moderate in nature. Few scatter remote lacunar infarcts noted about the deep gray nuclei, pons, and cerebellum. Previously identified small acute ischemic infarcts involving the bilateral basal ganglia noted, grossly stable from prior MRI. No acute intracranial hemorrhage. No other acute large vessel territory infarct. No mass lesion or midline shift. No hydrocephalus or extra-axial fluid collection. Vascular:  No abnormal hyperdense vessel. Skull: Scalp soft tissues demonstrate no acute finding. Calvarium intact. Sinuses/Orbits: Globes and orbital soft tissues within normal limits. Mild mucosal thickening present about the ethmoidal air cells and maxillary sinuses. No significant mastoid effusion. Other: None. Review of the MIP images confirms the above findings CTA NECK FINDINGS Aortic arch: Visualized aortic arch within normal limits for caliber. Standard 3 vessel morphology. Aortic atherosclerosis. No significant stenosis about the origin the great vessels. Right carotid system: Eccentric atheromatous plaque within the  right CCA without significant stenosis. Calcified plaque about the right carotid bulb without hemodynamically significant greater than 50% stenosis. No dissection. Left carotid system: Eccentric plaque within the left CCA without hemodynamically significant stenosis. Calcified plaque about the left carotid bulb without hemodynamically significant greater than 50% stenosis. No dissection. Vertebral arteries: Both vertebral arteries arise from subclavian arteries. Right V1 segment is duplicated with separate origins from the right subclavian artery. Focal moderate to severe stenosis noted involving the proximal left V1 segment (series 10, image 299). No dissection. Skeleton: No worrisome osseous lesions. Prior fusion at C3-C5 with C4 corpectomy. Patient is edentulous. Other neck: No other acute finding. Upper chest: Paraseptal emphysema.  No other acute finding. Review of the MIP images confirms the above findings CTA HEAD FINDINGS Anterior circulation: Atheromatous plaque about the carotid siphons bilaterally. Resultant moderate to severe stenoses about the stenoses about the cavernous segments bilaterally. A1 segments patent bilaterally. Normal anterior communicating artery complex. Atheromatous change about the ACAs bilaterally with associated moderate left A2 stenosis (series 10, image 26).  Atheromatous irregularity about the M1 segments bilaterally without high-grade stenosis. No proximal MCA branch occlusion. Distal MCA branches perfused and symmetric. Distal small vessel atheromatous irregularity noted. Posterior circulation: Right V4 segment patent without stenosis. Focal severe stenosis involving the distal left V4 segment just prior to the vertebrobasilar junction (series 10, image 143). Left PICA patent. Right PICA not seen. Mild atheromatous irregularity about the basilar without significant stenosis. Superior cerebral arteries patent bilaterally. Both PCAs primarily supplied via the basilar. Atheromatous change about the PCAs bilaterally with is resultant severe multifocal left P2 stenoses, with additional severe distal right P3 stenosis (series 13, image 22). Venous sinuses: Patent allowing for timing the contrast bolus. Anatomic variants: As above.  No aneurysm. Review of the MIP images confirms the above findings IMPRESSION: CT HEAD: 1. Stable appearance of small acute ischemic infarcts involving the bilateral basal ganglia. No associated hemorrhage or mass effect. 2. Underlying moderate chronic microvascular ischemic disease with a few scattered remote lacunar infarcts about the deep gray nuclei, pons, and cerebellum. CTA HEAD AND NECK: 1. Negative CTA for acute large vessel occlusion or other emergent finding. 2. 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 3. Moderate left A2 stenosis. 4. Severe multifocal left P2 and distal right P3 stenoses. 5. Focal moderate to severe stenosis involving the proximal left V1 segment. 6. Focal severe stenosis involving the distal left V4 segment just prior to the vertebrobasilar junction. Aortic Atherosclerosis (ICD10-I70.0) and Emphysema (ICD10-J43.9). Electronically Signed   By: Morene Hoard M.D.   On: 05/12/2024 05:34   MR BRAIN WO CONTRAST Result Date:  05/11/2024 EXAM: MRI BRAIN WITHOUT CONTRAST 05/11/2024 04:15:00 PM TECHNIQUE: Multiplanar multisequence MRI of the head/brain was performed without the administration of intravenous contrast. COMPARISON: CT head without contrast 05/11/2024. CLINICAL HISTORY: Neuro deficit, acute, stroke suspected. Slurred speech for 2 days. FINDINGS: LIMITATIONS/ARTIFACTS: The study is mildly degraded by patient motion. BRAIN AND VENTRICLES: An acute 10mm nonhemorrhagic infarct is confirmed in the lateral aspect of the thalamus and posterior limb of the left internal capsule. 2 punctate infarcts are present within the posterior limb of the right internal capsule. Moderate atrophy and confluent periventricular white matter changes are present bilaterally. These extend to the subcortical regions bilaterally. Remote lacunar infarcts are present in the inferior cerebellum bilaterally. Remote ischemic changes extend into the brainstem. No intracranial hemorrhage. No mass. No midline shift. No hydrocephalus. The sella  is unremarkable. Normal flow voids. ORBITS: No acute abnormality. SINUSES AND MASTOIDS: No acute abnormality. BONES AND SOFT TISSUES: Normal marrow signal. No acute soft tissue abnormality. IMPRESSION: 1. Acute nonhemorrhagic 10mm infarct in the lateral aspect of the thalamus and posterior limb of the left internal capsule. 2. Two acute punctate infarcts in the posterior limb of the right internal capsule. 3. Moderate atrophy and confluent periventricular white matter changes extending to the subcortical regions bilaterally. This most likely reflects the sequelae of chronic microvascular ischemia. 4. Remote lacunar infarcts in the inferior cerebellum bilaterally and remote ischemic changes extending into the brainstem. Critical value findings were called to Dr. Fernand. at 4:45pm. Electronically signed by: Lonni Necessary MD 05/11/2024 04:49 PM EST RP Workstation: [id number]   CT Cervical Spine Wo Contrast Result Date:  05/11/2024 EXAM: CT CERVICAL SPINE WITHOUT CONTRAST 05/11/2024 02:41:37 PM TECHNIQUE: CT of the cervical spine was performed without the administration of intravenous contrast. Multiplanar reformatted images are provided for review. Automated exposure control, iterative reconstruction, and/or weight based adjustment of the mA/kV was utilized to reduce the radiation dose to as low as reasonably achievable. COMPARISON: CT of the cervical spine 02/06/2021. CLINICAL HISTORY: Multiple falls. FINDINGS: BONES AND ALIGNMENT: C4 corpectomy and anterior fusion C3-C5 is stable. No acute fracture or traumatic malalignment. DEGENERATIVE CHANGES: Adjacent level disease is again noted at C5-C6. Ossification in the posterior soft tissues is stable. SOFT TISSUES: No prevertebral soft tissue swelling. IMPRESSION: 1. Stable C4 corpectomy and anterior fusion C3-5. 2. No acute findings. Electronically signed by: Lonni Necessary MD 05/11/2024 03:00 PM EST RP Workstation: [id number]   CT HEAD WO CONTRAST Result Date: 05/11/2024 EXAM: CT HEAD WITHOUT CONTRAST 05/11/2024 02:41:37 PM TECHNIQUE: CT of the head was performed without the administration of intravenous contrast. Automated exposure control, iterative reconstruction, and/or weight based adjustment of the mA/kV was utilized to reduce the radiation dose to as low as reasonably achievable. COMPARISON: None available. CLINICAL HISTORY: Slurred speech x2 days. FINDINGS: BRAIN AND VENTRICLES: No acute hemorrhage. An acute/subacute 12 mm hypodense infarct is present in the posterior limb of the left internal capsule. Regression of moderate confluent periventricular and scattered subcortical T2 hyperintensities is noted bilaterally. Remote lacunar infarcts are present in the thalamus bilaterally. Remote lacunar infarcts are present in the inferior cerebellum. Remote lacunar infarcts are present in the anterior limb of the right internal capsule and the caudate head. No  hydrocephalus. No extra-axial collection. No mass effect or midline shift. ORBITS: Bilateral lens replacements are noted. The globes and orbits are otherwise within normal limits. SINUSES: Mild mucosal thickening is present in the maxillary sinuses bilaterally. SOFT TISSUES AND SKULL: No acute soft tissue abnormality. No skull fracture. IMPRESSION: 1. Acute/subacute 12 mm hypodense infarct in the posterior limb of the left internal capsule. 2. Remote lacunar infarcts in the thalami bilaterally, inferior cerebellum, anterior limb of the right internal capsule, and caudate head. 3. Moderate chronic microvascular ischemic white matter changes. Electronically signed by: Lonni Necessary MD 05/11/2024 02:57 PM EST RP Workstation: [id number]    Microbiology: Results for orders placed or performed during the hospital encounter of 05/28/24  Resp panel by RT-PCR (RSV, Flu A&B, Covid) Anterior Nasal Swab     Status: None   Collection Time: 05/28/24  5:57 PM   Specimen: Anterior Nasal Swab  Result Value Ref Range Status   SARS Coronavirus 2 by RT PCR NEGATIVE NEGATIVE Final    Comment: (NOTE) SARS-CoV-2 target nucleic acids are NOT DETECTED.  The SARS-CoV-2 RNA is generally  detectable in upper respiratory specimens during the acute phase of infection. The lowest concentration of SARS-CoV-2 viral copies this assay can detect is 138 copies/mL. A negative result does not preclude SARS-Cov-2 infection and should not be used as the sole basis for treatment or other patient management decisions. A negative result may occur with  improper specimen collection/handling, submission of specimen other than nasopharyngeal swab, presence of viral mutation(s) within the areas targeted by this assay, and inadequate number of viral copies(<138 copies/mL). A negative result must be combined with clinical observations, patient history, and epidemiological information. The expected result is Negative.  Fact Sheet for  Patients:  bloggercourse.com  Fact Sheet for Healthcare Providers:  seriousbroker.it  This test is no t yet approved or cleared by the [place]  FDA and  has been authorized for detection and/or diagnosis of SARS-CoV-2 by FDA under an Emergency Use Authorization (EUA). This EUA will remain  in effect (meaning this test can be used) for the duration of the COVID-19 declaration under Section 564(b)(1) of the Act, 21 U.S.C.section 360bbb-3(b)(1), unless the authorization is terminated  or revoked sooner.       Influenza A by PCR NEGATIVE NEGATIVE Final   Influenza B by PCR NEGATIVE NEGATIVE Final    Comment: (NOTE) The Xpert Xpress SARS-CoV-2/FLU/RSV plus assay is intended as an aid in the diagnosis of influenza from Nasopharyngeal swab specimens and should not be used as a sole basis for treatment. Nasal washings and aspirates are unacceptable for Xpert Xpress SARS-CoV-2/FLU/RSV testing.  Fact Sheet for Patients: bloggercourse.com  Fact Sheet for Healthcare Providers: seriousbroker.it  This test is not yet approved or cleared by the [place]  FDA and has been authorized for detection and/or diagnosis of SARS-CoV-2 by FDA under an Emergency Use Authorization (EUA). This EUA will remain in effect (meaning this test can be used) for the duration of the COVID-19 declaration under Section 564(b)(1) of the Act, 21 U.S.C. section 360bbb-3(b)(1), unless the authorization is terminated or revoked.     Resp Syncytial Virus by PCR NEGATIVE NEGATIVE Final    Comment: (NOTE) Fact Sheet for Patients: bloggercourse.com  Fact Sheet for Healthcare Providers: seriousbroker.it  This test is not yet approved or cleared by the [place]  FDA and has been authorized for detection and/or diagnosis of SARS-CoV-2 by FDA under an Emergency  Use Authorization (EUA). This EUA will remain in effect (meaning this test can be used) for the duration of the COVID-19 declaration under Section 564(b)(1) of the Act, 21 U.S.C. section 360bbb-3(b)(1), unless the authorization is terminated or revoked.  Performed at [facility], [address] [place], [place] [address]   Blood Culture (routine x 2)     Status: None (Preliminary result)   Collection Time: 05/28/24  6:00 PM   Specimen: BLOOD  Result Value Ref Range Status   Specimen Description BLOOD BLOOD LEFT FOREARM  Final   Special Requests   Final    BOTTLES DRAWN AEROBIC AND ANAEROBIC Blood Culture adequate volume   Culture   Final    NO GROWTH 4 DAYS Performed at [facility], [address] [place], [place] [address]    Report Status PENDING  Incomplete  Blood Culture (routine x 2)     Status: Abnormal   Collection Time: 05/28/24  6:06 PM   Specimen: BLOOD  Result Value Ref Range Status   Specimen Description   Final    BLOOD BLOOD RIGHT FOREARM Performed at [facility], [address]  [place], [place] [address]    Special Requests   Final    BOTTLES DRAWN AEROBIC AND ANAEROBIC Blood Culture results may not be optimal due to an inadequate volume of blood received in culture bottles Performed at [facility], [address] [place], [place] [address]    Culture  Setup Time   Final    ANAEROBIC BOTTLE ONLY GRAM POSITIVE COCCI CRITICAL RESULT CALLED TO, READ BACK BY AND VERIFIED WITH: EMILY STEINBOCK @ 1537 ON 05/29/2024 BY CAF    Culture (A)  Final    STAPHYLOCOCCUS EPIDERMIDIS THE SIGNIFICANCE OF ISOLATING THIS ORGANISM FROM A SINGLE SET OF BLOOD CULTURES WHEN MULTIPLE SETS ARE DRAWN IS UNCERTAIN. PLEASE NOTIFY THE MICROBIOLOGY DEPARTMENT WITHIN ONE WEEK IF SPECIATION AND SENSITIVITIES ARE REQUIRED. Performed at [facility], [address], [place], [place] [address]    Report Status 05/31/2024 FINAL  Final  Blood  Culture ID Panel (Reflexed)     Status: Abnormal   Collection Time: 05/28/24  6:06 PM  Result Value Ref Range Status   Enterococcus faecalis NOT DETECTED NOT DETECTED Final   Enterococcus Faecium NOT DETECTED NOT DETECTED Final   Listeria monocytogenes NOT DETECTED NOT DETECTED Final   Staphylococcus species DETECTED (A) NOT DETECTED Final    Comment: CRITICAL RESULT CALLED TO, READ BACK BY AND VERIFIED WITH: EMILY STEINBOCK @ 1537 ON 05/29/2024 BY CAF    Staphylococcus aureus (BCID) NOT DETECTED NOT DETECTED Final   Staphylococcus epidermidis DETECTED (A) NOT DETECTED Final    Comment: Methicillin (oxacillin) resistant coagulase negative staphylococcus. Possible blood culture contaminant (unless isolated from more than one blood culture draw or clinical case suggests pathogenicity). No antibiotic treatment is indicated for blood  culture contaminants. CRITICAL RESULT CALLED TO, READ BACK BY AND VERIFIED WITH: EMILY STEINBOCK @ 1537 ON 05/29/2024 BY CAF    Staphylococcus lugdunensis NOT DETECTED NOT DETECTED Final   Streptococcus species NOT DETECTED NOT DETECTED Final   Streptococcus agalactiae NOT DETECTED NOT DETECTED Final   Streptococcus pneumoniae NOT DETECTED NOT DETECTED Final   Streptococcus pyogenes NOT DETECTED NOT DETECTED Final   A.calcoaceticus-baumannii NOT DETECTED NOT DETECTED Final   Bacteroides fragilis NOT DETECTED NOT DETECTED Final   Enterobacterales NOT DETECTED NOT DETECTED Final   Enterobacter cloacae complex NOT DETECTED NOT DETECTED Final   Escherichia coli NOT DETECTED NOT DETECTED Final   Klebsiella aerogenes NOT DETECTED NOT DETECTED Final   Klebsiella oxytoca NOT DETECTED NOT DETECTED Final   Klebsiella pneumoniae NOT DETECTED NOT DETECTED Final   Proteus species NOT DETECTED NOT DETECTED Final   Salmonella species NOT DETECTED NOT DETECTED Final   Serratia marcescens NOT DETECTED NOT DETECTED Final   Haemophilus influenzae NOT DETECTED NOT DETECTED Final    Neisseria meningitidis NOT DETECTED NOT DETECTED Final   Pseudomonas aeruginosa NOT DETECTED NOT DETECTED Final   Stenotrophomonas maltophilia NOT DETECTED NOT DETECTED Final   Candida albicans NOT DETECTED NOT DETECTED Final   Candida auris NOT DETECTED NOT DETECTED Final   Candida glabrata NOT DETECTED NOT DETECTED Final   Candida krusei NOT DETECTED NOT DETECTED Final   Candida parapsilosis NOT DETECTED NOT DETECTED Final   Candida tropicalis NOT DETECTED NOT DETECTED Final   Cryptococcus neoformans/gattii NOT DETECTED NOT DETECTED Final   Methicillin resistance mecA/C DETECTED (A) NOT DETECTED Final    Comment: CRITICAL RESULT CALLED TO, READ BACK BY AND VERIFIED WITH: EMILY STEINBOCK @ 1537 ON 05/29/2024 BY CAF Performed at [facility], [address]  [place], [place] [address]     Labs: CBC: Recent Labs  Lab 05/28/24 1757 05/29/24 0326 06/01/24 1220  WBC 9.2 10.2 6.1  NEUTROABS 6.0  --   --   HGB 11.7* 11.0* 11.5*  HCT 34.8* 33.1* 33.8*  MCV 85.9 86.0 85.1  PLT 363 320 340   Basic Metabolic Panel: Recent Labs  Lab 05/30/24 1649 05/31/24 0407 06/01/24 1220 06/02/24 0915 06/03/24 0428  NA 135 135 136 134* 133*  K 5.1 4.8 4.9 5.2* 4.9  CL 101 98 99 98 96*  CO2 24 24 25 27 25   GLUCOSE 171* 210* 120* 107* 218*  BUN 38* 32* 35* 36* 41*  CREATININE 0.90 1.01 1.03 1.02 1.07  CALCIUM  9.4 9.9 10.0 9.9 9.1   Liver Function Tests: Recent Labs  Lab 05/28/24 1757 05/29/24 0326 05/30/24 0457 05/31/24 0407 06/01/24 1220  AST 236* 288* 126* 116* 83*  ALT 362* 436* 299* 291* 200*  ALKPHOS 130* 127* 115 136* 127*  BILITOT 0.3 0.4 0.3 0.3 0.3  PROT 7.6 7.4 6.9 8.0 7.8  ALBUMIN 3.7 3.4* 3.2* 3.6 3.5   CBG: Recent Labs  Lab 06/02/24 2002 06/03/24 0033 06/03/24 0505 06/03/24 0838 06/03/24 1223  GLUCAP 96 192* 183* 149* 124*    Discharge time spent: greater than 30 minutes.  This record has been created using Conservation officer, historic buildings.  Errors have been sought and corrected,but may not always be located. Such creation errors do not reflect on the standard of care.   Signed: Amaryllis Dare, MD [facility] 06/03/2024 "

## 2024-06-03 NOTE — Progress Notes (Addendum)
 Nutrition Follow-up  DOCUMENTATION CODES:   Non-severe (moderate) malnutrition in context of social or environmental circumstances  INTERVENTION:   -TF via PEG:   Nepro @ 50 ml/hr   60 ml Prosource TF20 daily     200 ml free water  flush every 4 hours   Tube feeding regimen provides 2240 kcal (97% of needs), 130 grams of protein, and 872 ml of H2O. Total free water : 2072 ml daily   -RD discussed recommendations and plan of care with DM coordinator, pharmacist, RN, and MD  -ADDENDUM (1300): MD requesting transition to bolus feeds. Orders updated:   237 ml Nepro 5 times daily  115 ml free water  flush before and after each feeding administration  Tube feeding regimen provides 2100 kcal (100% of needs), 95 grams of protein, and 860 ml of H2O. Total free water : 2 L daily  NUTRITION DIAGNOSIS:   Moderate Malnutrition related to social / environmental circumstances as evidenced by mild fat depletion, moderate fat depletion, mild muscle depletion, moderate muscle depletion.  Ongoing  GOAL:   Patient will meet greater than or equal to 90% of their needs  Met with TF  MONITOR:   TF tolerance, Diet advancement  REASON FOR ASSESSMENT:   Consult Assessment of nutrition requirement/status  ASSESSMENT:   64 y.o. 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  12/28- PEG placed by GI   Reviewed I/O's: -150 ml x 24 hours and -5.3 L since admission  UOP: 1.6 L x 24 hours   Patient is NPO and receives sole source nutrition via PEG. Osmolite 1.5 infusing at goal rate of 60 ml/hr.   Case discussed with MD and pharmacist. K has improved to 4.9 today. MD reports patient continues to require daily veltassa  and is requesting specialty formula. MD is hopeful to discharge back to facility soon. Discussed plans of care and  changes with DM coordinator, RN, pharmacist, and MD  Weight has ranged from 81-82.1 kg over the past 3 days.   ADDENDUM (1245): Per TOC, plan to discharge today and SNF does not have formula available. They are requesting a 5 day supply of TF be delivered to facility at discharge. RD personally delivered supply (24 case) of Nepro TF to patient's room. RN aware.   Medications reviewed and include plavix , vitamin B-12, lovenox , melatonin, veltassa , miralax , and seroquel .   Labs reviewed: K: 4.9, CBGS: 90-183 (inpatient orders for glycemic control are 0-15 units insulin  aspart every 4 hours and 5 units lantus  daily).    Diet Order:   Diet Order             Diet NPO time specified  Diet effective now                   EDUCATION NEEDS:   Not appropriate for education at this time  Skin:  Skin Assessment: Reviewed RN Assessment  Last BM:  06/03/24 (type 6)  Height:   Ht Readings from Last 1 Encounters:  05/28/24 5' 11 (1.803 m)    Weight:   Wt Readings from Last 1 Encounters:  06/03/24 81.1 kg    Ideal Body Weight:  78.2 kg  BMI:  Body mass index is 24.94 kg/m.  Estimated Nutritional Needs:   Kcal:  2300-2500  Protein:  115-130 grams  Fluid:  2.0-2.2 L    Margery ORN, RD, LDN, CDCES  Registered Dietitian III Certified Diabetes Care and Education Specialist If unable to reach this RD, please use RD Inpatient group chat on secure chat between hours of 8am-4 pm daily

## 2024-06-03 NOTE — Plan of Care (Signed)
" °  Problem: Education: Goal: Ability to describe self-care measures that may prevent or decrease complications (Diabetes Survival Skills Education) will improve Outcome: Adequate for Discharge Goal: Individualized Educational Video(s) Outcome: Adequate for Discharge   Problem: Coping: Goal: Ability to adjust to condition or change in health will improve Outcome: Adequate for Discharge   Problem: Fluid Volume: Goal: Ability to maintain a balanced intake and output will improve 06/03/2024 1124 by Lamiah Marmol K, RN Outcome: Adequate for Discharge 06/03/2024 0938 by Freeda Leon POUR, RN Outcome: Progressing   Problem: Health Behavior/Discharge Planning: Goal: Ability to identify and utilize available resources and services will improve 06/03/2024 1124 by Freeda Leon POUR, RN Outcome: Adequate for Discharge 06/03/2024 9061 by Freeda Leon POUR, RN Outcome: Progressing Goal: Ability to manage health-related needs will improve Outcome: Adequate for Discharge   Problem: Metabolic: Goal: Ability to maintain appropriate glucose levels will improve Outcome: Adequate for Discharge   Problem: Nutritional: Goal: Maintenance of adequate nutrition will improve 06/03/2024 1124 by Freeda Leon POUR, RN Outcome: Adequate for Discharge 06/03/2024 0938 by Freeda Leon POUR, RN Outcome: Progressing Goal: Progress toward achieving an optimal weight will improve Outcome: Adequate for Discharge   Problem: Skin Integrity: Goal: Risk for impaired skin integrity will decrease Outcome: Adequate for Discharge   Problem: Tissue Perfusion: Goal: Adequacy of tissue perfusion will improve Outcome: Adequate for Discharge   Problem: Education: Goal: Knowledge of General Education information will improve Description: Including pain rating scale, medication(s)/side effects and non-pharmacologic comfort measures Outcome: Adequate for Discharge   Problem: Health Behavior/Discharge Planning: Goal: Ability to manage  health-related needs will improve Outcome: Adequate for Discharge   Problem: Clinical Measurements: Goal: Ability to maintain clinical measurements within normal limits will improve Outcome: Adequate for Discharge Goal: Will remain free from infection Outcome: Adequate for Discharge Goal: Diagnostic test results will improve Outcome: Adequate for Discharge Goal: Respiratory complications will improve Outcome: Adequate for Discharge Goal: Cardiovascular complication will be avoided Outcome: Adequate for Discharge   Problem: Activity: Goal: Risk for activity intolerance will decrease Outcome: Adequate for Discharge   Problem: Nutrition: Goal: Adequate nutrition will be maintained Outcome: Adequate for Discharge   Problem: Coping: Goal: Level of anxiety will decrease Outcome: Adequate for Discharge   Problem: Elimination: Goal: Will not experience complications related to bowel motility Outcome: Adequate for Discharge Goal: Will not experience complications related to urinary retention Outcome: Adequate for Discharge   Problem: Pain Managment: Goal: General experience of comfort will improve and/or be controlled Outcome: Adequate for Discharge   Problem: Safety: Goal: Ability to remain free from injury will improve Outcome: Adequate for Discharge   Problem: Skin Integrity: Goal: Risk for impaired skin integrity will decrease Outcome: Adequate for Discharge   Problem: Safety: Goal: Non-violent Restraint(s) Outcome: Adequate for Discharge   "

## 2024-06-03 NOTE — Plan of Care (Signed)
  Problem: Fluid Volume: Goal: Ability to maintain a balanced intake and output will improve Outcome: Progressing   Problem: Health Behavior/Discharge Planning: Goal: Ability to identify and utilize available resources and services will improve Outcome: Progressing   Problem: Nutritional: Goal: Maintenance of adequate nutrition will improve Outcome: Progressing

## 2024-06-03 NOTE — TOC Transition Note (Addendum)
 Transition of Care [facility]) - Discharge Note   Patient Details  Name: Juan Harper. MRN: [id number] Date of Birth: 1960-08-26  [facility]) CM/SW Contact:  Shasta DELENA Daring, RN Phone Number: 06/03/2024, 11:22 AM   Clinical Narrative:    Patient has discharge orders. Patient will return to Long term care at [facility].  Facility notified and report number given to RN. Notified son, Deon.   Transportation secured with [facility]. Facility and RN notified.  No additional TOC needs identified. RNCM signing off.   Final next level of care: Skilled Nursing Facility Barriers to Discharge: Barriers Resolved   Patient Goals and CMS Choice Patient states their goals for this hospitalization and ongoing recovery are:: Patient will return to LTC at [facility]          Discharge Placement                Patient to be transferred to facility by: [facility] Name of family member notified: Deon and Zeta Patient and family notified of of transfer: 06/03/24  Discharge Plan and Services Additional resources added to the After Visit Summary for                                       Social Drivers of Health (SDOH) Interventions SDOH Screenings   Food Insecurity: Patient Unable To Answer (05/29/2024)  Housing: Patient Unable To Answer (05/29/2024)  Transportation Needs: Patient Unable To Answer (05/29/2024)  Utilities: Patient Unable To Answer (05/29/2024)  Financial Resource Strain: High Risk (06/18/2023)   Received from [facility]  Social Connections: Patient Unable To Answer (05/29/2024)  Tobacco Use: High Risk (05/28/2024)     Readmission Risk Interventions     No data to display

## 2024-06-03 NOTE — Plan of Care (Signed)
 Pt is alert oriented x 3, RA. Pt repositioned self in bed. Pt had bowel movement. Tube feedings continued per order. Prn tylenol  given for generalized pain. Call button within reach.    Problem: Education: Goal: Ability to describe self-care measures that may prevent or decrease complications (Diabetes Survival Skills Education) will improve Outcome: Progressing Goal: Individualized Educational Video(s) Outcome: Progressing   Problem: Coping: Goal: Ability to adjust to condition or change in health will improve Outcome: Progressing   Problem: Fluid Volume: Goal: Ability to maintain a balanced intake and output will improve Outcome: Progressing   Problem: Health Behavior/Discharge Planning: Goal: Ability to identify and utilize available resources and services will improve Outcome: Progressing Goal: Ability to manage health-related needs will improve Outcome: Progressing   Problem: Metabolic: Goal: Ability to maintain appropriate glucose levels will improve Outcome: Progressing   Problem: Nutritional: Goal: Maintenance of adequate nutrition will improve Outcome: Progressing Goal: Progress toward achieving an optimal weight will improve Outcome: Progressing   Problem: Skin Integrity: Goal: Risk for impaired skin integrity will decrease Outcome: Progressing   Problem: Tissue Perfusion: Goal: Adequacy of tissue perfusion will improve Outcome: Progressing   Problem: Education: Goal: Knowledge of General Education information will improve Description: Including pain rating scale, medication(s)/side effects and non-pharmacologic comfort measures Outcome: Progressing   Problem: Health Behavior/Discharge Planning: Goal: Ability to manage health-related needs will improve Outcome: Progressing   Problem: Clinical Measurements: Goal: Ability to maintain clinical measurements within normal limits will improve Outcome: Progressing Goal: Will remain free from infection Outcome:  Progressing Goal: Diagnostic test results will improve Outcome: Progressing Goal: Respiratory complications will improve Outcome: Progressing Goal: Cardiovascular complication will be avoided Outcome: Progressing   Problem: Activity: Goal: Risk for activity intolerance will decrease Outcome: Progressing   Problem: Nutrition: Goal: Adequate nutrition will be maintained Outcome: Progressing   Problem: Coping: Goal: Level of anxiety will decrease Outcome: Progressing   Problem: Elimination: Goal: Will not experience complications related to bowel motility Outcome: Progressing Goal: Will not experience complications related to urinary retention Outcome: Progressing   Problem: Pain Managment: Goal: General experience of comfort will improve and/or be controlled Outcome: Progressing   Problem: Safety: Goal: Ability to remain free from injury will improve Outcome: Progressing   Problem: Skin Integrity: Goal: Risk for impaired skin integrity will decrease Outcome: Progressing   Problem: Safety: Goal: Non-violent Restraint(s) Outcome: Progressing

## 2024-06-03 NOTE — Progress Notes (Signed)
 Called [facility] spoke with [place] PEAK. Gave report earlier and called her for updates regarding tube feeds. IV removed, Pt non tele. PT has belongings. [facility] coming for pt

## 2024-06-06 LAB — CULTURE, BLOOD (ROUTINE X 2)
Culture: NO GROWTH
Special Requests: ADEQUATE
